# Patient Record
Sex: Male | Born: 1965 | Race: White | Hispanic: No | Marital: Married | Smoking: Never smoker
Health system: Southern US, Community
[De-identification: ages and names within clinical notes are randomized; demographics above are authoritative.]

## PROBLEM LIST (undated history)

## (undated) DIAGNOSIS — K219 Gastro-esophageal reflux disease without esophagitis: Secondary | ICD-10-CM

## (undated) DIAGNOSIS — Z85828 Personal history of other malignant neoplasm of skin: Secondary | ICD-10-CM

## (undated) DIAGNOSIS — Z86010 Personal history of colonic polyps: Secondary | ICD-10-CM

## (undated) DIAGNOSIS — F419 Anxiety disorder, unspecified: Secondary | ICD-10-CM

## (undated) DIAGNOSIS — Z9289 Personal history of other medical treatment: Secondary | ICD-10-CM

## (undated) DIAGNOSIS — Z9889 Other specified postprocedural states: Secondary | ICD-10-CM

## (undated) DIAGNOSIS — I10 Essential (primary) hypertension: Secondary | ICD-10-CM

## (undated) DIAGNOSIS — E785 Hyperlipidemia, unspecified: Secondary | ICD-10-CM

## (undated) DIAGNOSIS — M1990 Unspecified osteoarthritis, unspecified site: Secondary | ICD-10-CM

## (undated) DIAGNOSIS — N529 Male erectile dysfunction, unspecified: Secondary | ICD-10-CM

## (undated) DIAGNOSIS — F329 Major depressive disorder, single episode, unspecified: Secondary | ICD-10-CM

## (undated) DIAGNOSIS — K589 Irritable bowel syndrome without diarrhea: Secondary | ICD-10-CM

## (undated) DIAGNOSIS — Z85820 Personal history of malignant melanoma of skin: Secondary | ICD-10-CM

## (undated) DIAGNOSIS — K643 Fourth degree hemorrhoids: Secondary | ICD-10-CM

## (undated) DIAGNOSIS — F32A Depression, unspecified: Secondary | ICD-10-CM

## (undated) DIAGNOSIS — K5730 Diverticulosis of large intestine without perforation or abscess without bleeding: Secondary | ICD-10-CM

## (undated) DIAGNOSIS — Z860101 Personal history of adenomatous and serrated colon polyps: Secondary | ICD-10-CM

## (undated) HISTORY — DX: Major depressive disorder, single episode, unspecified: F32.9

## (undated) HISTORY — DX: Gastro-esophageal reflux disease without esophagitis: K21.9

## (undated) HISTORY — DX: Diverticulosis of large intestine without perforation or abscess without bleeding: K57.30

## (undated) HISTORY — DX: Depression, unspecified: F32.A

## (undated) HISTORY — DX: Male erectile dysfunction, unspecified: N52.9

## (undated) HISTORY — PX: OTHER SURGICAL HISTORY: SHX169

## (undated) HISTORY — PX: COLONOSCOPY: SHX174

## (undated) HISTORY — DX: Essential (primary) hypertension: I10

## (undated) HISTORY — DX: Anxiety disorder, unspecified: F41.9

## (undated) HISTORY — DX: Irritable bowel syndrome, unspecified: K58.9

## (undated) HISTORY — DX: Hyperlipidemia, unspecified: E78.5

## (undated) HISTORY — PX: MOHS SURGERY: SHX181

---

## 2000-11-04 ENCOUNTER — Encounter: Admission: RE | Admit: 2000-11-04 | Discharge: 2000-11-04 | Payer: Self-pay | Admitting: Family Medicine

## 2000-11-04 ENCOUNTER — Encounter: Payer: Self-pay | Admitting: Family Medicine

## 2006-06-09 DIAGNOSIS — Z9289 Personal history of other medical treatment: Secondary | ICD-10-CM

## 2006-06-09 HISTORY — DX: Personal history of other medical treatment: Z92.89

## 2011-04-29 ENCOUNTER — Encounter: Payer: Self-pay | Admitting: Family Medicine

## 2011-04-29 ENCOUNTER — Ambulatory Visit (INDEPENDENT_AMBULATORY_CARE_PROVIDER_SITE_OTHER): Payer: BC Managed Care – PPO | Admitting: Family Medicine

## 2011-04-29 ENCOUNTER — Other Ambulatory Visit: Payer: Self-pay | Admitting: Family Medicine

## 2011-04-29 VITALS — BP 158/85 | HR 79 | Temp 98.5°F | Ht 70.0 in | Wt 161.8 lb

## 2011-04-29 DIAGNOSIS — K642 Third degree hemorrhoids: Secondary | ICD-10-CM | POA: Insufficient documentation

## 2011-04-29 DIAGNOSIS — D72829 Elevated white blood cell count, unspecified: Secondary | ICD-10-CM

## 2011-04-29 DIAGNOSIS — K219 Gastro-esophageal reflux disease without esophagitis: Secondary | ICD-10-CM

## 2011-04-29 DIAGNOSIS — C801 Malignant (primary) neoplasm, unspecified: Secondary | ICD-10-CM

## 2011-04-29 DIAGNOSIS — R3911 Hesitancy of micturition: Secondary | ICD-10-CM

## 2011-04-29 DIAGNOSIS — C4490 Unspecified malignant neoplasm of skin, unspecified: Secondary | ICD-10-CM | POA: Insufficient documentation

## 2011-04-29 DIAGNOSIS — F341 Dysthymic disorder: Secondary | ICD-10-CM

## 2011-04-29 DIAGNOSIS — R030 Elevated blood-pressure reading, without diagnosis of hypertension: Secondary | ICD-10-CM

## 2011-04-29 DIAGNOSIS — Z0000 Encounter for general adult medical examination without abnormal findings: Secondary | ICD-10-CM

## 2011-04-29 DIAGNOSIS — R319 Hematuria, unspecified: Secondary | ICD-10-CM

## 2011-04-29 DIAGNOSIS — E782 Mixed hyperlipidemia: Secondary | ICD-10-CM | POA: Insufficient documentation

## 2011-04-29 DIAGNOSIS — K649 Unspecified hemorrhoids: Secondary | ICD-10-CM

## 2011-04-29 DIAGNOSIS — E785 Hyperlipidemia, unspecified: Secondary | ICD-10-CM | POA: Insufficient documentation

## 2011-04-29 DIAGNOSIS — F419 Anxiety disorder, unspecified: Secondary | ICD-10-CM

## 2011-04-29 DIAGNOSIS — F329 Major depressive disorder, single episode, unspecified: Secondary | ICD-10-CM

## 2011-04-29 DIAGNOSIS — K625 Hemorrhage of anus and rectum: Secondary | ICD-10-CM

## 2011-04-29 DIAGNOSIS — R61 Generalized hyperhidrosis: Secondary | ICD-10-CM

## 2011-04-29 HISTORY — DX: Unspecified hemorrhoids: K64.9

## 2011-04-29 LAB — POCT URINALYSIS DIPSTICK
Bilirubin, UA: NEGATIVE
Nitrite, UA: NEGATIVE
Protein, UA: NEGATIVE
Urobilinogen, UA: 0.2
pH, UA: 6

## 2011-04-29 LAB — GLUCOSE, POCT (MANUAL RESULT ENTRY): POC Glucose: 95

## 2011-04-29 MED ORDER — RANITIDINE HCL 300 MG PO TABS: 300.0000 mg | ORAL_TABLET | Freq: Every day | ORAL | Status: DC

## 2011-04-29 MED ORDER — ASPIRIN 81 MG PO TABS: 81.0000 mg | ORAL_TABLET | Freq: Every day | ORAL | Status: DC

## 2011-04-29 NOTE — Patient Instructions (Signed)
Preventative Care for Adults, Male A healthy lifestyle and preventative care can promote health and wellness. Preventative health guidelines for men include the following key practices:  A routine yearly physical is a good way to check with your caregiver about your health and preventative screening. It is a chance to share any concerns and updates on your health, and to receive a thorough exam.   Visit your dentist for a routine exam and preventative care every 6 months. Brush your teeth twice a day and floss once a day. Good oral hygiene prevents tooth decay and gum disease.   The frequency of eye exams is based on your age, health, family medical history, use of contact lenses, and other factors. Follow your caregiver's recommendations for frequency of eye exams.   Eat a healthy diet. Foods like vegetables, fruits, whole grains, low-fat dairy products, and lean protein foods contain the nutrients you need without too many calories. Decrease your intake of foods high in solid fats, added sugars, and salt. Eat the right amount of calories for you.Get information about a proper diet from your caregiver, if necessary.   Regular physical exercise is one of the most important things you can do for your health. Most adults should get at least 150 minutes of moderate-intensity exercise (any activity that increases your heart rate and causes you to sweat) each week. In addition, most adults need muscle-strengthening exercises on 2 or more days a week.   Maintain a healthy weight. The body mass index (BMI) is a screening tool to identify possible weight problems. It provides an estimate of body fat based on height and weight. Your caregiver can help determine your BMI, and can help you achieve or maintain a healthy weight.For adults 20 years and older:   A BMI below 18.5 is considered underweight.   A BMI of 18.5 to 24.9 is normal.   A BMI of 25 to 29.9 is considered overweight.   A BMI of 30 and  above is considered obese.   Maintain normal blood lipids and cholesterol levels by exercising and minimizing your intake of saturated fat. Eat a balanced diet with plenty of fruit and vegetables. Blood tests for lipids and cholesterol should begin at age 20 and be repeated every 5 years. If your lipid or cholesterol levels are high, you are over 50, or you are a high risk for heart disease, you may need your cholesterol levels checked more frequently.Ongoing high lipid and cholesterol levels should be treated with medicines if diet and exercise are not effective.   If you smoke, find out from your caregiver how to quit. If you do not use tobacco, do not start.   If you choose to drink alcohol, do not exceed 2 drinks per day. One drink is considered to be 12 ounces (355 mL) of beer, 5 ounces (148 mL) of wine, or 1.5 ounces (44 mL) of liquor.   Avoid use of street drugs. Do not share needles with anyone. Ask for help if you need support or instructions about stopping the use of drugs.   High blood pressure causes heart disease and increases the risk of stroke. Your blood pressure should be checked at least every 1 to 2 years. Ongoing high blood pressure should be treated with medicines, if weight loss and exercise are not effective.   If you are 45 to 47 years old, ask your caregiver if you should take aspirin to prevent heart disease.   Diabetes screening involves taking a blood   sample to check your fasting blood sugar level. This should be done once every 3 years, after age 45, if you are within normal weight and without risk factors for diabetes. Testing should be considered at a younger age or be carried out more frequently if you are overweight and have at least 1 risk factor for diabetes.   Colorectal cancer can be detected and often prevented. Most routine colorectal cancer screening begins at the age of 50 and continues through age 75. However, your caregiver may recommend screening at an  earlier age if you have risk factors for colon cancer. On a yearly basis, your caregiver may provide home test kits to check for hidden blood in the stool. Use of a small camera at the end of a tube, to directly examine the colon (sigmoidoscopy or colonoscopy), can detect the earliest forms of colorectal cancer. Talk to your caregiver about this at age 50, when routine screening begins. Direct examination of the colon should be repeated every 5 to 10 years through age 75, unless early forms of pre-cancerous polyps or small growths are found.   Practice safe sex. Use condoms and avoid high-risk sexual practices to reduce the spread of sexually transmitted infections (STIs). STIs include gonorrhea, chlamydia, syphilis, trichomonas, herpes, HPV, and human immunodeficiency virus (HIV). Herpes, HIV, and HPV are viral illnesses that have no cure. They can result in disability, cancer, and death.   A one-time screening for abdominal aortic aneurysm (AAA) and surgical repair of large AAAs by sound wave imaging (ultrasonography) is recommended for ages 65 to 75 years who are current or former smokers.   Healthy men should no longer receive prostate-specific antigen (PSA) blood tests as part of routine cancer screening. Consult with your caregiver about prostate cancer screening.   Use sunscreen with skin protection factor (SPF) of 30 or more. Apply sunscreen liberally and repeatedly throughout the day. You should seek shade when your shadow is shorter than you. Protect yourself by wearing long sleeves, pants, a wide-brimmed hat, and sunglasses year round, whenever you are outdoors.   Once a month, do a whole body skin exam, using a mirror to look at the skin on your back. Notify your caregiver of new moles, moles that have irregular borders, moles that are larger than a pencil eraser, or moles that have changed in shape or color.   Stay current with required immunizations.   Influenza. You need a dose every  fall (or winter). The composition of the flu vaccine changes each year, so being vaccinated once is not enough.   Pneumococcal polysaccharide. You need 1 to 2 doses if you smoke cigarettes or if you have certain chronic medical conditions. You need 1 dose at age 65 (or older) if you have never been vaccinated.   Tetanus, diphtheria, pertussis (Tdap, Td). Get 1 dose of Tdap vaccine if you are younger than age 65 years, are over 65 and have contact with an infant, are a healthcare worker, or simply want to be protected from whooping cough. After that, you need a Td booster dose every 10 years. Consult your caregiver if you have not had at least 3 tetanus and diphtheria-containing shots sometime in your life or have a deep or dirty wound.   HPV. This vaccine is recommended for males 13 through 47 years of age. This vaccine may be given to men 22 through 47 years of age who have not completed the 3 dose series. It is recommended for men through age 26   who have sex with men or whose immune system is weakened because of HIV infection, other illness, or medications. The vaccine is given in 3 doses over 6 months.   Measles, mumps, rubella (MMR). You need at least 1 dose of MMR if you were born in 1957 or later. You may also need a 2nd dose.   Meningococcal. If you are age 19 to 21 years and a first-year college student living in a residence hall, or have one of several medical conditions, you need to get vaccinated against meningococcal disease. You may also need additional booster doses.   Zoster (shingles). If you are age 60 years or older, you should get this vaccine.   Varicella (chickenpox). If you have never had chickenpox or you were vaccinated but received only 1 dose, talk to your caregiver to find out if you need this vaccine.   Hepatitis A. You need this vaccine if you have a specific risk factor for hepatitis A virus infection, or you simply wish to be protected from this disease. The vaccine is  usually given as 2 doses, 6 to 18 months apart.   Hepatitis B. You need this vaccine if you have a specific risk factor for hepatitis B virus infection or you simply wish to be protected from this disease. The vaccine is given in 3 doses, usually over 6 months.  Preventative Service / Frequency Ages 19 to 39  Blood pressure check.** / Every 1 to 2 years.   Lipid and cholesterol check.**/ Every 5 years beginning at age 20.   Skin self-exam. / Monthly.   Influenza immunization.** / Every year.   Pneumococcal polysaccharide immunization.** / 1 to 2 doses if you smoke cigarettes or if you have certain chronic medical conditions.   Tetanus, diphtheria, pertussis (Tdap,Td) immunization. / A one-time dose of Tdap vaccine. After that, you need a Td booster dose every 10 years.   HPV immunization. / 3 doses over 6 months, if 26 and younger.   Measles, mumps, rubella (MMR) immunization. / You need at least 1 dose of MMR if you were born in 1957 or later. You may also need a 2nd dose.   Meningococcal immunization. / 1 dose if you are age 19 to 21 years and a first-year college student living in a residence hall, or have one of several medical conditions, you need to get vaccinated against meningococcal disease. You may also need additional booster doses.   Varicella immunization. **/ Consult your caregiver.   Hepatitis A immunization. ** / Consult your caregiver. 2 doses, 6 to 18 months apart.   Hepatitis B immunization.** / Consult your caregiver. 3 doses usually over 6 months.  Ages 40 to 64  Blood pressure check.** / Every 1 to 2 years.   Lipid and cholesterol check.**/ Every 5 years beginning at age 20.   Fecal occult blood test (FOBT) of stool. / Every year beginning at age 50 and continuing until age 75. You may not have to do this test if you get colonoscopy every 10 years.   Flexible sigmoidoscopy** or colonoscopy.** / Every 5 years for a flexible sigmoidoscopy or every 10 years for  a colonoscopy beginning at age 50 and continuing until age 75.   Skin self-exam. / Monthly.   Influenza immunization.** / Every year.   Pneumococcal polysaccharide immunization.** / 1 to 2 doses if you smoke cigarettes or if you have certain chronic medical conditions.   Tetanus, diphtheria, pertussis (Tdap/Td) immunization.** / A one-time dose of   Tdap vaccine. After that, you need a Td booster dose every 10 years.   Measles, mumps, rubella (MMR) immunization. / You need at least 1 dose of MMR if you were born in 1957 or later. You may also need a 2nd dose.   Varicella immunization. **/ Consult your caregiver.   Meningococcal immunization.** / Consult your caregiver.   Hepatitis A immunization. ** / Consult your caregiver. 2 doses, 6 to 18 months apart.   Hepatitis B immunization.** / Consult your caregiver. 3 doses, usually over 6 months.  Ages 65 and over  Blood pressure check.** / Every 1 to 2 years.   Lipid and cholesterol check.**/ Every 5 years beginning at age 20.   Fecal occult blood test (FOBT) of stool. / Every year beginning at age 50 and continuing until age 75. You may not have to do this test if you get colonoscopy every 10 years.   Flexible sigmoidoscopy** or colonoscopy.** / Every 5 years for a flexible sigmoidoscopy or every 10 years for a colonoscopy beginning at age 50 and continuing until age 75.   Abdominal aortic aneurysm (AAA) screening.** / A one-time screening for ages 65 to 75 years who are current or former smokers.   Skin self-exam. / Monthly.   Influenza immunization.** / Every year.   Pneumococcal polysaccharide immunization.** / 1 dose at age 65 (or older) if you have never been vaccinated.   Tetanus, diphtheria, pertussis (Tdap, Td) immunization. / A one-time dose of Tdap vaccine if you are over 65 and have contact with an infant, are a healthcare worker, or simply want to be protected from whooping cough. After that, you need a Td booster dose  every 10 years.   Varicella immunization. **/ Consult your caregiver.   Meningococcal immunization.** / Consult your caregiver.   Hepatitis A immunization. ** / Consult your caregiver. 2 doses, 6 to 18 months apart.   Hepatitis B immunization.** / Check with your caregiver. 3 doses, usually over 6 months.  **Family history and personal history of risk and conditions may change your caregiver's recommendations. Document Released: 05/07/2001 Document Revised: 11/21/2010 Document Reviewed: 08/06/2010 [employer] Patient Information 2012 [employer]. 

## 2011-04-30 ENCOUNTER — Encounter: Payer: Self-pay | Admitting: Family Medicine

## 2011-04-30 DIAGNOSIS — R350 Frequency of micturition: Secondary | ICD-10-CM | POA: Insufficient documentation

## 2011-04-30 DIAGNOSIS — R319 Hematuria, unspecified: Secondary | ICD-10-CM | POA: Insufficient documentation

## 2011-04-30 LAB — CBC
Hemoglobin: 15.9 g/dL (ref 13.0–17.0)
MCH: 32.2 pg (ref 26.0–34.0)
Platelets: 277 10*3/uL (ref 150–400)
RBC: 4.94 MIL/uL (ref 4.22–5.81)
WBC: 11 10*3/uL — ABNORMAL HIGH (ref 4.0–10.5)

## 2011-04-30 LAB — HEPATIC FUNCTION PANEL
AST: 16 U/L (ref 0–37)
Albumin: 5.1 g/dL (ref 3.5–5.2)
Alkaline Phosphatase: 85 U/L (ref 39–117)
Total Protein: 7.5 g/dL (ref 6.0–8.3)

## 2011-04-30 LAB — LIPID PANEL
Cholesterol: 345 mg/dL — ABNORMAL HIGH (ref 0–200)
HDL: 34 mg/dL — ABNORMAL LOW (ref >39)

## 2011-04-30 LAB — SEDIMENTATION RATE: Sed Rate: 4 mm/hr (ref 0–16)

## 2011-04-30 LAB — BASIC METABOLIC PANEL
CO2: 21 mEq/L (ref 19–32)
Chloride: 101 mEq/L (ref 96–112)
Creat: 1 mg/dL (ref 0.50–1.35)
Potassium: 5.2 mEq/L (ref 3.5–5.3)
Sodium: 140 mEq/L (ref 135–145)

## 2011-04-30 LAB — H. PYLORI ANTIBODY, IGG: H Pylori IgG: 0.49 {ISR}

## 2011-04-30 MED ORDER — CIPROFLOXACIN HCL 500 MG PO TABS: 500.0000 mg | ORAL_TABLET | Freq: Two times a day (BID) | ORAL | Status: AC

## 2011-04-30 NOTE — Progress Notes (Signed)
Patient ID: Aaron Dyer, male   DOB: 1966-01-12, 46 y.o.   MRN: [id number] EVERTT CHOUINARD [id number] [birth year] 04/30/2011      Progress Note New Patient  Subjective  Chief Complaint  Chief Complaint  Patient presents with  . Establish Care    new patient    HPI  She is a 46 year old Caucasian male in today for new patient appt. He has been struggling with worsening anxiety depression for the last couple years. He was laid off and also injured his left finger in a work-related injury. He has gotten more and more depressed for work and been unable to find it. 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 He cries easily and is irritable frequently. His wife is with him and confirms. He reports having a lot of interest in activities and sense of hopelessness and uselessness but denies active suicidal ideation and or any suicide plan. He is also complaining of several weeks to several months of worsening urinary hesitancy and decreased stream. Notes some bright red blood per rectum at times not necessarily after straining. In fact he has often several loose stools daily but denies melanotic stool. Denies anorexia but does complain of some night sweats worsening over the last 5 days. No dysuria or hematuria. No chest pain, palpitations, shortness of breath. He does have reflux symptoms despite PPI.  Past Medical History  Diagnosis Date  . Chicken pox as a child  . Mumps as a child  . Anxiety and depression   . Hyperlipidemia   . Cancer     face, arms, legs, and ear, SCC  . Reflux 04/29/2011  . Rectal bleeding 04/29/2011  . Urinary hesitancy 04/30/2011  . Hematuria 04/30/2011    Past Surgical History  Procedure Date  . Remove squamous cell     b/l legs  . Mohs surgery 2010    right ear, twice    Family History  Problem Relation Age of Onset  . COPD Mother   . Emphysema Mother   . Stroke Mother     X 2  . Heart disease Mother     open heart surgery  .  Hyperlipidemia Mother   . Hypertension Mother   . Diabetes Mother     type 2  . Cancer Father     melanoma  . Stroke Father     34  . Hypertension Father   . Alcohol abuse Father   . Diabetes Maternal Grandmother     type 2  . Heart disease Maternal Grandmother     CHF  . Heart attack Maternal Grandfather   . Heart disease Paternal Grandmother   . Heart disease Paternal Grandfather     History   Social History  . Marital Status: Married    Spouse Name: N/A    Number of Children: N/A  . Years of Education: N/A   Occupational History  . Not on file.   Social History Main Topics  . Smoking status: Never Smoker   . Smokeless tobacco: Never Used  . Alcohol Use: Yes     occasionally  . Drug Use: No  . Sexually Active: Yes   Other Topics Concern  . Not on file   Social History Narrative  . No narrative on file    No current outpatient prescriptions on file prior to visit.    No Known Allergies  Review of Systems  Review of Systems  Constitutional: Negative for fever, chills  and malaise/fatigue.  HENT: Negative for hearing loss, nosebleeds and congestion.   Eyes: Negative for discharge.  Respiratory: Negative for cough, sputum production, shortness of breath and wheezing.   Cardiovascular: Negative for chest pain, palpitations and leg swelling.  Gastrointestinal: Positive for heartburn, diarrhea and blood in stool. Negative for nausea, vomiting, abdominal pain and constipation.  Genitourinary: Negative for dysuria, urgency, frequency and hematuria.       Urinary hesitancy and decreased stream over past couple of months.  Musculoskeletal: Negative for myalgias, back pain and falls.  Skin: Negative for rash.  Neurological: Negative for dizziness, tremors, sensory change, focal weakness, loss of consciousness, weakness and headaches.  Endo/Heme/Allergies: Negative for polydipsia. Does not bruise/bleed easily.  Psychiatric/Behavioral: Positive for depression.  Negative for suicidal ideas, hallucinations and substance abuse. The patient is nervous/anxious and has insomnia.     Objective  BP 158/85  Pulse 79  Temp(Src) 98.5 F (36.9 C) (Temporal)  Ht 5\' 10"  (1.778 m)  Wt 161 lb 12.8 oz (73.392 kg)  BMI 23.22 kg/m2  SpO2 98%  Physical Exam  Physical Exam  Constitutional: He is oriented to person, place, and time and well-developed, well-nourished, and in no distress. No distress.  HENT:  Head: Normocephalic and atraumatic.  Eyes: Conjunctivae are normal.  Neck: Neck supple. No thyromegaly present.  Cardiovascular: Normal rate, regular rhythm and normal heart sounds.   No murmur heard. Pulmonary/Chest: Effort normal and breath sounds normal. No respiratory distress.  Abdominal: He exhibits no distension and no mass. There is no tenderness.  Musculoskeletal: He exhibits no edema.  Neurological: He is alert and oriented to person, place, and time.  Skin: Skin is warm.  Psychiatric: Memory, affect and judgment normal.       Assessment & Plan  Hematuria Urine sent for culture  Urinary hesitancy Concern for acute prostatitis, will try treating with ciprofloxacin to help symptoms  Rectal bleeding Agrees to referral to GI for scoping with his combination of reflux and rectal bleeding he would benefit from upper and lower endoscopy  Hyperlipidemia Very hi will require medications upon return visit. Encouraged to avoid trans fats and to start a fish oil supplement  Cancer Encouraged to continue his q 6 month visits with Dr Terri Piedra  Anxiety and depression Denies suicidal ideation but hs multiple stressors including lack of work and caretaker to his ailing mother. He has established care with a psychologist and starts with a psychaitrist later this week he is encouraged to keep those appointments for now.  Reflux Will add Ranitidine to his Nexium daily while he awaits consultation with GI

## 2011-04-30 NOTE — Assessment & Plan Note (Signed)
Agrees to referral to GI for scoping with his combination of reflux and rectal bleeding he would benefit from upper and lower endoscopy

## 2011-04-30 NOTE — Assessment & Plan Note (Signed)
Will add Ranitidine to his Nexium daily while he awaits consultation with GI

## 2011-04-30 NOTE — Assessment & Plan Note (Signed)
Urine sent for culture

## 2011-04-30 NOTE — Assessment & Plan Note (Signed)
Encouraged to continue his q 6 month visits with Dr Terri Piedra

## 2011-04-30 NOTE — Assessment & Plan Note (Signed)
Concern for acute prostatitis, will try treating with ciprofloxacin to help symptoms

## 2011-04-30 NOTE — Assessment & Plan Note (Signed)
Very hi will require medications upon return visit. Encouraged to avoid trans fats and to start a fish oil supplement

## 2011-04-30 NOTE — Assessment & Plan Note (Signed)
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 He has established care with a psychologist and starts with a psychaitrist later this week he is encouraged to keep those appointments for now.

## 2011-05-01 ENCOUNTER — Telehealth: Payer: Self-pay

## 2011-05-01 LAB — URINE CULTURE: Colony Count: NO GROWTH

## 2011-05-01 NOTE — Telephone Encounter (Signed)
Patient informed and states understandment

## 2011-05-01 NOTE — Telephone Encounter (Signed)
Message copied by Court Joy on Wed May 01, 2011 11:35 AM ------      Message from: Danise Edge A      Created: Tue Apr 30, 2011  9:55 PM       I sent in a prescription for Ciprofloxacin and he should take a probiotic  For the next 3 weeks with his WBCs up, hematuria and his symptoms would treat a prostatitis and see if he improves. Please notify

## 2011-05-01 NOTE — Progress Notes (Signed)
Patient advised and states he would like to wait on the statin until he comes in for his appt? Pt also advised that his letter from [facility] is available for pickup. Pt stated his wife would be in later today to pick it up

## 2011-05-02 NOTE — Telephone Encounter (Signed)
Have him start Lovastatin 10 mg 1 tab po qhs, disp # 30 , 3 rf, check lft in 1 month and flp and lft in 3-4 months

## 2011-05-02 NOTE — Telephone Encounter (Signed)
RC from pt wife regarding earlier message.  Advised cipro is for probable prostatitis.  They are agreeable.  Also patient states they are OK with starting statin.  [facility] [address]

## 2011-05-03 MED ORDER — LOVASTATIN 10 MG PO TABS: 10.0000 mg | ORAL_TABLET | Freq: Every day | ORAL | Status: DC

## 2011-05-03 NOTE — Telephone Encounter (Signed)
Pt informed

## 2011-05-03 NOTE — Telephone Encounter (Signed)
Addended by: Court Joy on: 05/03/2011 09:32 AM   Modules accepted: Orders

## 2011-05-06 ENCOUNTER — Encounter: Payer: Self-pay | Admitting: Internal Medicine

## 2011-05-10 ENCOUNTER — Encounter: Payer: Self-pay | Admitting: Internal Medicine

## 2011-05-13 ENCOUNTER — Telehealth: Payer: Self-pay | Admitting: Family Medicine

## 2011-05-13 NOTE — Telephone Encounter (Signed)
Patient forgot to get stool sample kit at appt. Also patient wants to know if he should keep taking Welbutrin, if yes, he will need an Rx

## 2011-05-14 MED ORDER — BUPROPION HCL ER (XL) 150 MG PO TB24: 150.0000 mg | ORAL_TABLET | Freq: Every day | ORAL | Status: DC

## 2011-05-14 NOTE — Telephone Encounter (Signed)
Please advise 

## 2011-05-14 NOTE — Telephone Encounter (Signed)
He can continue the Wellbutrin 150 mg if he thinks it is helping, 1 tab po daily, #30 with 3 rf

## 2011-05-14 NOTE — Telephone Encounter (Signed)
Please have him p/u a stool kit, to check for hemeoccult

## 2011-05-14 NOTE — Telephone Encounter (Signed)
Left a detailed message on patients voicemail. Wellbutrin called into pharmacy. Hemoccult cards put at front desk for patient to pick up.

## 2011-05-15 ENCOUNTER — Ambulatory Visit (INDEPENDENT_AMBULATORY_CARE_PROVIDER_SITE_OTHER): Payer: BC Managed Care – PPO | Admitting: Internal Medicine

## 2011-05-15 ENCOUNTER — Encounter: Payer: Self-pay | Admitting: Internal Medicine

## 2011-05-15 DIAGNOSIS — K625 Hemorrhage of anus and rectum: Secondary | ICD-10-CM

## 2011-05-15 DIAGNOSIS — R1013 Epigastric pain: Secondary | ICD-10-CM

## 2011-05-15 DIAGNOSIS — K219 Gastro-esophageal reflux disease without esophagitis: Secondary | ICD-10-CM

## 2011-05-15 MED ORDER — ALIGN 4 MG PO CAPS: 1.0000 | ORAL_CAPSULE | Freq: Every day | ORAL | Status: DC

## 2011-05-15 MED ORDER — PEG-KCL-NACL-NASULF-NA ASC-C 100 G PO SOLR: 1.0000 | Freq: Once | ORAL | Status: DC

## 2011-05-15 NOTE — Progress Notes (Signed)
Subjective:    Patient ID: Aaron Dyer, male    DOB: 1965-12-24, 46 y.o.   MRN: [id number]  HPI Mr. Nhan is a 46 year old male with a past medical history of anxiety and depression, hyperlipidemia, GERD who seen in consultation at the request of Dr. Abner Greenspan for evaluation of reflux and rectal bleeding. Patient reports years of issues with heartburn and reflux disease. This continues to be an issue, on a daily basis. He reports burning epigastric and chest pain which is worse after eating. Occasionally he has nausea but no vomiting. He also occasionally has epigastric pain which is worse after eating. He has been on omeprazole 40 mg daily and recently started taking ranitidine 300 mg at bedtime. He cannot tell a significant difference in his symptoms. He does recall when he was on the brand-name, Prilosec, he tended to have an much fewer reflux symptoms. He denies dysphagia and odynophagia. No weight loss and a good appetite. He does report bowel movements which are somewhat erratic, and this has been long-standing. He does report painless bright red rectal bleeding which has occurred intermittently over the last few months. He sees this blood on the toilet tissue and occasionally in the toilet water. He denies melena. No fevers or chills  Review of Systems As per history of present illness, and notable for anxiety, depression, fatigue, muscle cramps, and difficulty sleeping, otherwise negative  Patient Active Problem List  Diagnoses  . Anxiety and depression  . Cancer - sq cell skin cancer  . Hyperlipidemia  . Reflux  . Rectal bleeding  . Urinary hesitancy  . Hematuria   Past Surgical History  Procedure Date  . Remove squamous cell     b/l legs  . Mohs surgery 2010    right ear, twice   Current Outpatient Prescriptions  Medication Sig Dispense Refill  . aspirin 81 MG tablet Take 1 tablet (81 mg total) by mouth daily.  30 tablet  0  . buPROPion (WELLBUTRIN XL) 150 MG 24 hr tablet Take  1 tablet (150 mg total) by mouth daily.  30 tablet  3  . ciprofloxacin (CIPRO) 500 MG tablet Take 500 mg by mouth 2 (two) times daily.      . fish oil-omega-3 fatty acids 1000 MG capsule Take 1 g by mouth daily.      Marland Kitchen FLUoxetine (PROZAC) 20 MG capsule Take 20 mg by mouth 3 (three) times daily.      Marland Kitchen lovastatin (MEVACOR) 10 MG tablet Take 1 tablet (10 mg total) by mouth at bedtime.  30 tablet  3  . omeprazole (PRILOSEC) 40 MG capsule Take 40 mg by mouth daily.      . ranitidine (ZANTAC) 300 MG tablet Take 1 tablet (300 mg total) by mouth at bedtime.  30 tablet  3  . temazepam (RESTORIL) 15 MG capsule Take 30 mg by mouth at bedtime as needed.      . peg 3350 powder (MOVIPREP) 100 G SOLR Take 1 kit (100 g total) by mouth once.  1 kit  0  . Probiotic Product (ALIGN) 4 MG CAPS Take 1 capsule by mouth daily.  30 capsule  0   No Known Allergies  Family History  Problem Relation Age of Onset  . COPD Mother   . Emphysema Mother   . Stroke Mother     X 2  . Heart disease Mother     open heart surgery  . Hyperlipidemia Mother   . Hypertension Mother   .  Diabetes Mother     type 2  . Cancer Father     melanoma  . Stroke Father     4  . Hypertension Father   . Alcohol abuse Father   . Diabetes Maternal Grandmother     type 2  . Heart disease Maternal Grandmother     CHF  . Heart attack Maternal Grandfather   . Heart disease Paternal Grandmother   . Heart disease Paternal Grandfather    History   Social History  . Marital Status: Married    Spouse Name: N/A    Number of Children: N/A  . Years of Education: N/A   Occupational History  . unemployed    Social History Main Topics  . Smoking status: Never Smoker   . Smokeless tobacco: Never Used  . Alcohol Use: Yes     occasionally  . Drug Use: No  . Sexually Active: Yes   Other Topics Concern  . None   Social History Narrative  . None       Objective:   Physical Exam BP 100/60  Pulse 80  Ht 5\' 9"  (1.753 m)   Wt 166 lb (75.297 kg)  BMI 24.51 kg/m2 Constitutional: Well-developed and well-nourished. No distress. HEENT: Normocephalic and atraumatic. Oropharynx is clear and moist. No oropharyngeal exudate. Conjunctivae are normal. Pupils are equal round and reactive to light. No scleral icterus. Neck: Neck supple. Trachea midline. Cardiovascular: Normal rate, regular rhythm and intact distal pulses. No M/R/G Pulmonary/chest: Effort normal and breath sounds normal. No wheezing, rales or rhonchi. Abdominal: Soft, nontender, nondistended. Bowel sounds active throughout. There are no masses palpable. No hepatosplenomegaly. Extremities: no clubbing, cyanosis, or edema Lymphadenopathy: No cervical adenopathy noted. Neurological: Alert and oriented to person place and time. Skin: Skin is warm and dry. No rashes noted. Psychiatric: Normal mood and affect. Behavior is normal.  CBC    Component Value Date/Time   WBC 11.0* 04/29/2011 1525   RBC 4.94 04/29/2011 1525   HGB 15.9 04/29/2011 1525   HCT 48.1 04/29/2011 1525   PLT 277 04/29/2011 1525   MCV 97.4 04/29/2011 1525   MCH 32.2 04/29/2011 1525   MCHC 33.1 04/29/2011 1525   RDW 14.6 04/29/2011 1525    CMP     Component Value Date/Time   NA 140 04/29/2011 1525   K 5.2 04/29/2011 1525   CL 101 04/29/2011 1525   CO2 21 04/29/2011 1525   GLUCOSE 91 04/29/2011 1525   BUN 13 04/29/2011 1525   CREATININE 1.00 04/29/2011 1525   CALCIUM 10.1 04/29/2011 1525   PROT 7.5 04/29/2011 1525   ALBUMIN 5.1 04/29/2011 1525   AST 16 04/29/2011 1525   ALT 18 04/29/2011 1525   ALKPHOS 85 04/29/2011 1525   BILITOT 0.2* 04/29/2011 1525       Assessment & Plan:   46 year old male with a past medical history of anxiety and depression, hyperlipidemia, GERD who seen in consultation at the request of Dr. Abner Greenspan for evaluation of reflux and rectal bleeding. Patient reports years of issues with heartburn and reflux disease.  1. GERD -- the patient does continue to have symptoms which are consistent with reflux  disease despite daily PPI therapy. He's recently added ranitidine at night which also doesn't seem to be helping much. Given his symptoms are long-standing and not completely responsive to PPI therapy, I recommended upper endoscopy. We discussed this test including the risks and benefits and he agrees to proceed. For now would like him to continue  with omeprazole 40 mg, but he has not been taking this 30 minutes to an hour before meal. Changing the administration of 30 minutes to one hour before meal may significantly help reduce his reflux symptoms. He will try this going forward. He can also continue to use ranitidine 300 mg as needed at bedtime for breakthrough symptoms. Further recommendations after endoscopy  2. Rectal bleeding -- given the patient's bright red rectal bleeding and his age of 43, I recommended further evaluation with colonoscopy. We discussed the risks and benefits of this test and he agrees to proceed. This may be related internal hemorrhoids, which can be treated if found at colonoscopy. He does seem that he has an erratic bowel pattern, and I recommended Align one capsule daily. He was taken another probiotic over-the-counter for the last week but has not yet been able to tell much difference.

## 2011-05-15 NOTE — Patient Instructions (Addendum)
You have been scheduled for a colonoscopy/Endoscopy with propofol. Please follow written instructions given to you at your visit today.  Please pick up your prep kit at the pharmacy within the next 1-3 days.  We have sent the following medications to your pharmacy for you to pick up at your convenience: movieprep, you have been given samples today of Align. Take 1 capsule daily.  Dr. Rhea Belton would like you to continue taking your reflux medications as directed.   Follow up with Dr. Rhea Belton 1 month after your procedure.

## 2011-05-20 ENCOUNTER — Telehealth: Payer: Self-pay | Admitting: Internal Medicine

## 2011-05-20 ENCOUNTER — Other Ambulatory Visit: Payer: Self-pay | Admitting: Gastroenterology

## 2011-05-20 DIAGNOSIS — R1013 Epigastric pain: Secondary | ICD-10-CM

## 2011-05-20 DIAGNOSIS — K219 Gastro-esophageal reflux disease without esophagitis: Secondary | ICD-10-CM

## 2011-05-20 DIAGNOSIS — K625 Hemorrhage of anus and rectum: Secondary | ICD-10-CM

## 2011-05-20 MED ORDER — PEG-KCL-NACL-NASULF-NA ASC-C 100 G PO SOLR: 1.0000 | Freq: Once | ORAL | Status: DC

## 2011-05-20 NOTE — Telephone Encounter (Signed)
Resent Moviprep to [facility] per pt's request.

## 2011-05-21 ENCOUNTER — Other Ambulatory Visit: Payer: BC Managed Care – PPO

## 2011-05-21 ENCOUNTER — Other Ambulatory Visit: Payer: Self-pay | Admitting: Gastroenterology

## 2011-05-21 DIAGNOSIS — R1013 Epigastric pain: Secondary | ICD-10-CM

## 2011-05-21 DIAGNOSIS — K625 Hemorrhage of anus and rectum: Secondary | ICD-10-CM

## 2011-05-21 DIAGNOSIS — Z1211 Encounter for screening for malignant neoplasm of colon: Secondary | ICD-10-CM

## 2011-05-21 DIAGNOSIS — K219 Gastro-esophageal reflux disease without esophagitis: Secondary | ICD-10-CM

## 2011-05-21 LAB — HEMOCCULT SLIDES (X 3 CARDS)
OCCULT 1: NEGATIVE
OCCULT 2: NEGATIVE

## 2011-05-21 MED ORDER — PEG-KCL-NACL-NASULF-NA ASC-C 100 G PO SOLR: 1.0000 | Freq: Once | ORAL | Status: DC

## 2011-05-23 ENCOUNTER — Encounter: Payer: Self-pay | Admitting: Internal Medicine

## 2011-05-23 ENCOUNTER — Ambulatory Visit (AMBULATORY_SURGERY_CENTER): Payer: BC Managed Care – PPO | Admitting: Internal Medicine

## 2011-05-23 DIAGNOSIS — K219 Gastro-esophageal reflux disease without esophagitis: Secondary | ICD-10-CM

## 2011-05-23 DIAGNOSIS — K635 Polyp of colon: Secondary | ICD-10-CM

## 2011-05-23 DIAGNOSIS — K625 Hemorrhage of anus and rectum: Secondary | ICD-10-CM

## 2011-05-23 DIAGNOSIS — D126 Benign neoplasm of colon, unspecified: Secondary | ICD-10-CM

## 2011-05-23 DIAGNOSIS — K2960 Other gastritis without bleeding: Secondary | ICD-10-CM

## 2011-05-23 MED ORDER — SODIUM CHLORIDE 0.9 % IV SOLN: 500.0000 mL | INTRAVENOUS | Status: DC

## 2011-05-23 NOTE — Progress Notes (Signed)
Patient did not experience any of the following events: a burn prior to discharge; a fall within the facility; wrong site/side/patient/procedure/implant event; or a hospital transfer or hospital admission upon discharge from the facility. (G8907) Patient did not have preoperative order for IV antibiotic SSI prophylaxis. (G8918)  

## 2011-05-23 NOTE — Patient Instructions (Signed)

## 2011-05-23 NOTE — Op Note (Signed)
[facility] [address] [place], [place]  [address]  COLONOSCOPY PROCEDURE REPORT  PATIENT:  Aaron Dyer, Aaron Dyer  MR#:  [id number] BIRTHDATE:  1965-11-21, 45 yrs. old  GENDER:  male ENDOSCOPIST:  Carie Caddy. Lodema Parma, MD REF. BY:  Reuel Derby, M.D. PROCEDURE DATE:  05/23/2011 PROCEDURE:  Colonoscopy with snare polypectomy, Colon with cold biopsy polypectomy ASA CLASS:  Class II INDICATIONS:  rectal bleeding MEDICATIONS:   MAC sedation, administered by CRNA, propofol (Diprivan) 450 mg IV  DESCRIPTION OF PROCEDURE:   After the risks benefits and alternatives of the procedure were thoroughly explained, informed consent was obtained.  Digital rectal exam was performed and revealed external hemorrhoids.   The LB 180AL [id number] endoscope was introduced through the anus and advanced to the cecum, which was identified by both the appendix and ileocecal valve, without limitations.  The quality of the prep was good, using MoviPrep. The instrument was then slowly withdrawn as the colon was fully examined. <<PROCEDUREIMAGES>>  FINDINGS:  A 5 mm sessile polyp was found in the recto-sigmoid colon. Polyp was snared without cautery. Retrieval was successful. An 8 mm sessile polyp was found in the rectum. Polyp was snared, then cauterized with monopolar cautery. Retrieval was successful. Three sessile polyps measuring 2 - 4 mm were found in the transverse colon (1) and descending colon (2). The polyps were removed using cold biopsy forceps.  Small internal hemorrhoids were found.   Retroflexed views in the rectum revealed no other findings other than those already described.  The scope was then withdrawn  from the cecum and the procedure completed.  COMPLICATIONS:  None  ENDOSCOPIC IMPRESSION: 1) Sessile polyp in the recto-sigmoid colon. Removed and sent to pathology 2) Sessile polyp in the rectum. Removed and sent to pathology 3) Three polyps in the transverse colon. Removed and sent  to pathology 4) Small external and internal hemorrhoids  RECOMMENDATIONS: 1) Hold aspirin, aspirin products, and anti-inflammatory medication for 2 weeks. 2) Await pathology results 3) If the polyps removed today are proven to be adenomatous (pre-cancerous) polyps, you will need a colonoscopy in 3 years. Otherwise you should continue to follow colorectal cancer screening guidelines for "routine risk" patients with a colonoscopy in 10 years. You will receive a letter within 1-2 weeks with the results of your biopsy as well as final recommendations. Please call my office if you have not received a letter after 3 weeks.  Carie Caddy. Rhea Belton, MD  CC:  Reuel Derby, MD The Patient  n. eSIGNEDCarie Caddy. Absalom Aro at 05/23/2011 05:06 PM  Herbie Drape, [phone]

## 2011-05-23 NOTE — Op Note (Signed)
[facility] [address] [place], [place]  [address]  ENDOSCOPY PROCEDURE REPORT  PATIENT:  Aaron, Dyer  MR#:  [id number] BIRTHDATE:  [birth year], 45 yrs. old  GENDER:  male ENDOSCOPIST:  Carie Caddy. Eduar Kumpf, MD Referred by:  Reuel Derby, M.D. PROCEDURE DATE:  05/23/2011 PROCEDURE:  EGD with biopsy, 432[address] ASA CLASS:  Class II INDICATIONS:  dyspepsia, epigastric pain MEDICATIONS:    MAC sedation, administered by CRNA, propofol (Diprivan) 450 mg IV TOPICAL ANESTHETIC:  none  DESCRIPTION OF PROCEDURE:   After the risks benefits and alternatives of the procedure were thoroughly explained, informed consent was obtained.  The LB [id number] [id number] endoscope was introduced through the mouth and advanced to the second portion of the duodenum, without limitations.  The instrument was slowly withdrawn as the mucosa was fully examined. <<PROCEDUREIMAGES>>  The esophagus and gastroesophageal junction were completely normal in appearance.  Mild gastritis was found antrum. Biopsies of the antrum and body of the stomach were obtained and sent to pathology.  The duodenal bulb was normal in appearance, as was the postbulbar duodenum.    Retroflexed views revealed no abnormalities.    The scope was then withdrawn from the patient and the procedure completed.  COMPLICATIONS:  None  ENDOSCOPIC IMPRESSION: 1) Normal esophagus 2) Mild gastritis in the antrum.  Biopsies performed and sent to pathology. 3) Normal duodenum  RECOMMENDATIONS: 1) Await pathology results 2) Continue current medications. 3) Follow-up of helicobacter pylori status, treat if indicated 4) Office follow-up in 1 month  Alvie Speltz M. Rhea Belton, MD  CC:  Reuel Derby, MD The Patient  n. eSIGNEDCarie Caddy. Lochlyn Zullo at 05/23/2011 04:46 PM  Aaron Dyer, [id number]

## 2011-05-24 ENCOUNTER — Encounter: Payer: Self-pay | Admitting: *Deleted

## 2011-05-24 ENCOUNTER — Telehealth: Payer: Self-pay | Admitting: *Deleted

## 2011-05-24 NOTE — Telephone Encounter (Signed)
Left message on number given in admitting yesterday as instructed. ewm

## 2011-05-27 ENCOUNTER — Ambulatory Visit: Payer: BC Managed Care – PPO | Admitting: Family Medicine

## 2011-05-28 ENCOUNTER — Encounter: Payer: Self-pay | Admitting: Family Medicine

## 2011-05-28 ENCOUNTER — Ambulatory Visit (INDEPENDENT_AMBULATORY_CARE_PROVIDER_SITE_OTHER): Payer: BC Managed Care – PPO | Admitting: Family Medicine

## 2011-05-28 VITALS — BP 139/85 | HR 77 | Temp 99.0°F | Ht 69.0 in | Wt 165.0 lb

## 2011-05-28 DIAGNOSIS — K635 Polyp of colon: Secondary | ICD-10-CM

## 2011-05-28 DIAGNOSIS — Z79899 Other long term (current) drug therapy: Secondary | ICD-10-CM

## 2011-05-28 DIAGNOSIS — IMO0001 Reserved for inherently not codable concepts without codable children: Secondary | ICD-10-CM

## 2011-05-28 DIAGNOSIS — C4490 Unspecified malignant neoplasm of skin, unspecified: Secondary | ICD-10-CM

## 2011-05-28 DIAGNOSIS — E785 Hyperlipidemia, unspecified: Secondary | ICD-10-CM

## 2011-05-28 DIAGNOSIS — F329 Major depressive disorder, single episode, unspecified: Secondary | ICD-10-CM

## 2011-05-28 DIAGNOSIS — C801 Malignant (primary) neoplasm, unspecified: Secondary | ICD-10-CM

## 2011-05-28 DIAGNOSIS — D126 Benign neoplasm of colon, unspecified: Secondary | ICD-10-CM

## 2011-05-28 DIAGNOSIS — D72829 Elevated white blood cell count, unspecified: Secondary | ICD-10-CM

## 2011-05-28 DIAGNOSIS — K219 Gastro-esophageal reflux disease without esophagitis: Secondary | ICD-10-CM

## 2011-05-28 DIAGNOSIS — K625 Hemorrhage of anus and rectum: Secondary | ICD-10-CM

## 2011-05-28 DIAGNOSIS — R319 Hematuria, unspecified: Secondary | ICD-10-CM

## 2011-05-28 DIAGNOSIS — R3911 Hesitancy of micturition: Secondary | ICD-10-CM

## 2011-05-28 DIAGNOSIS — F32A Depression, unspecified: Secondary | ICD-10-CM

## 2011-05-28 DIAGNOSIS — G4700 Insomnia, unspecified: Secondary | ICD-10-CM | POA: Insufficient documentation

## 2011-05-28 DIAGNOSIS — F419 Anxiety disorder, unspecified: Secondary | ICD-10-CM

## 2011-05-28 DIAGNOSIS — F341 Dysthymic disorder: Secondary | ICD-10-CM

## 2011-05-28 LAB — HEPATIC FUNCTION PANEL
Alkaline Phosphatase: 79 U/L (ref 39–117)
Bilirubin, Direct: 0.1 mg/dL (ref 0.0–0.3)
Total Protein: 7.5 g/dL (ref 6.0–8.3)

## 2011-05-28 LAB — POCT URINALYSIS DIPSTICK
Protein, UA: NEGATIVE
Spec Grav, UA: 1.02
Urobilinogen, UA: 0.2
pH, UA: 7.5

## 2011-05-28 LAB — CBC
Hemoglobin: 16 g/dL (ref 13.0–17.0)
MCHC: 34 g/dL (ref 30.0–36.0)
MCV: 95.1 fl (ref 78.0–100.0)
Platelets: 244 10*3/uL (ref 150.0–400.0)

## 2011-05-28 MED ORDER — RANITIDINE HCL 300 MG PO TABS: 300.0000 mg | ORAL_TABLET | Freq: Every day | ORAL | Status: DC

## 2011-05-28 MED ORDER — TEMAZEPAM 15 MG PO CAPS
30.0000 mg | ORAL_CAPSULE | Freq: Every evening | ORAL | Status: DC | PRN
Start: 2011-05-28 — End: 2011-09-12

## 2011-05-28 MED ORDER — FLUOXETINE HCL 20 MG PO CAPS: 20.0000 mg | ORAL_CAPSULE | Freq: Three times a day (TID) | ORAL | Status: DC

## 2011-05-28 NOTE — Assessment & Plan Note (Signed)
New lesion on left thigh, referred for further evaluation and excision

## 2011-05-28 NOTE — Assessment & Plan Note (Signed)
Colonoscopy revealed numerous none cancerous.

## 2011-05-28 NOTE — Patient Instructions (Signed)
Cholesterol Cholesterol is a white, waxy, fat-like protein needed by your body in small amounts. The liver makes all the cholesterol you need. It is carried from the liver by the blood through the blood vessels. Deposits (plaque) may build up on blood vessel walls. This makes the arteries narrower and stiffer. Plaque increases the risk for heart attack and stroke. You cannot feel your cholesterol level even if it is very high. The only way to know is by a blood test to check your lipid (fats) levels. Once you know your cholesterol levels, you should keep a record of the test results. Work with your caregiver to to keep your levels in the desired range. WHAT THE RESULTS MEAN:  Total cholesterol is a rough measure of all the cholesterol in your blood.   LDL is the so-called bad cholesterol. This is the type that deposits cholesterol in the walls of the arteries. You want this level to be low.   HDL is the good cholesterol because it cleans the arteries and carries the LDL away. You want this level to be high.   Triglycerides are fat that the body can either burn for energy or store. High levels are closely linked to heart disease.  DESIRED LEVELS:  Total cholesterol below 200.   LDL below 100 for people at risk, below 70 for very high risk.   HDL above 50 is good, above 60 is best.   Triglycerides below 150.  HOW TO LOWER YOUR CHOLESTEROL:  Diet.   Choose fish or white meat chicken and [place], roasted or baked. Limit fatty cuts of red meat, fried foods, and processed meats, such as sausage and lunch meat.   Eat lots of fresh fruits and vegetables. Choose whole grains, beans, pasta, potatoes and cereals.   Use only small amounts of olive, corn or canola oils. Avoid butter, mayonnaise, shortening or palm kernel oils. Avoid foods with trans-fats.   Use skim/nonfat milk and low-fat/nonfat yogurt and cheeses. Avoid whole milk, cream, ice cream, egg yolks and cheeses. Healthy desserts include  angel food cake, ginger snaps, animal crackers, hard candy, popsicles, and low-fat/nonfat frozen yogurt. Avoid pastries, cakes, pies and cookies.   Exercise.   A regular program helps decrease LDL and raises HDL.   Helps with weight control.   Do things that increase your activity level like gardening, walking, or taking the stairs.   Medication.   May be prescribed by your caregiver to help lowering cholesterol and the risk for heart disease.   You may need medicine even if your levels are normal if you have several risk factors.  HOME CARE INSTRUCTIONS   Follow your diet and exercise programs as suggested by your caregiver.   Take medications as directed.   Have blood work done when your caregiver feels it is necessary.  MAKE SURE YOU:   Understand these instructions.   Will watch your condition.   Will get help right away if you are not doing well or get worse.  Document Released: 12/04/2000 Document Revised: 02/28/2011 Dument Reviewed: 05/27/2007 ExitCare Patient Information 2012 [place], [place].  Minimize simple carbs and saturated fats (animal), no trans fats/partially hydrogenated oils, in breads, chips, fast food  Start a fatty acid like MegaRed caps 1 daily

## 2011-05-28 NOTE — Assessment & Plan Note (Signed)
Temazepam refill given today

## 2011-05-28 NOTE — Assessment & Plan Note (Signed)
Had upper endoscopy which confirmed gastritis, patient now taking a PPI routinely and doing better symptomatically

## 2011-05-28 NOTE — Assessment & Plan Note (Addendum)
Patient would like to consider a new counselor he is given paper work to consider [employer]. He agrees to contact them. Tolerating Wellbutrin, Fluoxetine, and Lithium no change in therapy today

## 2011-05-28 NOTE — Assessment & Plan Note (Signed)
No further episodes since his colonoscopy

## 2011-05-28 NOTE — Progress Notes (Signed)
Patient ID: Aaron Dyer, male   DOB: [birth year], 46 y.o.   MRN: [id number] Aaron Dyer [id number] 1965/12/02 05/28/2011      Progress Note-Follow Up  Subjective  Chief Complaint  Chief Complaint  Patient presents with  . Follow-up    1 month follow up    HPI  Patient is  45eaol [employer] in today for followup on his new patient appointment.since he was last seen he had an upper endos multiple polyps were found. He's had no further rectal bleeding since then. His upper endoscopy revealed gastritis. His reflux symptoms are improved on current medications. He has hadn't been commented last 2 nights has been out of Restoril. He is not currently having with his counselor and would like to try a different group his feelings of his medicines are helping or not. He denies suicidal ideation or significant anhedonia. He is also complaining of urinary hesitancy and difficulty initiating flow. Denies dysuria, leg pain, abdominal pain, fevers, chills, constipation, diarrhea, chest pain, polyp, shortness of breath.   Past Medical History  Diagnosis Date  . Chicken pox as a child  . Mumps as a child  . Anxiety and depression   . Hyperlipidemia   . Cancer     face, arms, legs, and ear, SCC  . Reflux 04/29/2011  . Rectal bleeding 04/29/2011  . Urinary hesitancy 04/30/2011  . Hematuria 04/30/2011  . IBS (irritable bowel syndrome)   . Depression   . GERD (gastroesophageal reflux disease)   . Colon polyps 05/28/2011  . Insomnia 05/28/2011    Past Surgical History  Procedure Date  . Remove squamous cell     b/l legs  . Mohs surgery 2010    right ear, twice    Family History  Problem Relation Age of Onset  . COPD Mother   . Emphysema Mother   . Stroke Mother     X 2  . Heart disease Mother     open heart surgery  . Hyperlipidemia Mother   . Hypertension Mother   . Diabetes Mother     type 2  . Cancer Father     melanoma  . Stroke Father     39  . Hypertension Father   .  Alcohol abuse Father   . Diabetes Maternal Grandmother     type 2  . Heart disease Maternal Grandmother     CHF  . Heart attack Maternal Grandfather   . Heart disease Paternal Grandmother   . Heart disease Paternal Grandfather     History   Social History  . Marital Status: Married    Spouse Name: N/A    Number of Children: N/A  . Years of Education: N/A   Occupational History  . unemployed    Social History Main Topics  . Smoking status: Never Smoker   . Smokeless tobacco: Never Used  . Alcohol Use: Yes     occasionally  . Drug Use: No  . Sexually Active: Yes   Other Topics Concern  . Not on file   Social History Narrative  . No narrative on file    Current Outpatient Prescriptions on File Prior to Visit  Medication Sig Dispense Refill  . buPROPion (WELLBUTRIN XL) 150 MG 24 hr tablet Take 1 tablet (150 mg total) by mouth daily.  30 tablet  3  . fish oil-omega-3 fatty acids 1000 MG capsule Take 1 g by mouth daily.      Marland Kitchen lovastatin (MEVACOR) 10 MG tablet  Take 1 tablet (10 mg total) by mouth at bedtime.  30 tablet  3  . omeprazole (PRILOSEC) 40 MG capsule Take 40 mg by mouth daily.      . Probiotic Product (ALIGN) 4 MG CAPS Take 1 capsule by mouth daily.  30 capsule  0    No Known Allergies  Review of Systems  Review of Systems  Constitutional: Negative for fever and malaise/fatigue.  HENT: Negative for congestion.   Eyes: Negative for discharge.  Respiratory: Negative for shortness of breath.   Cardiovascular: Negative for chest pain, palpitations and leg swelling.  Gastrointestinal: Positive for heartburn. Negative for nausea, abdominal pain, diarrhea, constipation, blood in stool and melena.  Genitourinary: Negative for dysuria, urgency, frequency, hematuria and flank pain.  Musculoskeletal: Negative for falls.  Skin: Negative for rash.  Neurological: Negative for loss of consciousness and headaches.  Endo/Heme/Allergies: Negative for polydipsia.    Psychiatric/Behavioral: Negative for depression, suicidal ideas, hallucinations and substance abuse. The patient is nervous/anxious and has insomnia.     Objective  BP 139/85  Pulse 77  Temp(Src) 99 F (37.2 C) (Temporal)  Ht 5\' 9"  (1.753 m)  Wt 165 lb (74.844 kg)  BMI 24.37 kg/m2  SpO2 99%  Physical Exam  Physical Exam  Constitutional: He is oriented to person, place, and time and well-developed, well-nourished, and in no distress. No distress.  HENT:  Head: Normocephalic and atraumatic.  Eyes: Conjunctivae are normal.  Neck: Neck supple. No thyromegaly present.  Cardiovascular: Normal rate, regular rhythm and normal heart sounds.   No murmur heard. Pulmonary/Chest: Effort normal and breath sounds normal. No respiratory distress.  Abdominal: He exhibits no distension and no mass. There is no tenderness.  Musculoskeletal: He exhibits no edema.  Neurological: He is alert and oriented to person, place, and time.  Skin: Skin is warm.  Psychiatric: Memory, affect and judgment normal.    Lab Results  Component Value Date   TSH 5.062* 04/29/2011   Lab Results  Component Value Date   WBC 9.8 05/28/2011   HGB 16.0 05/28/2011   HCT 47.2 05/28/2011   MCV 95.1 05/28/2011   PLT 244.0 05/28/2011   Lab Results  Component Value Date   CREATININE 1.00 04/29/2011   BUN 13 04/29/2011   NA 140 04/29/2011   K 5.2 04/29/2011   CL 101 04/29/2011   CO2 21 04/29/2011   Lab Results  Component Value Date   ALT 31 05/28/2011   AST 23 05/28/2011   ALKPHOS 79 05/28/2011   BILITOT 0.3 05/28/2011   Lab Results  Component Value Date   CHOL 345* 04/29/2011   Lab Results  Component Value Date   HDL 34* 04/29/2011   Lab Results  Component Value Date   LDLCALC 239* 04/29/2011   Lab Results  Component Value Date   TRIG 358* 04/29/2011   Lab Results  Component Value Date   CHOLHDL 10.1 04/29/2011     Assessment & Plan  Rectal bleeding No further episodes since his colonoscopy  Reflux Had upper endoscopy  which confirmed gastritis, patient now taking a PPI routinely and doing better symptomatically  Urinary hesitancy Patient is reporting trouble with initiating his urination at times. Also hematuria noted twice now and urine culture was negative. Will refer to urology for further evaluation  Cancer New lesion on left thigh, referred for further evaluation and excision  Colon polyps Colonoscopy revealed numerous none cancerous.   Hyperlipidemia Very hi, encouraged to avoid trans fats, minimize simple carbs and  saturated fats. Start MegaRed. Is tolerating Lovastatin. Will repeat lipid panel in 2 months.  Anxiety and depression Patient would like to consider a new counselor he is given paper work to consider [employer]. He agrees to contact them. Tolerating Wellbutrin, Fluoxetine, and Lithium no change in therapy today  Insomnia Temazepam refill given today

## 2011-05-28 NOTE — Assessment & Plan Note (Signed)
Patient is reporting trouble with initiating his urination at times. Also hematuria noted twice now and urine culture was negative. Will refer to urology for further evaluation

## 2011-05-28 NOTE — Assessment & Plan Note (Signed)
Very hi, encouraged to avoid trans fats, minimize simple carbs and saturated fats. Start MegaRed. Is tolerating Lovastatin. Will repeat lipid panel in 2 months.

## 2011-05-29 ENCOUNTER — Encounter: Payer: Self-pay | Admitting: Internal Medicine

## 2011-06-10 ENCOUNTER — Other Ambulatory Visit: Payer: Self-pay | Admitting: Dermatology

## 2011-06-27 ENCOUNTER — Encounter: Payer: Self-pay | Admitting: Internal Medicine

## 2011-07-01 ENCOUNTER — Ambulatory Visit: Payer: BC Managed Care – PPO | Admitting: Internal Medicine

## 2011-07-11 ENCOUNTER — Other Ambulatory Visit: Payer: Self-pay | Admitting: Dermatology

## 2011-07-29 ENCOUNTER — Ambulatory Visit (INDEPENDENT_AMBULATORY_CARE_PROVIDER_SITE_OTHER): Payer: BC Managed Care – PPO | Admitting: Family Medicine

## 2011-07-29 ENCOUNTER — Encounter: Payer: Self-pay | Admitting: Family Medicine

## 2011-07-29 VITALS — BP 132/78 | HR 77 | Temp 99.2°F | Ht 69.0 in | Wt 164.8 lb

## 2011-07-29 DIAGNOSIS — F329 Major depressive disorder, single episode, unspecified: Secondary | ICD-10-CM

## 2011-07-29 DIAGNOSIS — K635 Polyp of colon: Secondary | ICD-10-CM

## 2011-07-29 DIAGNOSIS — C801 Malignant (primary) neoplasm, unspecified: Secondary | ICD-10-CM

## 2011-07-29 DIAGNOSIS — F341 Dysthymic disorder: Secondary | ICD-10-CM

## 2011-07-29 DIAGNOSIS — E785 Hyperlipidemia, unspecified: Secondary | ICD-10-CM

## 2011-07-29 DIAGNOSIS — R319 Hematuria, unspecified: Secondary | ICD-10-CM

## 2011-07-29 DIAGNOSIS — R5383 Other fatigue: Secondary | ICD-10-CM

## 2011-07-29 DIAGNOSIS — D126 Benign neoplasm of colon, unspecified: Secondary | ICD-10-CM

## 2011-07-29 DIAGNOSIS — E079 Disorder of thyroid, unspecified: Secondary | ICD-10-CM

## 2011-07-29 DIAGNOSIS — D72829 Elevated white blood cell count, unspecified: Secondary | ICD-10-CM

## 2011-07-29 LAB — CBC
HCT: 44.1 % (ref 39.0–52.0)
Hemoglobin: 15.3 g/dL (ref 13.0–17.0)
MCHC: 34.7 g/dL (ref 30.0–36.0)

## 2011-07-29 LAB — RENAL FUNCTION PANEL
BUN: 16 mg/dL (ref 6–23)
Chloride: 100 mEq/L (ref 96–112)
Creat: 1.02 mg/dL (ref 0.50–1.35)
Glucose, Bld: 83 mg/dL (ref 70–99)
Phosphorus: 3.6 mg/dL (ref 2.3–4.6)

## 2011-07-29 LAB — LIPID PANEL
LDL Cholesterol: 119 mg/dL — ABNORMAL HIGH (ref 0–99)
Total CHOL/HDL Ratio: 6.7 Ratio

## 2011-07-29 LAB — TSH: TSH: 2.343 u[IU]/mL (ref 0.350–4.500)

## 2011-07-29 NOTE — Assessment & Plan Note (Signed)
Seen by urology and concerning pathology found here

## 2011-07-29 NOTE — Assessment & Plan Note (Signed)
Still struggling. No Suicidal ideation but unable to tolerate even mildly stressful situations outside his home. He is on mulitple meds and unable to keep his stress under control. Could not tolerate a stressful work situation at this time. He is in the process with setting up with a counseling situation if he is not better after that may need to consider further medication changes

## 2011-07-29 NOTE — Assessment & Plan Note (Signed)
Tolerating Lovastatin but did have some recent muscle cramps in his right hand and left arm. Encouraged to hydrate better, continue fish oil and add CoQ10 if no improvement and cramps recur may need to change statins

## 2011-07-29 NOTE — Assessment & Plan Note (Signed)
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

## 2011-07-29 NOTE — Assessment & Plan Note (Signed)
Has had upper and lower endoscopy performed which showed some gastritis and 5 polyps. He will need a repeat colonoscopy in 3 years or sooner if symptoms recur. Has not had any further rectal bleeding since right after his procedure. Encouraged better hydration and good fiber intake

## 2011-07-29 NOTE — Patient Instructions (Signed)
Squamous Cell Carcinoma   Squamous cell carcinoma is the second most common form of skin cancer. It begins in the squamous cells in the outer layer of the skin (epidermis).   CAUSES   Ultraviolet light exposure is the most common cause of squamous cell carcinoma. This may come from sunlight or tanning beds. Squamous cell carcinoma is most common in sun-exposed areas like the face, neck, arms, and hands. However, squamous cell carcinoma can occur anywhere on the body, including the lips, inside the mouth, the legs, sites of long-term (chronic) scarring, and the anus.   Other causes of squamous cell carcinoma can include:  Exposure to arsenic.   Exposure to radiation.   Exposure to toxic tars and oils.  RISK FACTORS Factors that increase your risk for squamous cell carcinoma include:  Having fair skin.   Being middle-aged or elderly.   Heavy sun exposure, especially during childhood.   Repeated sunburns.   Use of tanning beds.   A weakened immune system. This includes patients who have received a transplant and patients with human immunodeficiency virus (HIV) or acquired immunodeficency syndrome (AIDS).   Human papillomavirus infection.   Conditions that cause chronic scarring. This can include burn scars, chronic ulcers, heat (thermal) injuries, and radiation.   Exposure to psoralen plus ultraviolet A light therapy.   Exposure to chemical carcinogens, such as tar, soot, and arsenic.   Chronic, inflammatory conditions such as lupus, lichen planus, or lichen sclerosus.   Chronic infections, such as infections of the bone (osteomyelitis).   Smoking.  SYMPTOMS   Squamous cell carcinoma often starts as small, skin-colored (pink or brown) sandpaper-like growths. These growths are called solar keratoses or actinic keratoses. These growths are often more easily felt than seen.   DIAGNOSIS   Your caregiver may be able to tell what is wrong by doing a physical exam. Often, a tissue  sample is also taken. The tissue sample is examined under a microscope.   TREATMENT   The treatment for squamous cell carcinoma depends on the size and location of the tumors, as well as your overall health. Possible treatments include:    Mohs surgery. This is a procedure done by a skin doctor (dermatologist or Mohs surgeon) in his or her office. The cancerous cells are removed layer by layer.   Laser surgery to remove the tumor.   Freezing the tumor with liquid nitrogen (cryosurgery).   Radiation. This may be used for tumors on the face.   Electrodesiccation and curettage. This involves alternately scraping and burning the tumor, using an electric current to control bleeding.  If treated soon enough, squamous cell carcinoma rarely spreads to other areas of the body (metastasizes). If left untreated, however, squamous cell carcinoma will destroy the nearby tissues. This can result in the loss of a nose or ear. PREVENTION  Avoid the sun between 10:00 am and 4:00 pm when it is the strongest.   Use a sunscreen or sunblock with sun protection factor 30 or greater.   Apply sunscreen at least 30 minutes before exposure to the sun.   Reapply sunscreen every 2 to 4 hours while you are outside, after swimming, and after excessive sweating.   Always wear protective hats, clothing, and sunglasses with ultraviolet protection.   Avoid tanning beds.  HOME CARE INSTRUCTIONS    Avoid unprotected sun exposure.   Do not smoke.   Follow your caregiver's instructions for self-exams. Look for new growths or changes in your skin.   Keep   all follow-up appointments as directed by your caregiver.  SEEK MEDICAL CARE IF:    You notice any new growths or changes in your skin.   You have had a squamous cell carcinoma tumor removed and you notice a new growth in the same location.  Document Released: 09/15/2002 Document Revised: 02/28/2011 Document Reviewed: 12/03/2010 [employer] Patient Information  2012 [employer] 

## 2011-07-29 NOTE — Progress Notes (Signed)
Patient ID: Aaron Dyer, male   DOB: [birth year], 46 y.o.   MRN: [id number] Aaron Dyer [id number] October 21, 1965 07/29/2011      Progress Note-Follow Up  Subjective  Chief Complaint  Chief Complaint  Patient presents with  . Follow-up    2 month    HPI  Patient is a 48 rolled Caucasian male who is in today for followup accompanied by his wife. The patient's still struggling with depression and anxiety. He continues his current meds and he denies suicidal ideation but is still struggling with anhedonia and difficulty concentrating. [facility] but was not a good fit is trying a new psychiatrist at this time. Continues to struggle also a [facility] cancer [facility] one recently removed from his left leg and is going back to see dermatology tomorrow for removal of some other lesions on his left chest wall and his right leg. He continues to struggle with reflux although it is improved. He has been holding his aspirin as instructed. He has had no more rectal bleeding since right after his colonoscopy in February. He did remove 5 polyps. His plan is to repeat his colonoscopies in 3 years time. No chest pain, palpitations, shortness of breath or recent illness since last seen  Past Medical History  Diagnosis Date  . Chicken pox as a child  . Mumps as a child  . Anxiety and depression   . Hyperlipidemia   . Cancer     face, arms, legs, and ear, SCC  . Reflux 04/29/2011  . Rectal bleeding 04/29/2011  . Urinary hesitancy 04/30/2011  . Hematuria 04/30/2011  . IBS (irritable bowel syndrome)   . Depression   . GERD (gastroesophageal reflux disease)   . Colon polyps 05/28/2011  . Insomnia 05/28/2011    Past Surgical History  Procedure Date  . Remove squamous cell     b/l legs  . Mohs surgery 2010    right ear, twice    Family History  Problem Relation Age of Onset  . COPD Mother   . Emphysema Mother   . Stroke Mother     X 2  . Heart disease Mother     open heart  surgery  . Hyperlipidemia Mother   . Hypertension Mother   . Diabetes Mother     type 2  . Cancer Father     melanoma  . Stroke Father     26  . Hypertension Father   . Alcohol abuse Father   . Diabetes Maternal Grandmother     type 2  . Heart disease Maternal Grandmother     CHF  . Heart attack Maternal Grandfather   . Heart disease Paternal Grandmother   . Heart disease Paternal Grandfather     History   Social History  . Marital Status: Married    Spouse Name: N/A    Number of Children: N/A  . Years of Education: N/A   Occupational History  . unemployed    Social History Main Topics  . Smoking status: Never Smoker   . Smokeless tobacco: Never Used  . Alcohol Use: Yes     occasionally  . Drug Use: No  . Sexually Active: Yes   Other Topics Concern  . Not on file   Social History Narrative  . No narrative on file    Current Outpatient Prescriptions on File Prior to Visit  Medication Sig Dispense Refill  . buPROPion (WELLBUTRIN XL) 150 MG 24 hr tablet Take  1 tablet (150 mg total) by mouth daily.  30 tablet  3  . fish oil-omega-3 fatty acids 1000 MG capsule Take 1 g by mouth daily.      Marland Kitchen FLUoxetine (PROZAC) 20 MG capsule Take 1 capsule (20 mg total) by mouth 3 (three) times daily.  90 capsule  5  . lithium 300 MG tablet       . lovastatin (MEVACOR) 10 MG tablet Take 1 tablet (10 mg total) by mouth at bedtime.  30 tablet  3  . omeprazole (PRILOSEC) 40 MG capsule Take 40 mg by mouth daily.      . Probiotic Product (ALIGN) 4 MG CAPS Take 1 capsule by mouth daily.  30 capsule  0  . ranitidine (ZANTAC) 300 MG tablet Take 1 tablet (300 mg total) by mouth at bedtime.  30 tablet  5  . temazepam (RESTORIL) 15 MG capsule Take 2 capsules (30 mg total) by mouth at bedtime as needed.  30 capsule  3    No Known Allergies  Review of Systems  Review of Systems  Constitutional: Positive for malaise/fatigue. Negative for fever.  HENT: Negative for congestion.   Eyes:  Negative for discharge.  Respiratory: Negative for shortness of breath.   Cardiovascular: Negative for chest pain, palpitations and leg swelling.  Gastrointestinal: Positive for heartburn. Negative for nausea, abdominal pain, diarrhea, constipation, blood in stool and melena.  Genitourinary: Negative for dysuria.  Musculoskeletal: Negative for falls.  Skin: Positive for rash.       New lesions right leg, anterior chest wall  Neurological: Negative for loss of consciousness and headaches.  Endo/Heme/Allergies: Negative for polydipsia.  Psychiatric/Behavioral: Positive for depression. Negative for suicidal ideas. The patient is nervous/anxious and has insomnia.     Objective  BP 132/78  Pulse 77  Temp(Src) 99.2 F (37.3 C) (Temporal)  Ht 5\' 9"  (1.753 m)  Wt 164 lb 12.8 oz (74.753 kg)  BMI 24.34 kg/m2  SpO2 97%  Physical Exam  Physical Exam  Constitutional: He is oriented to person, place, and time and well-developed, well-nourished, and in no distress. No distress.  HENT:  Head: Normocephalic and atraumatic.  Eyes: Conjunctivae are normal.  Neck: Neck supple. No thyromegaly present.  Cardiovascular: Normal rate, regular rhythm and normal heart sounds.   No murmur heard. Pulmonary/Chest: Effort normal and breath sounds normal. No respiratory distress.  Abdominal: He exhibits no distension and no mass. There is no tenderness.  Musculoskeletal: He exhibits no edema.  Neurological: He is alert and oriented to person, place, and time.  Skin: Skin is warm. Rash noted.       Circular slightly raised, brown lesion 1 on right leg just above lateral malleolus and 1 over left anterior chest wall  Psychiatric: Memory, affect and judgment normal.    Lab Results  Component Value Date   TSH 5.062* 04/29/2011   Lab Results  Component Value Date   WBC 9.8 05/28/2011   HGB 16.0 05/28/2011   HCT 47.2 05/28/2011   MCV 95.1 05/28/2011   PLT 244.0 05/28/2011   Lab Results  Component Value Date    CREATININE 1.00 04/29/2011   BUN 13 04/29/2011   NA 140 04/29/2011   K 5.2 04/29/2011   CL 101 04/29/2011   CO2 21 04/29/2011   Lab Results  Component Value Date   ALT 31 05/28/2011   AST 23 05/28/2011   ALKPHOS 79 05/28/2011   BILITOT 0.3 05/28/2011   Lab Results  Component Value Date  CHOL 345* 04/29/2011   Lab Results  Component Value Date   HDL 34* 04/29/2011   Lab Results  Component Value Date   LDLCALC 239* 04/29/2011   Lab Results  Component Value Date   TRIG 358* 04/29/2011   Lab Results  Component Value Date   CHOLHDL 10.1 04/29/2011     Assessment & Plan  Cancer 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 He will need a repeat colonoscopy in 3 years or sooner if symptoms recur. Has not had any further rectal bleeding since right after his procedure. Encouraged better hydration and good fiber intake  Hyperlipidemia Tolerating Lovastatin but did have some recent muscle cramps in his right hand and left arm. Encouraged to hydrate better, continue fish oil and add CoQ10 if no improvement and cramps recur may need to change statins  Anxiety and depression Still struggling. No Suicidal ideation but unable to tolerate even mildly stressful situations outside his home. He is on mulitple meds and unable to keep his stress under control. Could not tolerate a stressful work situation at this time. He is in the process with setting up with a counseling situation if he is not better after that may need to consider further medication changes

## 2011-07-30 MED ORDER — LOVASTATIN 20 MG PO TABS: 20.0000 mg | ORAL_TABLET | Freq: Every day | ORAL | Status: DC

## 2011-07-30 NOTE — Progress Notes (Signed)
Addended by: Court Joy on: 07/30/2011 12:01 PM   Modules accepted: Orders

## 2011-08-27 ENCOUNTER — Ambulatory Visit (INDEPENDENT_AMBULATORY_CARE_PROVIDER_SITE_OTHER): Payer: BC Managed Care – PPO | Admitting: Psychiatry

## 2011-08-27 DIAGNOSIS — F0630 Mood disorder due to known physiological condition, unspecified: Secondary | ICD-10-CM

## 2011-08-27 DIAGNOSIS — F331 Major depressive disorder, recurrent, moderate: Secondary | ICD-10-CM

## 2011-09-05 ENCOUNTER — Ambulatory Visit: Payer: BC Managed Care – PPO | Admitting: Psychiatry

## 2011-09-06 ENCOUNTER — Encounter: Payer: Self-pay | Admitting: Family Medicine

## 2011-09-06 ENCOUNTER — Telehealth: Payer: Self-pay

## 2011-09-06 NOTE — Telephone Encounter (Signed)
Kristi informed patients wife that paperwork is ready to be picked up

## 2011-09-06 NOTE — Telephone Encounter (Signed)
I left a message for patient to return my call.  Per MD patient can pick up paperwork for attorney but can not be mailed.

## 2011-09-12 ENCOUNTER — Other Ambulatory Visit: Payer: Self-pay

## 2011-09-12 ENCOUNTER — Other Ambulatory Visit: Payer: Self-pay | Admitting: Family Medicine

## 2011-09-12 DIAGNOSIS — G4700 Insomnia, unspecified: Secondary | ICD-10-CM

## 2011-09-12 MED ORDER — TEMAZEPAM 15 MG PO CAPS: 30.0000 mg | ORAL_CAPSULE | Freq: Every evening | ORAL | Status: DC | PRN

## 2011-09-18 ENCOUNTER — Ambulatory Visit (INDEPENDENT_AMBULATORY_CARE_PROVIDER_SITE_OTHER): Payer: BC Managed Care – PPO | Admitting: Family Medicine

## 2011-09-18 ENCOUNTER — Encounter: Payer: Self-pay | Admitting: Family Medicine

## 2011-09-18 VITALS — BP 128/80 | HR 73 | Temp 96.8°F | Ht 69.0 in | Wt 162.8 lb

## 2011-09-18 DIAGNOSIS — K625 Hemorrhage of anus and rectum: Secondary | ICD-10-CM

## 2011-09-18 DIAGNOSIS — F419 Anxiety disorder, unspecified: Secondary | ICD-10-CM

## 2011-09-18 DIAGNOSIS — F341 Dysthymic disorder: Secondary | ICD-10-CM

## 2011-09-18 DIAGNOSIS — E785 Hyperlipidemia, unspecified: Secondary | ICD-10-CM

## 2011-09-18 DIAGNOSIS — C801 Malignant (primary) neoplasm, unspecified: Secondary | ICD-10-CM

## 2011-09-18 MED ORDER — OMEPRAZOLE 40 MG PO CPDR: 40.0000 mg | DELAYED_RELEASE_CAPSULE | Freq: Every day | ORAL | Status: DC

## 2011-09-18 NOTE — Progress Notes (Signed)
Patient ID: Aaron Dyer, male   DOB: 1965/12/13, 46 y.o.   MRN: [id number] JAILEN COWARD [id number] [birth year] 09/18/2011      Progress Note-Follow Up  Subjective  Chief Complaint  Chief Complaint  Patient presents with  . Follow-up    1 month    HPI  Patient is a 46 yo Caucasian male in today for follow up. She has been seen by dermatology since he was last seen and the lesion on his right leg was in fact squamous cell carcinoma and has been excised. Lesion on his chest wall was not. He continues to struggle with anxiety, depression and insomnia. Feels he is handling it relatively well but has not had good experience with the counseling groups he is seen thus far. He denies any suicidal ideation. He continues to struggle with being unable to work due to his anxiety but also historically he has worked his skin cancers they're telling him he should not do that. Otherwise she denies any other acute complaints. Continues to have cramping and pain in his hands but that is ongoing. Did not worsen when we increased his lovastatin. He's not had any chest pain, palpitations, shortness of breath, GI or GU complaints since he was last seen.  Past Medical History  Diagnosis Date  . Chicken pox as a child  . Mumps as a child  . Anxiety and depression   . Hyperlipidemia   . Cancer     face, arms, legs, and ear, SCC  . Reflux 04/29/2011  . Rectal bleeding 04/29/2011  . Urinary hesitancy 04/30/2011  . Hematuria 04/30/2011  . IBS (irritable bowel syndrome)   . Depression   . GERD (gastroesophageal reflux disease)   . Colon polyps 05/28/2011  . Insomnia 05/28/2011    Past Surgical History  Procedure Date  . Remove squamous cell     b/l legs  . Mohs surgery 2010    right ear, twice    Family History  Problem Relation Age of Onset  . COPD Mother   . Emphysema Mother   . Stroke Mother     X 2  . Heart disease Mother     open heart surgery  . Hyperlipidemia Mother   . Hypertension Mother     . Diabetes Mother     type 2  . Cancer Father     melanoma  . Stroke Father     67  . Hypertension Father   . Alcohol abuse Father   . Diabetes Maternal Grandmother     type 2  . Heart disease Maternal Grandmother     CHF  . Heart attack Maternal Grandfather   . Heart disease Paternal Grandmother   . Heart disease Paternal Grandfather     History   Social History  . Marital Status: Married    Spouse Name: N/A    Number of Children: N/A  . Years of Education: N/A   Occupational History  . unemployed    Social History Main Topics  . Smoking status: Never Smoker   . Smokeless tobacco: Never Used  . Alcohol Use: Yes     occasionally  . Drug Use: No  . Sexually Active: Yes   Other Topics Concern  . Not on file   Social History Narrative  . No narrative on file    Current Outpatient Prescriptions on File Prior to Visit  Medication Sig Dispense Refill  . buPROPion (WELLBUTRIN XL) 150 MG 24 hr tablet TAKE ONE TABLET  BY MOUTH DAILY  30 tablet  2  . fish oil-omega-3 fatty acids 1000 MG capsule Take 1 g by mouth daily.      Marland Kitchen FLUoxetine (PROZAC) 20 MG capsule Take 1 capsule (20 mg total) by mouth 3 (three) times daily.  90 capsule  5  . lithium 300 MG tablet       . lovastatin (MEVACOR) 20 MG tablet Take 1 tablet (20 mg total) by mouth at bedtime.  30 tablet  3  . Probiotic Product (ALIGN) 4 MG CAPS Take 1 capsule by mouth daily.  30 capsule  0  . ranitidine (ZANTAC) 300 MG tablet Take 1 tablet (300 mg total) by mouth at bedtime.  30 tablet  5  . temazepam (RESTORIL) 15 MG capsule Take 2 capsules (30 mg total) by mouth at bedtime as needed.  30 capsule  3  . DISCONTD: omeprazole (PRILOSEC) 40 MG capsule Take 40 mg by mouth daily.      Marland Kitchen DISCONTD: lovastatin (MEVACOR) 10 MG tablet Take 1 tablet (10 mg total) by mouth at bedtime.  30 tablet  3    No Known Allergies  Review of Systems  Review of Systems  Constitutional: Negative for fever and malaise/fatigue.   HENT: Negative for congestion.   Eyes: Negative for discharge.  Respiratory: Negative for shortness of breath.   Cardiovascular: Negative for chest pain, palpitations and leg swelling.  Gastrointestinal: Negative for nausea, abdominal pain and diarrhea.  Genitourinary: Negative for dysuria.  Musculoskeletal: Negative for falls.  Skin: Negative for rash.  Neurological: Negative for loss of consciousness and headaches.  Endo/Heme/Allergies: Negative for polydipsia.  Psychiatric/Behavioral: Positive for depression. Negative for suicidal ideas. The patient is nervous/anxious and has insomnia.     Objective  BP 128/80  Pulse 73  Temp 96.8 F (36 C) (Temporal)  Ht 5\' 9"  (1.753 m)  Wt 162 lb 12.8 oz (73.846 kg)  BMI 24.04 kg/m2  SpO2 98%  Physical Exam  Physical Exam  Constitutional: He is oriented to person, place, and time and well-developed, well-nourished, and in no distress. No distress.  HENT:  Head: Normocephalic and atraumatic.  Eyes: Conjunctivae are normal.  Neck: Neck supple. No thyromegaly present.  Cardiovascular: Normal rate, regular rhythm and normal heart sounds.   No murmur heard. Pulmonary/Chest: Effort normal and breath sounds normal. No respiratory distress.  Abdominal: He exhibits no distension and no mass. There is no tenderness.  Musculoskeletal: He exhibits no edema.  Neurological: He is alert and oriented to person, place, and time.  Skin: Skin is warm.       Linear scar on right lateral lower leg and left lateral thigh, healing well  Psychiatric: Memory, affect and judgment normal.    Lab Results  Component Value Date   TSH 2.343 07/29/2011   Lab Results  Component Value Date   WBC 10.4 07/29/2011   HGB 15.3 07/29/2011   HCT 44.1 07/29/2011   MCV 93.2 07/29/2011   PLT 272 07/29/2011   Lab Results  Component Value Date   CREATININE 1.02 07/29/2011   BUN 16 07/29/2011   NA 137 07/29/2011   K 4.4 07/29/2011   CL 100 07/29/2011   CO2 31 07/29/2011   Lab  Results  Component Value Date   ALT 31 05/28/2011   AST 23 05/28/2011   ALKPHOS 79 05/28/2011   BILITOT 0.3 05/28/2011   Lab Results  Component Value Date   CHOL 222* 07/29/2011   Lab Results  Component Value Date  HDL 33* 07/29/2011   Lab Results  Component Value Date   LDLCALC 119* 07/29/2011   Lab Results  Component Value Date   TRIG 350* 07/29/2011   Lab Results  Component Value Date   CHOLHDL 6.7 07/29/2011     Assessment & Plan  Rectal bleeding Had one episode after his colonoscopy but none since then. He is encouraged to add Metamucil and a probiotic and to report if he has further episodes  Hyperlipidemia Lovastatin was increased after his last cholesterol panel and he did not have any worsening of his muscle or hand cramps. He is encouraged to increase his fish oil to 2 caps daily and he should add CoQ10 if no resolution of pain can hold the Lovastatin for a month to see if his symptoms improve. Will recheck cholesterol in 2-3 months time  Anxiety and depression Doing somewhat better on his meds at present. Has had a hard time finding a counselor that is helpful to him. He is given a handout about [facility] and asked to call them if he decides counseling will help him  Cancer Recent new lesion excised from RLE was SCC, lesion on left anterior CW was not

## 2011-09-18 NOTE — Assessment & Plan Note (Signed)
Doing somewhat better on his meds at present. Has had a hard time finding a counselor that is helpful to him. He is given a handout about [employer] [facility] and asked to call them if he decides counseling will help him

## 2011-09-18 NOTE — Assessment & Plan Note (Signed)
Had one episode after his colonoscopy but none since then. He is encouraged to add Metamucil and a probiotic and to report if he has further episodes

## 2011-09-18 NOTE — Assessment & Plan Note (Signed)
Lovastatin was increased after his last cholesterol panel and he did not have any worsening of his muscle or hand cramps. He is encouraged to increase his fish oil to 2 caps daily and he should add CoQ10 if no resolution of pain can hold the Lovastatin for a month to see if his symptoms improve. Will recheck cholesterol in 2-3 months time

## 2011-09-18 NOTE — Patient Instructions (Addendum)
Cholesterol Cholesterol is a white, waxy, fat-like protein needed by your body in small amounts. The liver makes all the cholesterol you need. It is carried from the liver by the blood through the blood vessels. Deposits (plaque) may build up on blood vessel walls. This makes the arteries narrower and stiffer. Plaque increases the risk for heart attack and stroke. You cannot feel your cholesterol level even if it is very high. The only way to know is by a blood test to check your lipid (fats) levels. Once you know your cholesterol levels, you should keep a record of the test results. Work with your caregiver to to keep your levels in the desired range. WHAT THE RESULTS MEAN:  Total cholesterol is a rough measure of all the cholesterol in your blood.   LDL is the so-called bad cholesterol. This is the type that deposits cholesterol in the walls of the arteries. You want this level to be low.   HDL is the good cholesterol because it cleans the arteries and carries the LDL away. You want this level to be high.   Triglycerides are fat that the body can either burn for energy or store. High levels are closely linked to heart disease.  DESIRED LEVELS:  Total cholesterol below 200.   LDL below 100 for people at risk, below 70 for very high risk.   HDL above 50 is good, above 60 is best.   Triglycerides below 150.  HOW TO LOWER YOUR CHOLESTEROL:  Diet.   Choose fish or white meat chicken and [place], roasted or baked. Limit fatty cuts of red meat, fried foods, and processed meats, such as sausage and lunch meat.   Eat lots of fresh fruits and vegetables. Choose whole grains, beans, pasta, potatoes and cereals.   Use only small amounts of olive, corn or canola oils. Avoid butter, mayonnaise, shortening or palm kernel oils. Avoid foods with trans-fats.   Use skim/nonfat milk and low-fat/nonfat yogurt and cheeses. Avoid whole milk, cream, ice cream, egg yolks and cheeses. Healthy desserts include  angel food cake, ginger snaps, animal crackers, hard candy, popsicles, and low-fat/nonfat frozen yogurt. Avoid pastries, cakes, pies and cookies.   Exercise.   A regular program helps decrease LDL and raises HDL.   Helps with weight control.   Do things that increase your activity level like gardening, walking, or taking the stairs.   Medication.   May be prescribed by your caregiver to help lowering cholesterol and the risk for heart disease.   You may need medicine even if your levels are normal if you have several risk factors.  HOME CARE INSTRUCTIONS   Follow your diet and exercise programs as suggested by your caregiver.   Take medications as directed.   Have blood work done when your caregiver feels it is necessary.  MAKE SURE YOU:   Understand these instructions.   Will watch your condition.   Will get help right away if you are not doing well or get worse.  Document Released: 12/04/2000 Document Revised: 02/28/2011 Document Reviewed: 05/27/2007 [facility] Patient Information 2012 [place], [place].   If muscle pains/hand cramps get worse either hold Lovastatin for a month and see if it gets better. If it does then restart and see if it gets worse again. If so then stop for good and let me know.  Or you can try adding the CoQ10 to help and increase to 2 fish oils a day  Add MetaMucil daily and a yogurt daily or add  a probiotic cap daily

## 2011-09-18 NOTE — Assessment & Plan Note (Signed)
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

## 2011-11-18 ENCOUNTER — Other Ambulatory Visit (INDEPENDENT_AMBULATORY_CARE_PROVIDER_SITE_OTHER): Payer: BC Managed Care – PPO

## 2011-11-18 DIAGNOSIS — E785 Hyperlipidemia, unspecified: Secondary | ICD-10-CM

## 2011-11-18 LAB — RENAL FUNCTION PANEL
Albumin: 4.3 g/dL (ref 3.5–5.2)
BUN: 15 mg/dL (ref 6–23)
CO2: 31 mEq/L (ref 19–32)
Calcium: 9.6 mg/dL (ref 8.4–10.5)
Creatinine, Ser: 1 mg/dL (ref 0.4–1.5)
Sodium: 138 mEq/L (ref 135–145)

## 2011-11-18 LAB — HEPATIC FUNCTION PANEL
ALT: 24 U/L (ref 0–53)
AST: 17 U/L (ref 0–37)
Albumin: 4.3 g/dL (ref 3.5–5.2)
Alkaline Phosphatase: 72 U/L (ref 39–117)
Total Protein: 7.4 g/dL (ref 6.0–8.3)

## 2011-11-18 LAB — LIPID PANEL
Cholesterol: 294 mg/dL — ABNORMAL HIGH (ref 0–200)
Total CHOL/HDL Ratio: 8
Triglycerides: 412 mg/dL — ABNORMAL HIGH (ref 0.0–149.0)

## 2011-11-18 LAB — CBC
HCT: 45 % (ref 39.0–52.0)
Hemoglobin: 14.8 g/dL (ref 13.0–17.0)
MCV: 96.4 fl (ref 78.0–100.0)
RDW: 14.4 % (ref 11.5–14.6)
WBC: 10.2 10*3/uL (ref 4.5–10.5)

## 2011-11-19 ENCOUNTER — Ambulatory Visit (INDEPENDENT_AMBULATORY_CARE_PROVIDER_SITE_OTHER)
Admission: RE | Admit: 2011-11-19 | Discharge: 2011-11-19 | Disposition: A | Discharge status: Home / Self Care | Payer: BC Managed Care – PPO | Source: Ambulatory Visit | Attending: Family Medicine | Admitting: Family Medicine

## 2011-11-19 ENCOUNTER — Ambulatory Visit (INDEPENDENT_AMBULATORY_CARE_PROVIDER_SITE_OTHER): Payer: BC Managed Care – PPO | Admitting: Family Medicine

## 2011-11-19 ENCOUNTER — Encounter: Payer: Self-pay | Admitting: Family Medicine

## 2011-11-19 VITALS — BP 135/83 | HR 71 | Temp 97.2°F | Ht 69.0 in | Wt 167.8 lb

## 2011-11-19 DIAGNOSIS — E785 Hyperlipidemia, unspecified: Secondary | ICD-10-CM

## 2011-11-19 DIAGNOSIS — R079 Chest pain, unspecified: Secondary | ICD-10-CM

## 2011-11-19 DIAGNOSIS — K649 Unspecified hemorrhoids: Secondary | ICD-10-CM

## 2011-11-19 DIAGNOSIS — F419 Anxiety disorder, unspecified: Secondary | ICD-10-CM

## 2011-11-19 DIAGNOSIS — R5383 Other fatigue: Secondary | ICD-10-CM

## 2011-11-19 DIAGNOSIS — R5381 Other malaise: Secondary | ICD-10-CM

## 2011-11-19 DIAGNOSIS — F341 Dysthymic disorder: Secondary | ICD-10-CM

## 2011-11-19 MED ORDER — ROSUVASTATIN CALCIUM 10 MG PO TABS: 10.0000 mg | ORAL_TABLET | Freq: Every day | ORAL | Status: DC

## 2011-11-19 MED ORDER — NITROGLYCERIN 0.4 MG SL SUBL: 0.4000 mg | SUBLINGUAL_TABLET | SUBLINGUAL | Status: DC | PRN

## 2011-11-19 NOTE — Assessment & Plan Note (Signed)
Doing somewhat better on combination of Fluoxetine and Wellbutrin, still needs a visit with psychiatry is given the name of Dr Nolen Mu to try

## 2011-11-19 NOTE — Progress Notes (Signed)
Patient ID: Aaron Dyer, male   DOB: [birth year], 46 y.o.   MRN: [id number] ELAD MACPHAIL [id number] [birth year] 11/19/2011      Progress Note-Follow Up  Subjective  Chief Complaint  Chief Complaint  Patient presents with  . Follow-up    HPI  Patient is a 13 rolled Caucasian male who is in today for followup. He is noting some squeezing, aching chest pain on the left-hand side for about a week now. X-rays somewhat better today. Occasionally the pain is associated with shortness of breath but not always. There is no diaphoresis, nausea, palpitations associated. He denies any recent trauma or injury. He has been feeling more fatigued and achy compared to usual. Denies any congestion, headache, urinary complaints. He has had 2 episodes of rectal bleeding this last month but it is resolved with suppositories and is always after hard stool.  Past Medical History  Diagnosis Date  . Chicken pox as a child  . Mumps as a child  . Anxiety and depression   . Hyperlipidemia   . Reflux 04/29/2011  . Rectal bleeding 04/29/2011  . Urinary hesitancy 04/30/2011  . Hematuria 04/30/2011  . IBS (irritable bowel syndrome)   . Depression   . GERD (gastroesophageal reflux disease)   . Colon polyps 05/28/2011  . Insomnia 05/28/2011  . Cancer     face, arms, legs, and ear, SCC  . Hemorrhoids 04/29/2011  . Chest pain 11/19/2011    Past Surgical History  Procedure Date  . Remove squamous cell     b/l legs  . Mohs surgery 2010    right ear, twice    Family History  Problem Relation Age of Onset  . COPD Mother   . Emphysema Mother   . Stroke Mother     X 2  . Heart disease Mother     open heart surgery  . Hyperlipidemia Mother   . Hypertension Mother   . Diabetes Mother     type 2  . Cancer Father     melanoma  . Stroke Father     26  . Hypertension Father   . Alcohol abuse Father   . Diabetes Maternal Grandmother     type 2  . Heart disease Maternal Grandmother     CHF  . Heart attack  Maternal Grandfather   . Heart disease Paternal Grandmother   . Heart disease Paternal Grandfather     History   Social History  . Marital Status: Married    Spouse Name: N/A    Number of Children: N/A  . Years of Education: N/A   Occupational History  . unemployed    Social History Main Topics  . Smoking status: Never Smoker   . Smokeless tobacco: Never Used  . Alcohol Use: Yes     occasionally  . Drug Use: No  . Sexually Active: Yes   Other Topics Concern  . Not on file   Social History Narrative  . No narrative on file    Current Outpatient Prescriptions on File Prior to Visit  Medication Sig Dispense Refill  . buPROPion (WELLBUTRIN XL) 150 MG 24 hr tablet TAKE ONE TABLET BY MOUTH DAILY  30 tablet  2  . fish oil-omega-3 fatty acids 1000 MG capsule Take 1 g by mouth daily.      Marland Kitchen FLUoxetine (PROZAC) 20 MG capsule Take 1 capsule (20 mg total) by mouth 3 (three) times daily.  90 capsule  5  . omeprazole (PRILOSEC) 40 MG  capsule Take 1 capsule (40 mg total) by mouth daily.  30 capsule  11  . ranitidine (ZANTAC) 300 MG tablet Take 1 tablet (300 mg total) by mouth at bedtime.  30 tablet  5  . temazepam (RESTORIL) 15 MG capsule Take 2 capsules (30 mg total) by mouth at bedtime as needed.  30 capsule  3  . nitroGLYCERIN (NITROSTAT) 0.4 MG SL tablet Place 1 tablet (0.4 mg total) under the tongue every 5 (five) minutes as needed for chest pain. After 3 doses cp still present proceed to ER  25 tablet  3  . rosuvastatin (CRESTOR) 10 MG tablet Take 1 tablet (10 mg total) by mouth daily.  42 tablet  0    No Known Allergies  Review of Systems  Review of Systems  Constitutional: Negative for fever and malaise/fatigue.  HENT: Negative for congestion.   Eyes: Negative for discharge.  Respiratory: Positive for shortness of breath.   Cardiovascular: Positive for chest pain. Negative for palpitations and leg swelling.  Gastrointestinal: Positive for constipation and blood in stool.  Negative for nausea, abdominal pain, diarrhea and melena.  Genitourinary: Negative for dysuria.  Musculoskeletal: Negative for falls.  Skin: Negative for rash.  Neurological: Negative for loss of consciousness and headaches.  Endo/Heme/Allergies: Negative for polydipsia.  Psychiatric/Behavioral: Positive for depression. Negative for suicidal ideas. The patient is nervous/anxious. The patient does not have insomnia.     Objective  BP 135/83  Pulse 71  Temp 97.2 F (36.2 C) (Temporal)  Ht 5\' 9"  (1.753 m)  Wt 167 lb 12.8 oz (76.114 kg)  BMI 24.78 kg/m2  SpO2 96%  Physical Exam  Physical Exam  Constitutional: He is oriented to person, place, and time and well-developed, well-nourished, and in no distress. No distress.  HENT:  Head: Normocephalic and atraumatic.  Eyes: Conjunctivae are normal.  Neck: Neck supple. No thyromegaly present.  Cardiovascular: Normal rate, regular rhythm and normal heart sounds.   No murmur heard. Pulmonary/Chest: Effort normal and breath sounds normal. No respiratory distress.  Abdominal: He exhibits no distension and no mass. There is no tenderness.  Musculoskeletal: He exhibits no edema.  Neurological: He is alert and oriented to person, place, and time.  Skin: Skin is warm.  Psychiatric: Memory, affect and judgment normal.    Lab Results  Component Value Date   TSH 2.343 07/29/2011   Lab Results  Component Value Date   WBC 10.2 11/18/2011   HGB 14.8 11/18/2011   HCT 45.0 11/18/2011   MCV 96.4 11/18/2011   PLT 212.0 11/18/2011   Lab Results  Component Value Date   CREATININE 1.0 11/18/2011   BUN 15 11/18/2011   NA 138 11/18/2011   K 4.3 11/18/2011   CL 100 11/18/2011   CO2 31 11/18/2011   Lab Results  Component Value Date   ALT 24 11/18/2011   AST 17 11/18/2011   ALKPHOS 72 11/18/2011   BILITOT 0.4 11/18/2011   Lab Results  Component Value Date   CHOL 294* 11/18/2011   Lab Results  Component Value Date   HDL 38.20* 11/18/2011   Lab  Results  Component Value Date   LDLCALC 119* 07/29/2011   Lab Results  Component Value Date   TRIG 412.0* 11/18/2011   Lab Results  Component Value Date   CHOLHDL 8 11/18/2011     Assessment & Plan  Hemorrhoids Continues to struggle with intermittent rectal bleeding, 2 episodes in August after passing hard stool encouraged increased fluids, add Metamucil daily  and a probiotic and may use suppositories as needed. If no improvement he should notify us  Hyperlipidemia Given samples of Crestor 10 mg po qhs to start  Anxiety and depression Doing somewhat better on combination of Fluoxetine and Wellbutrin, still needs a visit with psychiatry is given the name of Dr Nolen Mu to try  Chest pain He reports it started last week and is more pressure on the left side. The pain is better today than it has been. He's had moments where there's been some associated shortness or breath as well. His EKG and chest x-ray and normal here today and is not in any pain. We will set up cardiology. He is to continue with the 81 mg aspirin and is given some sublingual nitroglycerin to use should the pain returnand not resolve. If his chest pain is now resolved by 3 nitroglycerin he presented to the emergency room.

## 2011-11-19 NOTE — Assessment & Plan Note (Signed)
Continues to struggle with intermittent rectal bleeding, 2 episodes in August after passing hard stool encouraged increased fluids, add Metamucil daily and a probiotic and may use suppositories as needed. If no improvement he should notify us

## 2011-11-19 NOTE — Assessment & Plan Note (Signed)
He reports it started last week and is more pressure on the left side. The pain is better today than it has been. He's had moments where there's been some associated shortness or breath as well. His EKG and chest x-ray and normal here today and is not in any pain. We will set up cardiology. He is to continue with the 81 mg aspirin and is given some sublingual nitroglycerin to use should the pain returnand not resolve. If his chest pain is now resolved by 3 nitroglycerin he presented to the emergency room.

## 2011-11-19 NOTE — Patient Instructions (Addendum)
Chest Pain (Nonspecific) It is often hard to give a specific diagnosis for the cause of chest pain. There is always a chance that your pain could be related to something serious, such as a heart attack or a blood clot in the lungs. You need to follow up with your caregiver for further evaluation. CAUSES   Heartburn.   Pneumonia or bronchitis.   Anxiety or stress.   Inflammation around your heart (pericarditis) or lung (pleuritis or pleurisy).   A blood clot in the lung.   A collapsed lung (pneumothorax). It can develop suddenly on its own (spontaneous pneumothorax) or from injury (trauma) to the chest.   Shingles infection (herpes zoster virus).  The chest wall is composed of bones, muscles, and cartilage. Any of these can be the source of the pain.  The bones can be bruised by injury.   The muscles or cartilage can be strained by coughing or overwork.   The cartilage can be affected by inflammation and become sore (costochondritis).  DIAGNOSIS  Lab tests or other studies, such as X-rays, electrocardiography, stress testing, or cardiac imaging, may be needed to find the cause of your pain.  TREATMENT   Treatment depends on what may be causing your chest pain. Treatment may include:   Acid blockers for heartburn.   Anti-inflammatory medicine.   Pain medicine for inflammatory conditions.   Antibiotics if an infection is present.   You may be advised to change lifestyle habits. This includes stopping smoking and avoiding alcohol, caffeine, and chocolate.   You may be advised to keep your head raised (elevated) when sleeping. This reduces the chance of acid going backward from your stomach into your esophagus.   Most of the time, nonspecific chest pain will improve within 2 to 3 days with rest and mild pain medicine.  HOME CARE INSTRUCTIONS   If antibiotics were prescribed, take your antibiotics as directed. Finish them even if you start to feel better.   For the next few  days, avoid physical activities that bring on chest pain. Continue physical activities as directed.   Do not smoke.   Avoid drinking alcohol.   Only take over-the-counter or prescription medicine for pain, discomfort, or fever as directed by your caregiver.   Follow your caregiver's suggestions for further testing if your chest pain does not go away.   Keep any follow-up appointments you made. If you do not go to an appointment, you could develop lasting (chronic) problems with pain. If there is any problem keeping an appointment, you must call to reschedule.  SEEK MEDICAL CARE IF:   You think you are having problems from the medicine you are taking. Read your medicine instructions carefully.   Your chest pain does not go away, even after treatment.   You develop a rash with blisters on your chest.  SEEK IMMEDIATE MEDICAL CARE IF:   You have increased chest pain or pain that spreads to your arm, neck, jaw, back, or abdomen.   You develop shortness of breath, an increasing cough, or you are coughing up blood.   You have severe back or abdominal pain, feel nauseous, or vomit.   You develop severe weakness, fainting, or chills.   You have a fever.  THIS IS AN EMERGENCY. Do not wait to see if the pain will go away. Get medical help at once. Call your local emergency services (911 in [place] Do not drive yourself to the hospital. MAKE SURE YOU:   Understand these instructions.     Will watch your condition.   Will get help right away if you are not doing well or get worse.  Document Released: 12/19/2004 Document Revised: 02/28/2011 Document Reviewed: 10/15/2007 [facility] Patient Information 2012 [place], [place].  Constipation in Adults Constipation is having fewer than 2 bowel movements per week. Usually, the stools are hard. As we grow older, constipation is more common. If you try to fix constipation with laxatives, the problem may get worse. This is because laxatives taken over a  long period of time make the colon muscles weaker. A low-fiber diet, not taking in enough fluids, and taking some medicines may make these problems worse. MEDICATIONS THAT MAY CAUSE CONSTIPATION  Water pills (diuretics).   Calcium channel blockers (used to control blood pressure and for the heart).   Certain pain medicines (narcotics).   Anticholinergics.   Anti-inflammatory agents.   Antacids that contain aluminum.  DISEASES THAT CONTRIBUTE TO CONSTIPATION  Diabetes.   Parkinson's disease.   Dementia.   Stroke.   Depression.   Illnesses that cause problems with salt and water metabolism.  HOME CARE INSTRUCTIONS   Constipation is usually best cared for without medicines. Increasing dietary fiber and eating more fruits and vegetables is the best way to manage constipation.   Slowly increase fiber intake to 25 to 38 grams per day. Whole grains, fruits, vegetables, and legumes are good sources of fiber. A dietitian can further help you incorporate high-fiber foods into your diet.   Drink enough water and fluids to keep your urine clear or pale yellow.   A fiber supplement may be added to your diet if you cannot get enough fiber from foods.   Increasing your activities also helps improve regularity.   Suppositories, as suggested by your caregiver, will also help. If you are using antacids, such as aluminum or calcium containing products, it will be helpful to switch to products containing magnesium if your caregiver says it is okay.   If you have been given a liquid injection (enema) today, this is only a temporary measure. It should not be relied on for treatment of longstanding (chronic) constipation.   Stronger measures, such as magnesium sulfate, should be avoided if possible. This may cause uncontrollable diarrhea. Using magnesium sulfate may not allow you time to make it to the bathroom.  SEEK IMMEDIATE MEDICAL CARE IF:   There is bright red blood in the stool.   The  constipation stays for more than 4 days.   There is belly (abdominal) or rectal pain.   You do not seem to be getting better.   You have any questions or concerns.  MAKE SURE YOU:   Understand these instructions.   Will watch your condition.   Will get help right away if you are not doing well or get worse.  Document Released: 12/08/2003 Document Revised: 02/28/2011 Document Reviewed: 02/12/2011 [facility] Patient Information 2012 [place], [place].  Increase clear fluid intake to greater than 64 ounces daily, add Metamucil powder to 8 oz of fluids and drink daily and eat a yogurt or start a probiotic such as Align caps or a generic like it daily

## 2011-11-19 NOTE — Assessment & Plan Note (Signed)
Given samples of Crestor 10 mg po qhs to start

## 2011-11-19 NOTE — Progress Notes (Signed)
Quick Note:  Dr Abner Greenspan informed patient during office visit today ______

## 2011-11-27 ENCOUNTER — Ambulatory Visit (INDEPENDENT_AMBULATORY_CARE_PROVIDER_SITE_OTHER): Payer: BC Managed Care – PPO | Admitting: Cardiology

## 2011-11-27 ENCOUNTER — Encounter: Payer: Self-pay | Admitting: Cardiology

## 2011-11-27 VITALS — BP 129/85 | HR 73 | Ht 69.0 in | Wt 166.0 lb

## 2011-11-27 DIAGNOSIS — R079 Chest pain, unspecified: Secondary | ICD-10-CM

## 2011-11-27 DIAGNOSIS — E785 Hyperlipidemia, unspecified: Secondary | ICD-10-CM

## 2011-11-27 NOTE — Assessment & Plan Note (Signed)
Continue statin. Followed by primary care. 

## 2011-11-27 NOTE — Assessment & Plan Note (Signed)
Symptoms atypical. Electrocardiogram normal. Plan exercise treadmill. 

## 2011-11-27 NOTE — Addendum Note (Signed)
Addended by: Freddi Starr on: 11/27/2011 03:44 PM   Modules accepted: Orders

## 2011-11-27 NOTE — Patient Instructions (Addendum)
Your physician recommends that you schedule a follow-up appointment in: AS NEEDED PENDING TEST RESULTS  Your physician has requested that you have an exercise tolerance test. For further information please visit [email or web address] Please also follow instruction sheet, as given.    Exercise Stress Electrocardiography An exercise stress test is a heart test (EKG) which is done while you are moving. You will walk on a treadmill. This test will tell your doctor how your heart does when it is forced to work harder and how much activity you can safely handle. BEFORE THE TEST  Wear shorts or athletic pants.   Wear comfortable tennis shoes.   Women need to wear a bra that allows patches to be put on under it.  TEST  An EKG cable will be attached to your waist. This cable is hooked up to patches, which look like round stickers stuck to your chest.   You will be asked to walk on the treadmill.   You will walk until you are too tired or until you are told to stop.   Tell the doctor right away if you have:   Chest pain.   Leg cramps.   Shortness of breath.   Dizziness.   The test may last 30 minutes to 1 hour. The timing depends on your physical condition and the condition of your heart.  AFTER THE TEST  You will rest for about 6 minutes. During this time, your heart rhythm and blood pressure will be checked.   The testing equipment will be removed from your body and you can get dressed.   You may go home or back to your hospital room. You may keep doing all your usual activities as told by your doctor.  Finding out the results of your test Ask when your test results will be ready. Make sure you get your test results. Document Released: 08/28/2007 Document Revised: 02/28/2011 Document Reviewed: 08/28/2007 [facility] Patient Information 2012 [place], [place].

## 2011-11-27 NOTE — Progress Notes (Signed)
HPI: pleasant 46 year old male with no prior cardiac history for evaluation of chest pain. Patient has had intermittent chest pain for approximately one month. The pain is in the left breast area. It lasts seconds to 1 minute. It occurs both with exertion and at rest. The pain is not pleuritic, positional or related to food. No associated symptoms. He denies dyspnea on exertion, orthopnea, PND, pedal edema or syncope. Because of his chest pain we were asked to evaluate. Current Outpatient Prescriptions  Medication Sig Dispense Refill  . aspirin 81 MG tablet Take 81 mg by mouth daily.      Marland Kitchen buPROPion (WELLBUTRIN XL) 150 MG 24 hr tablet TAKE ONE TABLET BY MOUTH DAILY  30 tablet  2  . fish oil-omega-3 fatty acids 1000 MG capsule Take 1 g by mouth daily.      Marland Kitchen FLUoxetine (PROZAC) 20 MG capsule Take 1 capsule (20 mg total) by mouth 3 (three) times daily.  90 capsule  5  . nitroGLYCERIN (NITROSTAT) 0.4 MG SL tablet Place 1 tablet (0.4 mg total) under the tongue every 5 (five) minutes as needed for chest pain. After 3 doses cp still present proceed to ER  25 tablet  3  . omeprazole (PRILOSEC) 40 MG capsule Take 1 capsule (40 mg total) by mouth daily.  30 capsule  11  . ranitidine (ZANTAC) 300 MG tablet Take 1 tablet (300 mg total) by mouth at bedtime.  30 tablet  5  . rosuvastatin (CRESTOR) 10 MG tablet Take 1 tablet (10 mg total) by mouth daily.  42 tablet  0  . temazepam (RESTORIL) 15 MG capsule Take 2 capsules (30 mg total) by mouth at bedtime as needed.  30 capsule  3    No Known Allergies  Past Medical History  Diagnosis Date  . Chicken pox as a child  . Mumps as a child  . Anxiety and depression   . Hyperlipidemia   . Rectal bleeding 04/29/2011  . Urinary hesitancy 04/30/2011  . Hematuria 04/30/2011  . IBS (irritable bowel syndrome)   . Depression   . GERD (gastroesophageal reflux disease)   . Colon polyps 05/28/2011  . Insomnia 05/28/2011  . Cancer     face, arms, legs, and ear, SCC  .  Hemorrhoids 04/29/2011    Past Surgical History  Procedure Date  . Remove squamous cell     b/l legs  . Mohs surgery 2010    right ear, twice    History   Social History  . Marital Status: Married    Spouse Name: N/A    Number of Children: 2  . Years of Education: N/A   Occupational History  . Landscape    Social History Main Topics  . Smoking status: Never Smoker   . Smokeless tobacco: Never Used  . Alcohol Use: Yes     occasionally  . Drug Use: No  . Sexually Active: Yes   Other Topics Concern  . Not on file   Social History Narrative  . No narrative on file    Family History  Problem Relation Age of Onset  . COPD Mother   . Emphysema Mother   . Stroke Mother     X 2  . Heart disease Mother     CABG in her 71s  . Hyperlipidemia Mother   . Hypertension Mother   . Diabetes Mother     type 2  . Cancer Father     melanoma  . Stroke Father  1992  . Hypertension Father   . Alcohol abuse Father   . Diabetes Maternal Grandmother     type 2  . Heart disease Maternal Grandmother     CHF  . Heart attack Maternal Grandfather   . Heart disease Paternal Grandmother   . Heart disease Paternal Grandfather     ROS: hematochezia but no fevers or chills, productive cough, hemoptysis, dysphasia, odynophagia, melena,  dysuria, hematuria, rash, seizure activity, orthopnea, PND, pedal edema, claudication. Remaining systems are negative.  Physical Exam:   Blood pressure 129/85, pulse 73, height 5\' 9"  (1.753 m), weight 166 lb (75.297 kg).  General:  Well developed/well nourished in NAD Skin warm/dry Patient not depressed No peripheral clubbing Back-normal HEENT-normal/normal eyelids Neck supple/normal carotid upstroke bilaterally; no bruits; no JVD; no thyromegaly chest - CTA/ normal expansion CV - RRR/normal S1 and S2; no murmurs, rubs or gallops;  PMI nondisplaced Abdomen -NT/ND, no HSM, no mass, + bowel sounds, no bruit 2+ femoral pulses, no bruits Ext-no  edema, chords, 2+ DP Neuro-grossly nonfocal  ECG sinus rhythm at a rate of 73. Normal axis. No ST changes.

## 2011-12-04 ENCOUNTER — Other Ambulatory Visit: Payer: Self-pay

## 2011-12-04 DIAGNOSIS — IMO0001 Reserved for inherently not codable concepts without codable children: Secondary | ICD-10-CM

## 2011-12-04 DIAGNOSIS — F329 Major depressive disorder, single episode, unspecified: Secondary | ICD-10-CM

## 2011-12-04 DIAGNOSIS — F32A Depression, unspecified: Secondary | ICD-10-CM

## 2011-12-04 MED ORDER — RANITIDINE HCL 300 MG PO TABS: 300.0000 mg | ORAL_TABLET | Freq: Every day | ORAL | Status: DC

## 2011-12-04 MED ORDER — FLUOXETINE HCL 20 MG PO CAPS: 20.0000 mg | ORAL_CAPSULE | Freq: Three times a day (TID) | ORAL | Status: DC

## 2011-12-11 ENCOUNTER — Encounter: Payer: Self-pay | Admitting: Nurse Practitioner

## 2011-12-11 ENCOUNTER — Ambulatory Visit (INDEPENDENT_AMBULATORY_CARE_PROVIDER_SITE_OTHER): Payer: BC Managed Care – PPO | Admitting: Nurse Practitioner

## 2011-12-11 VITALS — BP 120/80 | HR 86 | Resp 20

## 2011-12-11 DIAGNOSIS — R079 Chest pain, unspecified: Secondary | ICD-10-CM

## 2011-12-11 NOTE — Procedures (Signed)
Exercise Treadmill Test  Pre-Exercise Testing Evaluation Rhythm: normal sinus  Rate: 65   PR:  .15 QRS:  .09  QT:  .38 QTc: .40     Test  Exercise Tolerance Test Ordering MD: Danise Edge , MD  Interpreting MD: Norma Fredrickson , NP  Unique Test No: 1  Treadmill:  1  Indication for ETT: chest pain - rule out ischemia  Contraindication to ETT: No   Stress Modality: exercise - treadmill  Cardiac Imaging Performed: non   Protocol: standard Bruce - maximal  Max BP:  181/105  Max MPHR (bpm):  174 85% MPR (bpm):  148  MPHR obtained (bpm):  160 % MPHR obtained:  92%  Reached 85% MPHR (min:sec):  12:23 Total Exercise Time (min-sec):  13:00  Workload in METS:  15.4 Borg Scale: 17  Reason ETT Terminated:  desired heart rate attained    ST Segment Analysis At Rest: normal ST segments - no evidence of significant ST depression With Exercise: no evidence of significant ST depression  Other Information Arrhythmia:  No Angina during ETT:  absent (0) Quality of ETT:  diagnostic  ETT Interpretation:  normal - no evidence of ischemia by ST analysis  Comments: Patient presents today for routine GXT. Seen for Dr. Jens Som. Has had some atypical chest pain. No history of early heart disease, tobacco, high blood pressure reported. Is on statin therapy.   Today he exercised on the standard Bruce protocol for a total of 13 minutes.  Excellent exercise tolerance. Adequate blood pressure response. Clinically negative for chest pain. Target heart rate was achieved.  EKG negative for ischemia.   Recommendations: He is felt to be stable from our standpoint. Will defer further management back to his PCP.  Patient is agreeable to this plan and will call if any problems develop in the interim.

## 2011-12-18 ENCOUNTER — Encounter: Payer: Self-pay | Admitting: *Deleted

## 2011-12-18 ENCOUNTER — Other Ambulatory Visit: Payer: Self-pay

## 2011-12-18 MED ORDER — BUPROPION HCL ER (XL) 150 MG PO TB24: 150.0000 mg | ORAL_TABLET | ORAL | Status: DC

## 2011-12-20 ENCOUNTER — Ambulatory Visit: Payer: BC Managed Care – PPO | Admitting: Family Medicine

## 2012-01-14 ENCOUNTER — Other Ambulatory Visit: Payer: Self-pay

## 2012-01-14 DIAGNOSIS — G4700 Insomnia, unspecified: Secondary | ICD-10-CM

## 2012-01-14 NOTE — Telephone Encounter (Signed)
OK to refill Temazepam with same strength, #30 3 rf

## 2012-01-14 NOTE — Telephone Encounter (Signed)
Please advise Temazepam refill? Last RX wrote on 09-12-11 quantity 30 with 3 refills.  If ok to fill send to [phone]

## 2012-01-15 MED ORDER — TEMAZEPAM 15 MG PO CAPS: 30.0000 mg | ORAL_CAPSULE | Freq: Every evening | ORAL | Status: DC | PRN

## 2012-04-20 ENCOUNTER — Ambulatory Visit: Payer: BC Managed Care – PPO | Admitting: Family Medicine

## 2012-04-21 ENCOUNTER — Ambulatory Visit (INDEPENDENT_AMBULATORY_CARE_PROVIDER_SITE_OTHER): Payer: 59 | Admitting: Family Medicine

## 2012-04-21 ENCOUNTER — Encounter: Payer: Self-pay | Admitting: Family Medicine

## 2012-04-21 ENCOUNTER — Ambulatory Visit: Payer: BC Managed Care – PPO | Admitting: Family Medicine

## 2012-04-21 VITALS — BP 128/90 | HR 70 | Temp 97.6°F | Ht 69.0 in | Wt 170.0 lb

## 2012-04-21 DIAGNOSIS — Z23 Encounter for immunization: Secondary | ICD-10-CM

## 2012-04-21 DIAGNOSIS — L578 Other skin changes due to chronic exposure to nonionizing radiation: Secondary | ICD-10-CM

## 2012-04-21 DIAGNOSIS — R079 Chest pain, unspecified: Secondary | ICD-10-CM

## 2012-04-21 DIAGNOSIS — F329 Major depressive disorder, single episode, unspecified: Secondary | ICD-10-CM

## 2012-04-21 DIAGNOSIS — E785 Hyperlipidemia, unspecified: Secondary | ICD-10-CM

## 2012-04-21 DIAGNOSIS — F341 Dysthymic disorder: Secondary | ICD-10-CM

## 2012-04-21 DIAGNOSIS — D179 Benign lipomatous neoplasm, unspecified: Secondary | ICD-10-CM

## 2012-04-21 MED ORDER — LORAZEPAM 0.5 MG PO TABS: 0.5000 mg | ORAL_TABLET | Freq: Two times a day (BID) | ORAL | Status: DC | PRN

## 2012-04-21 MED ORDER — TETANUS-DIPHTH-ACELL PERTUSSIS 5-2.5-18.5 LF-MCG/0.5 IM SUSP: 0.5000 mL | Freq: Once | INTRAMUSCULAR | Status: DC

## 2012-04-21 MED ORDER — ROSUVASTATIN CALCIUM 5 MG PO TABS: 5.0000 mg | ORAL_TABLET | Freq: Every day | ORAL | Status: DC

## 2012-04-21 NOTE — Assessment & Plan Note (Signed)
Myalgias do not improve and he stopped Crestor. We'll restart Crestor just 5 mg daily is given samples today.
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
## 2012-04-21 NOTE — Assessment & Plan Note (Signed)
Sharp and fleeting and generally occurring under extreme duress. Symptoms last nitroglycerin use. He is advised to use nitroglycerin he care if chest pains occur and persist.

## 2012-04-21 NOTE — Assessment & Plan Note (Signed)
Has appointment with dermatology later in the week. Has significant scaly and dry patches over his hands and arms

## 2012-04-21 NOTE — Assessment & Plan Note (Signed)
Asymptomatic offered reassurance.

## 2012-04-21 NOTE — Assessment & Plan Note (Signed)
20 psychiatry to further evaluate. For now we'll continue fluoxetine 3 times a day but will add lorazepam 0.5 mg twice a day to use as needed due to his recent increased stressors and symptoms. May continue temazepam each bedtime

## 2012-05-12 ENCOUNTER — Ambulatory Visit: Payer: 59 | Admitting: Family Medicine

## 2012-05-19 ENCOUNTER — Telehealth: Payer: Self-pay | Admitting: Family Medicine

## 2012-05-19 NOTE — Telephone Encounter (Signed)
Refill temazepam 15 mg  Last fill 04-18-2012

## 2012-05-19 NOTE — Telephone Encounter (Signed)
Please advise refill? Last RX was wrote on 01-15-12 quantity 30 with 3 refills.  If ok to fax send to [phone]

## 2012-05-19 NOTE — Telephone Encounter (Signed)
OK to refill Temazepam with same sig, same number and same number of refills

## 2012-05-20 MED ORDER — TEMAZEPAM 15 MG PO CAPS: 30.0000 mg | ORAL_CAPSULE | Freq: Every evening | ORAL | Status: DC | PRN

## 2012-05-20 NOTE — Telephone Encounter (Signed)
RX faxed

## 2012-05-27 ENCOUNTER — Other Ambulatory Visit: Payer: Self-pay | Admitting: Family Medicine

## 2012-06-29 ENCOUNTER — Other Ambulatory Visit: Payer: Self-pay | Admitting: Dermatology

## 2012-07-15 ENCOUNTER — Other Ambulatory Visit: Payer: Self-pay | Admitting: Family Medicine

## 2012-08-04 DIAGNOSIS — C4330 Malignant melanoma of unspecified part of face: Secondary | ICD-10-CM | POA: Insufficient documentation

## 2012-08-20 ENCOUNTER — Telehealth: Payer: Self-pay

## 2012-08-20 ENCOUNTER — Other Ambulatory Visit: Payer: Self-pay | Admitting: Family Medicine

## 2012-08-20 NOTE — Telephone Encounter (Signed)
FYI: Pts wife called stating pt had mole removed on back was melanoma stage 0 and is getting a freckle removed off of face that is Melanoma Stage 1- they have to meet with a plastic surgeon and they are taking lymph nodes also since its stage 1.   I scheduled an appt for 6-5 at 3 per pts spouse request. pts spouse states pt is sleeping all the time and won't talk to her about anything and hes never been like that towards her.

## 2012-08-24 ENCOUNTER — Encounter (HOSPITAL_BASED_OUTPATIENT_CLINIC_OR_DEPARTMENT_OTHER): Payer: Self-pay | Admitting: Emergency Medicine

## 2012-08-24 ENCOUNTER — Emergency Department (HOSPITAL_BASED_OUTPATIENT_CLINIC_OR_DEPARTMENT_OTHER)
Admission: EM | Admit: 2012-08-24 | Discharge: 2012-08-24 | Disposition: A | Discharge status: Home / Self Care | Payer: 59 | Attending: Emergency Medicine | Admitting: Emergency Medicine

## 2012-08-24 DIAGNOSIS — Y929 Unspecified place or not applicable: Secondary | ICD-10-CM | POA: Insufficient documentation

## 2012-08-24 DIAGNOSIS — Z85828 Personal history of other malignant neoplasm of skin: Secondary | ICD-10-CM | POA: Insufficient documentation

## 2012-08-24 DIAGNOSIS — F341 Dysthymic disorder: Secondary | ICD-10-CM | POA: Insufficient documentation

## 2012-08-24 DIAGNOSIS — G4700 Insomnia, unspecified: Secondary | ICD-10-CM | POA: Insufficient documentation

## 2012-08-24 DIAGNOSIS — Z87448 Personal history of other diseases of urinary system: Secondary | ICD-10-CM | POA: Insufficient documentation

## 2012-08-24 DIAGNOSIS — E785 Hyperlipidemia, unspecified: Secondary | ICD-10-CM | POA: Insufficient documentation

## 2012-08-24 DIAGNOSIS — S0191XA Laceration without foreign body of unspecified part of head, initial encounter: Secondary | ICD-10-CM

## 2012-08-24 DIAGNOSIS — Z7982 Long term (current) use of aspirin: Secondary | ICD-10-CM | POA: Insufficient documentation

## 2012-08-24 DIAGNOSIS — Z860100 Personal history of colon polyps, unspecified: Secondary | ICD-10-CM | POA: Insufficient documentation

## 2012-08-24 DIAGNOSIS — Z872 Personal history of diseases of the skin and subcutaneous tissue: Secondary | ICD-10-CM | POA: Insufficient documentation

## 2012-08-24 DIAGNOSIS — Z8679 Personal history of other diseases of the circulatory system: Secondary | ICD-10-CM | POA: Insufficient documentation

## 2012-08-24 DIAGNOSIS — Y939 Activity, unspecified: Secondary | ICD-10-CM | POA: Insufficient documentation

## 2012-08-24 DIAGNOSIS — K219 Gastro-esophageal reflux disease without esophagitis: Secondary | ICD-10-CM | POA: Insufficient documentation

## 2012-08-24 DIAGNOSIS — S0100XA Unspecified open wound of scalp, initial encounter: Secondary | ICD-10-CM | POA: Insufficient documentation

## 2012-08-24 DIAGNOSIS — Z8619 Personal history of other infectious and parasitic diseases: Secondary | ICD-10-CM | POA: Insufficient documentation

## 2012-08-24 DIAGNOSIS — Z8719 Personal history of other diseases of the digestive system: Secondary | ICD-10-CM | POA: Insufficient documentation

## 2012-08-24 DIAGNOSIS — Z79899 Other long term (current) drug therapy: Secondary | ICD-10-CM | POA: Insufficient documentation

## 2012-08-24 NOTE — ED Provider Notes (Signed)
Medical screening examination/treatment/procedure(s) were performed by non-physician practitioner and as supervising physician I was immediately available for consultation/collaboration.   Kule Gascoigne, MD 08/24/12 2336 

## 2012-08-24 NOTE — ED Notes (Signed)
Pt hit head on roll bar of go cart tonight. Small laceration to right sided of head. No LOC

## 2012-08-24 NOTE — ED Provider Notes (Signed)
History     CSN: [id number]  Arrival date & time 08/24/12  2041   First MD Initiated Contact with Patient 08/24/12 2200      Chief Complaint  Patient presents with  . Head Laceration    (Consider location/radiation/quality/duration/timing/severity/associated sxs/prior treatment) HPI Comments: Pt states that he got a laceration when hit his head on the roll bar getting out of the go cart:no loc:no n/v, blurred vision  Patient is a 47 y.o. male presenting with scalp laceration. The history is provided by the patient. No language interpreter was used.  Head Laceration This is a new problem. The current episode started today. The problem occurs constantly. The problem has been unchanged. Pertinent negatives include no fever, headaches or vomiting. Nothing aggravates the symptoms. He has tried nothing for the symptoms.    Past Medical History  Diagnosis Date  . Chicken pox as a child  . Mumps as a child  . Anxiety and depression   . Hyperlipidemia   . Rectal bleeding 04/29/2011  . Urinary hesitancy 04/30/2011  . Hematuria 04/30/2011  . IBS (irritable bowel syndrome)   . Depression   . GERD (gastroesophageal reflux disease)   . Colon polyps 05/28/2011  . Insomnia 05/28/2011  . Cancer     face, arms, legs, and ear, SCC  . Hemorrhoids 04/29/2011  . Lipoma 04/21/2012    On back near left shoulder blade and above right hip  . Sun-damaged skin 04/21/2012    Past Surgical History  Procedure Laterality Date  . Remove squamous cell      b/l legs  . Mohs surgery  2010    right ear, twice    Family History  Problem Relation Age of Onset  . COPD Mother   . Emphysema Mother   . Stroke Mother     X 2  . Heart disease Mother     CABG in her 17s  . Hyperlipidemia Mother   . Hypertension Mother   . Diabetes Mother     type 2  . Cancer Father     melanoma  . Stroke Father     61  . Hypertension Father   . Alcohol abuse Father   . Diabetes Maternal Grandmother     type 2  . Heart  disease Maternal Grandmother     CHF  . Heart attack Maternal Grandfather   . Heart disease Paternal Grandmother   . Heart disease Paternal Grandfather     History  Substance Use Topics  . Smoking status: Never Smoker   . Smokeless tobacco: Never Used  . Alcohol Use: Yes     Comment: occasionally      Review of Systems  Constitutional: Negative for fever.  Respiratory: Negative.   Cardiovascular: Negative.   Gastrointestinal: Negative for vomiting.  Neurological: Negative for headaches.    Allergies  Review of patient's allergies indicates no known allergies.  Home Medications   Current Outpatient Rx  Name  Route  Sig  Dispense  Refill  . aspirin 81 MG tablet   Oral   Take 81 mg by mouth daily.         Marland Kitchen buPROPion (WELLBUTRIN XL) 150 MG 24 hr tablet      TAKE 1 TABLET (150 MG TOTAL)  BY MOUTH EVERY MORNING.   30 tablet   4   . fish oil-omega-3 fatty acids 1000 MG capsule   Oral   Take 1 g by mouth daily.         Marland Kitchen  FLUoxetine (PROZAC) 20 MG capsule      TAKE 1 CAPSULE (20 MG TOTAL)  BY MOUTH 3 (THREE) TIMES DAILY.   90 capsule   4   . LORazepam (ATIVAN) 0.5 MG tablet   Oral   Take 1 tablet (0.5 mg total) by mouth 2 (two) times daily as needed for anxiety.   60 tablet   1   . nitroGLYCERIN (NITROSTAT) 0.4 MG SL tablet   Sublingual   Place 1 tablet (0.4 mg total) under the tongue every 5 (five) minutes as needed for chest pain. After 3 doses cp still present proceed to ER   25 tablet   3   . omeprazole (PRILOSEC) 40 MG capsule      TAKE 1 CAPSULE (40 MG TOTAL)  BY MOUTH DAILY.   30 capsule   10   . ranitidine (ZANTAC) 300 MG tablet      TAKE 1 TABLET (300 MG TOTAL)  BY MOUTH AT BEDTIME.   30 tablet   4   . rosuvastatin (CRESTOR) 5 MG tablet   Oral   Take 1 tablet (5 mg total) by mouth daily.   28 tablet   3   . temazepam (RESTORIL) 15 MG capsule   Oral   Take 2 capsules (30 mg total) by mouth at bedtime as needed.   30 capsule    3     BP 129/88  Pulse 71  Temp(Src) 98.1 F (36.7 C) (Oral)  Resp 18  SpO2 98%  Physical Exam  Nursing note and vitals reviewed. Constitutional: He is oriented to person, place, and time. He appears well-developed and well-nourished.  HENT:  Head: Normocephalic.  Right Ear: External ear normal.  Left Ear: External ear normal.  Eyes: Conjunctivae and EOM are normal. Pupils are equal, round, and reactive to light.  Neck: Normal range of motion. Neck supple.  Cardiovascular: Normal rate and regular rhythm.   Pulmonary/Chest: Effort normal and breath sounds normal.  Musculoskeletal: Normal range of motion.  Neurological: He is alert and oriented to person, place, and time.  Skin:  Laceration to the right scalp    ED Course  LACERATION REPAIR Date/Time: 08/24/2012 10:37 PM Performed by: Teressa Lower Authorized by: Teressa Lower Consent: Verbal consent obtained. Risks and benefits: risks, benefits and alternatives were discussed Consent given by: patient Patient identity confirmed: verbally with patient Time out: Immediately prior to procedure a "time out" was called to verify the correct patient, procedure, equipment, support staff and site/side marked as required. Body area: head/neck Location details: scalp Laceration length: 2 cm Anesthesia: local infiltration Local anesthetic: lidocaine 2% with epinephrine Irrigation solution: saline Irrigation method: tap Amount of cleaning: standard Skin closure: staples Approximation: close Approximation difficulty: simple Patient tolerance: Patient tolerated the procedure well with no immediate complications.   (including critical care time)  Labs Reviewed - No data to display No results found.   1. Laceration of head, initial encounter       MDM  Pt tetanus utd:pt tolerated without any problem        Teressa Lower, NP 08/24/12 2238

## 2012-08-26 DIAGNOSIS — K219 Gastro-esophageal reflux disease without esophagitis: Secondary | ICD-10-CM | POA: Insufficient documentation

## 2012-08-26 DIAGNOSIS — F329 Major depressive disorder, single episode, unspecified: Secondary | ICD-10-CM | POA: Insufficient documentation

## 2012-08-27 ENCOUNTER — Ambulatory Visit: Payer: 59 | Admitting: Family Medicine

## 2012-09-01 ENCOUNTER — Encounter: Payer: Self-pay | Admitting: Family Medicine

## 2012-09-01 ENCOUNTER — Ambulatory Visit (INDEPENDENT_AMBULATORY_CARE_PROVIDER_SITE_OTHER): Payer: 59 | Admitting: Family Medicine

## 2012-09-01 VITALS — BP 120/90 | HR 78 | Temp 97.9°F | Ht 69.0 in | Wt 166.0 lb

## 2012-09-01 DIAGNOSIS — R5381 Other malaise: Secondary | ICD-10-CM

## 2012-09-01 DIAGNOSIS — G4700 Insomnia, unspecified: Secondary | ICD-10-CM

## 2012-09-01 DIAGNOSIS — F419 Anxiety disorder, unspecified: Secondary | ICD-10-CM

## 2012-09-01 DIAGNOSIS — F341 Dysthymic disorder: Secondary | ICD-10-CM

## 2012-09-01 DIAGNOSIS — S0101XA Laceration without foreign body of scalp, initial encounter: Secondary | ICD-10-CM

## 2012-09-01 DIAGNOSIS — C801 Malignant (primary) neoplasm, unspecified: Secondary | ICD-10-CM

## 2012-09-01 DIAGNOSIS — C439 Malignant melanoma of skin, unspecified: Secondary | ICD-10-CM

## 2012-09-01 DIAGNOSIS — E785 Hyperlipidemia, unspecified: Secondary | ICD-10-CM

## 2012-09-01 DIAGNOSIS — S0100XA Unspecified open wound of scalp, initial encounter: Secondary | ICD-10-CM

## 2012-09-01 MED ORDER — RANITIDINE HCL 300 MG PO TABS: 300.0000 mg | ORAL_TABLET | Freq: Every day | ORAL | Status: DC

## 2012-09-01 NOTE — Patient Instructions (Addendum)

## 2012-09-02 LAB — RENAL FUNCTION PANEL
BUN: 13 mg/dL (ref 6–23)
CO2: 30 mEq/L (ref 19–32)
Chloride: 99 mEq/L (ref 96–112)
Glucose, Bld: 87 mg/dL (ref 70–99)
Potassium: 5 mEq/L (ref 3.5–5.3)
Sodium: 138 mEq/L (ref 135–145)

## 2012-09-02 LAB — CBC
Hemoglobin: 15.3 g/dL (ref 13.0–17.0)
MCH: 31.4 pg (ref 26.0–34.0)
MCHC: 34.2 g/dL (ref 30.0–36.0)
MCV: 91.8 fL (ref 78.0–100.0)
Platelets: 270 10*3/uL (ref 150–400)
RBC: 4.87 MIL/uL (ref 4.22–5.81)

## 2012-09-02 LAB — HEPATIC FUNCTION PANEL
ALT: 17 U/L (ref 0–53)
Alkaline Phosphatase: 94 U/L (ref 39–117)
Bilirubin, Direct: 0.1 mg/dL (ref 0.0–0.3)
Indirect Bilirubin: 0.3 mg/dL (ref 0.0–0.9)
Total Protein: 7.2 g/dL (ref 6.0–8.3)

## 2012-09-02 LAB — TESTOSTERONE: Testosterone: 620 ng/dL (ref 300–890)

## 2012-09-02 LAB — LIPID PANEL
Cholesterol: 277 mg/dL — ABNORMAL HIGH (ref 0–200)
Total CHOL/HDL Ratio: 7.3 Ratio
VLDL: 43 mg/dL — ABNORMAL HIGH (ref 0–40)

## 2012-09-05 ENCOUNTER — Encounter: Payer: Self-pay | Admitting: Family Medicine

## 2012-09-05 DIAGNOSIS — S0101XA Laceration without foreign body of scalp, initial encounter: Secondary | ICD-10-CM | POA: Insufficient documentation

## 2012-09-05 NOTE — Assessment & Plan Note (Addendum)
Continues to struggle with dep anxieyt and depression. No suicidal ideation but significant difficulty with concentration, anhedonia and fatigue. 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 Will continue Fluoxetine until he is through his surgery then may consider changing meds

## 2012-09-05 NOTE — Assessment & Plan Note (Addendum)
Several new skin cancers identified, 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 Stage 1 Melanoma on face is most concerning. They are going to remove some lymph nodes next week to evaluate for metastasis.

## 2012-09-05 NOTE — Progress Notes (Signed)
Patient ID: Aaron Dyer, male   DOB: 02/18/1966, 47 y.o.   MRN: [id number] AGUSTIN SWATEK [id number] 1966/03/16 09/05/2012      Progress Note-Follow Up  Subjective  Chief Complaint  Chief Complaint  Patient presents with  . Melanoma    stage 1 on face  . Sleeping Problem    sleeping alot  . remove staples    HPI  Patient is a 47 year old Caucasian male who is in today for multiple concerns. One is in new diagnosis of new skin cancers. The worst one is a stage I melanoma. He also is noted to have several squamous cell lesions on his face and hand. Has a lymph node dissection scheduled for next week. He has very heavy depression and anxiety and this new diagnosis of health. 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 He has significant fatigue and anhedonia. Difficulty concentrating and irritability. Has chronic myalgias and malaise as well.   Past Medical History  Diagnosis Date  . Chicken pox as a child  . Mumps as a child  . Anxiety and depression   . Hyperlipidemia   . Rectal bleeding 04/29/2011  . Urinary hesitancy 04/30/2011  . Hematuria 04/30/2011  . IBS (irritable bowel syndrome)   . Depression   . GERD (gastroesophageal reflux disease)   . Colon polyps 05/28/2011  . Insomnia 05/28/2011  . Cancer     face, arms, legs, and ear, SCC  . Hemorrhoids 04/29/2011  . Lipoma 04/21/2012    On back near left shoulder blade and above right hip  . Sun-damaged skin 04/21/2012  . Laceration of skin of scalp 09/05/2012    right    Past Surgical History  Procedure Laterality Date  . Remove squamous cell      b/l legs  . Mohs surgery  2010    right ear, twice    Family History  Problem Relation Age of Onset  . COPD Mother   . Emphysema Mother   . Stroke Mother     X 2  . Heart disease Mother     CABG in her 33s  . Hyperlipidemia Mother   . Hypertension Mother   . Diabetes Mother     type 2  . Cancer Father     melanoma  . Stroke Father     22  . Hypertension  Father   . Alcohol abuse Father   . Diabetes Maternal Grandmother     type 2  . Heart disease Maternal Grandmother     CHF  . Heart attack Maternal Grandfather   . Heart disease Paternal Grandmother   . Heart disease Paternal Grandfather     History   Social History  . Marital Status: Married    Spouse Name: N/A    Number of Children: 2  . Years of Education: N/A   Occupational History  . Landscape    Social History Main Topics  . Smoking status: Never Smoker   . Smokeless tobacco: Never Used  . Alcohol Use: Yes     Comment: occasionally  . Drug Use: No  . Sexually Active: Yes   Other Topics Concern  . Not on file   Social History Narrative  . No narrative on file    Current Outpatient Prescriptions on File Prior to Visit  Medication Sig Dispense Refill  . aspirin 81 MG tablet Take 81 mg by mouth daily.      Marland Kitchen buPROPion (WELLBUTRIN XL) 150 MG 24 hr tablet  TAKE 1 TABLET (150 MG TOTAL)  BY MOUTH EVERY MORNING.  30 tablet  4  . fish oil-omega-3 fatty acids 1000 MG capsule Take 1 g by mouth daily.      Marland Kitchen FLUoxetine (PROZAC) 20 MG capsule TAKE 1 CAPSULE (20 MG TOTAL)  BY MOUTH 3 (THREE) TIMES DAILY.  90 capsule  4  . nitroGLYCERIN (NITROSTAT) 0.4 MG SL tablet Place 1 tablet (0.4 mg total) under the tongue every 5 (five) minutes as needed for chest pain. After 3 doses cp still present proceed to ER  25 tablet  3  . omeprazole (PRILOSEC) 40 MG capsule TAKE 1 CAPSULE (40 MG TOTAL)  BY MOUTH DAILY.  30 capsule  10  . temazepam (RESTORIL) 15 MG capsule Take 2 capsules (30 mg total) by mouth at bedtime as needed.  30 capsule  3   No current facility-administered medications on file prior to visit.    No Known Allergies  Review of Systems  Review of Systems  Constitutional: Positive for malaise/fatigue. Negative for fever.  HENT: Negative for congestion.   Eyes: Positive for pain. Negative for discharge.  Respiratory: Negative for shortness of breath.   Cardiovascular:  Negative for chest pain, palpitations and leg swelling.  Gastrointestinal: Negative for nausea, abdominal pain and diarrhea.  Genitourinary: Negative for dysuria.  Musculoskeletal: Positive for myalgias and joint pain. Negative for falls.  Skin: Negative for rash.  Neurological: Negative for loss of consciousness and headaches.  Endo/Heme/Allergies: Negative for polydipsia.  Psychiatric/Behavioral: Positive for depression. Negative for suicidal ideas. The patient is nervous/anxious and has insomnia.     Objective  BP 120/90  Pulse 78  Temp(Src) 97.9 F (36.6 C) (Oral)  Ht 5\' 9"  (1.753 m)  Wt 166 lb 0.6 oz (75.315 kg)  BMI 24.51 kg/m2  SpO2 97%  Physical Exam  Physical Exam  Constitutional: He is oriented to person, place, and time and well-developed, well-nourished, and in no distress. No distress.  HENT:  Head: Normocephalic and atraumatic.  Eyes: Conjunctivae are normal.  Neck: Neck supple. No thyromegaly present.  Cardiovascular: Normal rate, regular rhythm and normal heart sounds.   No murmur heard. Pulmonary/Chest: Effort normal and breath sounds normal. No respiratory distress.  Abdominal: He exhibits no distension and no mass. There is no tenderness.  Musculoskeletal: He exhibits no edema.  Neurological: He is alert and oriented to person, place, and time.  Skin: Skin is warm.  Healing laceration with 3 staples in place over right scalp. No fluctuance, discharge or erythema noted  Psychiatric: Memory, affect and judgment normal.    Lab Results  Component Value Date   TSH 2.238 09/01/2012   Lab Results  Component Value Date   WBC 10.0 09/01/2012   HGB 15.3 09/01/2012   HCT 44.7 09/01/2012   MCV 91.8 09/01/2012   PLT 270 09/01/2012   Lab Results  Component Value Date   CREATININE 0.93 09/01/2012   BUN 13 09/01/2012   NA 138 09/01/2012   K 5.0 09/01/2012   CL 99 09/01/2012   CO2 30 09/01/2012   Lab Results  Component Value Date   ALT 17 09/01/2012   AST 17  09/01/2012   ALKPHOS 94 09/01/2012   BILITOT 0.4 09/01/2012   Lab Results  Component Value Date   CHOL 277* 09/01/2012   Lab Results  Component Value Date   HDL 38* 09/01/2012   Lab Results  Component Value Date   LDLCALC 196* 09/01/2012   Lab Results  Component Value  Date   TRIG 215* 09/01/2012   Lab Results  Component Value Date   CHOLHDL 7.3 09/01/2012     Assessment & Plan  Cancer Several new skin cancers identified, 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 Stage 1 Melanoma on face is most concerning. They are going to remove some lymph nodes next week to evaluate for metastasis.   Anxiety and depression Continues to struggle with dep anxieyt and depression. No suicidal ideation but significant difficulty with concentration, anhedonia and fatigue. 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 Will continue Fluoxetine until he is through his surgery then may consider changing meds  Insomnia Using Temazepam prn with some results  Laceration of skin of scalp He is here for removal of 3 staples he had placed in the ED s/p a fall. Tolerates removal well. Laceration healing well, no sign of infection. Wash gently with soap and water daily

## 2012-09-05 NOTE — Assessment & Plan Note (Signed)
He is here for removal of 3 staples he had placed in the ED s/p a fall. Tolerates removal well. Laceration healing well, no sign of infection. Wash gently with soap and water daily

## 2012-09-05 NOTE — Assessment & Plan Note (Signed)
Using Temazepam prn with some results

## 2012-09-14 ENCOUNTER — Encounter: Payer: Self-pay | Admitting: Family Medicine

## 2012-09-14 ENCOUNTER — Telehealth: Payer: Self-pay

## 2012-09-14 NOTE — Telephone Encounter (Signed)
Left a detailed message on cell phone stating that paperwork is at the front desk for him to pick up

## 2012-09-24 ENCOUNTER — Telehealth: Payer: Self-pay | Admitting: *Deleted

## 2012-09-24 DIAGNOSIS — G4700 Insomnia, unspecified: Secondary | ICD-10-CM

## 2012-09-24 MED ORDER — TEMAZEPAM 15 MG PO CAPS: 30.0000 mg | ORAL_CAPSULE | Freq: Every evening | ORAL | Status: DC | PRN

## 2012-09-24 NOTE — Telephone Encounter (Signed)
eScribe request for refill on Temazepam Last filled - 02.25.14, #30x3 Last AEX - 06.10.14 Next AEX - 4-wks Please advise on refills/SLS

## 2012-09-24 NOTE — Telephone Encounter (Signed)
OK to refill Temazepam with same strength, same sig, #30 with 3 rf

## 2012-09-24 NOTE — Telephone Encounter (Signed)
Rx request phoned to pharmacy/SLS  

## 2012-10-05 ENCOUNTER — Ambulatory Visit: Payer: 59 | Admitting: Family Medicine

## 2012-10-06 ENCOUNTER — Other Ambulatory Visit: Payer: Self-pay

## 2012-10-06 MED ORDER — PITAVASTATIN CALCIUM 2 MG PO TABS: 1.0000 | ORAL_TABLET | Freq: Every day | ORAL | Status: DC

## 2012-11-13 ENCOUNTER — Other Ambulatory Visit: Payer: Self-pay | Admitting: Family Medicine

## 2013-01-02 ENCOUNTER — Other Ambulatory Visit: Payer: Self-pay | Admitting: Family Medicine

## 2013-01-04 NOTE — Telephone Encounter (Signed)
eScribe request for refill on Wellbutrin Last filled - 04.23.14, #30x4 Last AEX - 06.10.14 Next AEX - 4 weeks, appt scheduled for 12.16.14 Please Advise/SLS

## 2013-01-28 ENCOUNTER — Other Ambulatory Visit: Payer: Self-pay

## 2013-02-02 ENCOUNTER — Telehealth: Payer: Self-pay | Admitting: *Deleted

## 2013-02-02 DIAGNOSIS — G4700 Insomnia, unspecified: Secondary | ICD-10-CM

## 2013-02-02 NOTE — Telephone Encounter (Signed)
Faxed refill request received from pharmacy for Temazepam Last filled by MD on 07.03.14, #30x3 Last AEX - 07.03.14 Next AEX - 4 Weeks, No f/u appt scheduled until 12.16.14 Please Advise/SLS

## 2013-02-02 NOTE — Telephone Encounter (Signed)
OK to refill Temazepam, 15 mg, 2 tabs po qhs prn insomnia Disp #30 2 rf

## 2013-02-04 MED ORDER — TEMAZEPAM 15 MG PO CAPS: 30.0000 mg | ORAL_CAPSULE | Freq: Every evening | ORAL | Status: DC | PRN

## 2013-02-04 NOTE — Telephone Encounter (Signed)
Rx request to phoned pharmacy/SLS

## 2013-02-04 NOTE — Addendum Note (Signed)
Addended by: Regis Bill on: 02/04/2013 12:53 PM   Modules accepted: Orders

## 2013-03-09 ENCOUNTER — Encounter: Payer: Self-pay | Admitting: Family Medicine

## 2013-03-09 ENCOUNTER — Ambulatory Visit (INDEPENDENT_AMBULATORY_CARE_PROVIDER_SITE_OTHER): Payer: 59 | Admitting: Family Medicine

## 2013-03-09 ENCOUNTER — Ambulatory Visit (HOSPITAL_BASED_OUTPATIENT_CLINIC_OR_DEPARTMENT_OTHER)
Admission: RE | Admit: 2013-03-09 | Discharge: 2013-03-09 | Disposition: A | Discharge status: Home / Self Care | Payer: 59 | Source: Ambulatory Visit | Attending: Family Medicine | Admitting: Family Medicine

## 2013-03-09 VITALS — BP 138/88 | HR 67 | Temp 98.1°F | Ht 69.0 in | Wt 170.1 lb

## 2013-03-09 DIAGNOSIS — M545 Low back pain, unspecified: Secondary | ICD-10-CM | POA: Insufficient documentation

## 2013-03-09 DIAGNOSIS — N529 Male erectile dysfunction, unspecified: Secondary | ICD-10-CM

## 2013-03-09 DIAGNOSIS — F341 Dysthymic disorder: Secondary | ICD-10-CM

## 2013-03-09 DIAGNOSIS — E785 Hyperlipidemia, unspecified: Secondary | ICD-10-CM

## 2013-03-09 DIAGNOSIS — M549 Dorsalgia, unspecified: Secondary | ICD-10-CM

## 2013-03-09 DIAGNOSIS — R030 Elevated blood-pressure reading, without diagnosis of hypertension: Secondary | ICD-10-CM

## 2013-03-09 DIAGNOSIS — F3289 Other specified depressive episodes: Secondary | ICD-10-CM

## 2013-03-09 DIAGNOSIS — F329 Major depressive disorder, single episode, unspecified: Secondary | ICD-10-CM

## 2013-03-09 DIAGNOSIS — R209 Unspecified disturbances of skin sensation: Secondary | ICD-10-CM | POA: Insufficient documentation

## 2013-03-09 DIAGNOSIS — K219 Gastro-esophageal reflux disease without esophagitis: Secondary | ICD-10-CM

## 2013-03-09 DIAGNOSIS — C4490 Unspecified malignant neoplasm of skin, unspecified: Secondary | ICD-10-CM

## 2013-03-09 DIAGNOSIS — R350 Frequency of micturition: Secondary | ICD-10-CM

## 2013-03-09 LAB — LIPID PANEL
Cholesterol: 285 mg/dL — ABNORMAL HIGH (ref 0–200)
HDL: 36 mg/dL — ABNORMAL LOW (ref >39)
Triglycerides: 218 mg/dL — ABNORMAL HIGH (ref <150)

## 2013-03-09 LAB — HEPATIC FUNCTION PANEL
AST: 15 U/L (ref 0–37)
Albumin: 4.9 g/dL (ref 3.5–5.2)
Bilirubin, Direct: 0.1 mg/dL (ref 0.0–0.3)
Total Bilirubin: 0.5 mg/dL (ref 0.3–1.2)

## 2013-03-09 LAB — RENAL FUNCTION PANEL
CO2: 29 mEq/L (ref 19–32)
Chloride: 100 mEq/L (ref 96–112)
Phosphorus: 3.2 mg/dL (ref 2.3–4.6)
Potassium: 5.1 mEq/L (ref 3.5–5.3)

## 2013-03-09 LAB — CBC
MCV: 92.5 fL (ref 78.0–100.0)
Platelets: 295 10*3/uL (ref 150–400)
RBC: 5.1 MIL/uL (ref 4.22–5.81)
RDW: 14.3 % (ref 11.5–15.5)
WBC: 11.3 10*3/uL — ABNORMAL HIGH (ref 4.0–10.5)

## 2013-03-09 LAB — TSH: TSH: 3.069 u[IU]/mL (ref 0.350–4.500)

## 2013-03-09 MED ORDER — SILDENAFIL CITRATE 100 MG PO TABS: 100.0000 mg | ORAL_TABLET | Freq: Every day | ORAL | Status: DC | PRN

## 2013-03-09 MED ORDER — BUPROPION HCL ER (XL) 300 MG PO TB24: 300.0000 mg | ORAL_TABLET | Freq: Every day | ORAL | Status: DC

## 2013-03-09 NOTE — Patient Instructions (Signed)

## 2013-03-09 NOTE — Progress Notes (Signed)
Pre visit review using our clinic review tool, if applicable. No additional management support is needed unless otherwise documented below in the visit note. 

## 2013-03-09 NOTE — Progress Notes (Signed)
Patient ID: Aaron Dyer, male   DOB: 1965-05-02, 47 y.o.   MRN: [id number] Aaron Dyer [id number] [birth year] 03/09/2013      Progress Note-Follow Up  Subjective  Chief Complaint  Chief Complaint  Patient presents with  . Follow-up    possible med refill    HPI  Patient is a 47 year old Caucasian male who is in today for followup. He has some new lesions on his back and his left leg which he is concerned about due to his history skin cancer. For itching or bleeding. Not painful. He also notes his anxiety and depression persist. A partially treated with his current medications but he still struggles with low mood. Denies suicidal ideation. No acute illness. No chest pain, palpitations, shortness of breath, GI or GU complaints. Has been struggling with back pain. Low back does have some urinary frequency  Past Medical History  Diagnosis Date  . Chicken pox as a child  . Mumps as a child  . Anxiety and depression   . Hyperlipidemia   . Rectal bleeding 04/29/2011  . Urinary hesitancy 04/30/2011  . Hematuria 04/30/2011  . IBS (irritable bowel syndrome)   . Depression   . GERD (gastroesophageal reflux disease)   . Colon polyps 05/28/2011  . Insomnia 05/28/2011  . Cancer     face, arms, legs, and ear, SCC  . Hemorrhoids 04/29/2011  . Lipoma 04/21/2012    On back near left shoulder blade and above right hip  . Sun-damaged skin 04/21/2012  . Laceration of skin of scalp 09/05/2012    right    Past Surgical History  Procedure Laterality Date  . Remove squamous cell      b/l legs  . Mohs surgery  2010    right ear, twice    Family History  Problem Relation Age of Onset  . COPD Mother   . Emphysema Mother   . Stroke Mother     X 2  . Heart disease Mother     CABG in her 73s  . Hyperlipidemia Mother   . Hypertension Mother   . Diabetes Mother     type 2  . Cancer Father     melanoma  . Stroke Father     84  . Hypertension Father   . Alcohol abuse Father   . Diabetes  Maternal Grandmother     type 2  . Heart disease Maternal Grandmother     CHF  . Heart attack Maternal Grandfather   . Heart disease Paternal Grandmother   . Heart disease Paternal Grandfather     History   Social History  . Marital Status: Married    Spouse Name: N/A    Number of Children: 2  . Years of Education: N/A   Occupational History  . Landscape    Social History Main Topics  . Smoking status: Never Smoker   . Smokeless tobacco: Never Used  . Alcohol Use: Yes     Comment: occasionally  . Drug Use: No  . Sexual Activity: Yes   Other Topics Concern  . Not on file   Social History Narrative  . No narrative on file    Current Outpatient Prescriptions on File Prior to Visit  Medication Sig Dispense Refill  . aspirin 81 MG tablet Take 81 mg by mouth daily.      Marland Kitchen buPROPion (WELLBUTRIN XL) 150 MG 24 hr tablet TAKE ONE TABLET BY MOUTH IN THE MORNING  30 tablet  2  .  fish oil-omega-3 fatty acids 1000 MG capsule Take 1 g by mouth daily.      Marland Kitchen FLUoxetine (PROZAC) 20 MG capsule TAKE 1 CAPSULE (20 MG TOTAL)   BY MOUTH 3 (THREE) TIMES DAILY.  90 capsule  3  . omeprazole (PRILOSEC) 40 MG capsule TAKE 1 CAPSULE (40 MG TOTAL)  BY MOUTH DAILY.  30 capsule  10  . ranitidine (ZANTAC) 300 MG tablet Take 1 tablet (300 mg total) by mouth at bedtime.  90 tablet  1  . ranitidine (ZANTAC) 300 MG tablet TAKE 1 TABLET (300 MG TOTAL)   BY MOUTH AT BEDTIME.  30 tablet  3  . temazepam (RESTORIL) 15 MG capsule Take 2 capsules (30 mg total) by mouth at bedtime as needed.  30 capsule  2  . nitroGLYCERIN (NITROSTAT) 0.4 MG SL tablet Place 1 tablet (0.4 mg total) under the tongue every 5 (five) minutes as needed for chest pain. After 3 doses cp still present proceed to ER  25 tablet  3   No current facility-administered medications on file prior to visit.    No Known Allergies  Review of Systems  Review of Systems  Constitutional: Positive for chills. Negative for fever and  malaise/fatigue.  HENT: Negative for congestion.   Eyes: Negative for discharge.  Respiratory: Negative for shortness of breath.   Cardiovascular: Negative for chest pain, palpitations and leg swelling.  Gastrointestinal: Negative for nausea, abdominal pain and diarrhea.  Genitourinary: Positive for urgency and frequency. Negative for dysuria and hematuria.  Musculoskeletal: Negative for falls.  Skin: Negative for rash.  Neurological: Negative for loss of consciousness and headaches.  Endo/Heme/Allergies: Negative for polydipsia.  Psychiatric/Behavioral: Negative for depression and suicidal ideas. The patient is not nervous/anxious and does not have insomnia.     Objective  BP 138/88  Pulse 67  Temp(Src) 98.1 F (36.7 C) (Oral)  Ht 5\' 9"  (1.753 m)  Wt 170 lb 1.9 oz (77.166 kg)  BMI 25.11 kg/m2  SpO2 97%  Physical Exam  Physical Exam  Constitutional: He is oriented to person, place, and time and well-developed, well-nourished, and in no distress. No distress.  HENT:  Head: Normocephalic and atraumatic.  Eyes: Conjunctivae are normal.  Neck: Neck supple. No thyromegaly present.  Cardiovascular: Normal rate, regular rhythm and normal heart sounds.   No murmur heard. Pulmonary/Chest: Effort normal and breath sounds normal. No respiratory distress.  Abdominal: He exhibits no distension and no mass. There is no tenderness.  Musculoskeletal: He exhibits no edema.  Neurological: He is alert and oriented to person, place, and time.  Skin: Skin is warm.  Psychiatric: Memory, affect and judgment normal.    Lab Results  Component Value Date   TSH 2.238 09/01/2012   Lab Results  Component Value Date   WBC 10.0 09/01/2012   HGB 15.3 09/01/2012   HCT 44.7 09/01/2012   MCV 91.8 09/01/2012   PLT 270 09/01/2012   Lab Results  Component Value Date   CREATININE 0.93 09/01/2012   BUN 13 09/01/2012   NA 138 09/01/2012   K 5.0 09/01/2012   CL 99 09/01/2012   CO2 30 09/01/2012   Lab  Results  Component Value Date   ALT 17 09/01/2012   AST 17 09/01/2012   ALKPHOS 94 09/01/2012   BILITOT 0.4 09/01/2012   Lab Results  Component Value Date   CHOL 277* 09/01/2012   Lab Results  Component Value Date   HDL 38* 09/01/2012   Lab Results  Component  Value Date   LDLCALC 196* 09/01/2012   Lab Results  Component Value Date   TRIG 215* 09/01/2012   Lab Results  Component Value Date   CHOLHDL 7.3 09/01/2012     Assessment & Plan  Reflux Adequately controlled on Omeprazole and Ranitidine.   Hyperlipidemia Avoid trans fats, take krill oil caps and increase exercise, start statin  Skin cancer Has some new spots on back and left leg. He agrees to contact dermatology to manage  Anxiety and depression Not completely managed, will increase Bupropion  Urinary frequency Urinalysis normal.   Back pain Xray shows some mild arthritic changes acute concerns. encourae topical treatments

## 2013-03-10 LAB — URINALYSIS
Glucose, UA: NEGATIVE mg/dL
Leukocytes, UA: NEGATIVE
Nitrite: NEGATIVE
Specific Gravity, Urine: 1.005 (ref 1.005–1.030)
pH: 7.5 (ref 5.0–8.0)

## 2013-03-10 LAB — URINE CULTURE
Colony Count: NO GROWTH
Organism ID, Bacteria: NO GROWTH

## 2013-03-15 ENCOUNTER — Encounter: Payer: Self-pay | Admitting: Family Medicine

## 2013-03-15 DIAGNOSIS — M549 Dorsalgia, unspecified: Secondary | ICD-10-CM | POA: Insufficient documentation

## 2013-03-15 NOTE — Assessment & Plan Note (Signed)
Avoid trans fats, take krill oil caps and increase exercise, start statin

## 2013-03-15 NOTE — Assessment & Plan Note (Signed)
Xray shows some mild arthritic changes acute concerns. encourae topical treatments

## 2013-03-15 NOTE — Assessment & Plan Note (Signed)
Urinalysis normal

## 2013-03-15 NOTE — Assessment & Plan Note (Signed)
Adequately controlled on Omeprazole and Ranitidine.

## 2013-03-15 NOTE — Assessment & Plan Note (Signed)
Not completely managed, will increase Bupropion

## 2013-03-15 NOTE — Assessment & Plan Note (Signed)
Has some new spots on back and left leg. He agrees to contact dermatology to manage

## 2013-03-16 MED ORDER — LOVASTATIN 10 MG PO TABS: 10.0000 mg | ORAL_TABLET | Freq: Every day | ORAL | Status: DC

## 2013-04-02 ENCOUNTER — Ambulatory Visit: Payer: 59 | Admitting: Family Medicine

## 2013-04-02 DIAGNOSIS — Z0289 Encounter for other administrative examinations: Secondary | ICD-10-CM

## 2013-04-07 ENCOUNTER — Other Ambulatory Visit: Payer: Self-pay | Admitting: Family Medicine

## 2013-04-07 DIAGNOSIS — G4700 Insomnia, unspecified: Secondary | ICD-10-CM

## 2013-04-07 DIAGNOSIS — F329 Major depressive disorder, single episode, unspecified: Secondary | ICD-10-CM

## 2013-04-07 DIAGNOSIS — F32A Depression, unspecified: Secondary | ICD-10-CM

## 2013-04-08 MED ORDER — OMEPRAZOLE 40 MG PO CPDR: 40.0000 mg | DELAYED_RELEASE_CAPSULE | Freq: Every day | ORAL | Status: DC

## 2013-04-08 MED ORDER — BUPROPION HCL ER (XL) 300 MG PO TB24: 300.0000 mg | ORAL_TABLET | Freq: Every day | ORAL | Status: DC

## 2013-04-08 MED ORDER — TEMAZEPAM 15 MG PO CAPS: 30.0000 mg | ORAL_CAPSULE | Freq: Every evening | ORAL | Status: DC | PRN

## 2013-04-08 MED ORDER — LOVASTATIN 10 MG PO TABS: 10.0000 mg | ORAL_TABLET | Freq: Every day | ORAL | Status: DC

## 2013-04-08 NOTE — Telephone Encounter (Signed)
OK to send in a 90 day supply for everything, but the Temazepam can only be done that way this one time

## 2013-04-08 NOTE — Telephone Encounter (Signed)
Faxed RX and sent pt a mychart message

## 2013-04-08 NOTE — Telephone Encounter (Signed)
Dr Charlett Blake I sent in the Bupropion, Omeprazole and Lovastatin 90 day supply.  Please advise the Temazepam being refilled for a 90 day supply?  Last RX was done 02-04-13 quantity 30 with 2 refills  If ok fax to [phone]

## 2013-07-15 ENCOUNTER — Other Ambulatory Visit: Payer: Self-pay | Admitting: Family Medicine

## 2013-07-16 NOTE — Telephone Encounter (Signed)
Can have 10 tabs but then he needs to come in in next couple of weeks for an appt.

## 2013-07-16 NOTE — Telephone Encounter (Signed)
Needs OV prior to additional refills.  

## 2013-07-16 NOTE — Telephone Encounter (Signed)
eScribe request from [facility] for refill on Temazepam Last filled - 01.15.15, #90x0 Last AEX - 12.16.14 Next AEX - 3 Weeks [No Show 01.09.15 appt, No future appt scheduled] Please Advise on refill/SLS

## 2013-07-19 NOTE — Telephone Encounter (Signed)
Please call and inform pt of md's note below

## 2013-07-22 NOTE — Telephone Encounter (Signed)
Left detailed message informing patient of medication refill and to call our office to schedule follow up within the next couple of weeks

## 2013-08-19 ENCOUNTER — Encounter: Payer: Self-pay | Admitting: Family Medicine

## 2013-08-19 ENCOUNTER — Ambulatory Visit (INDEPENDENT_AMBULATORY_CARE_PROVIDER_SITE_OTHER): Payer: BC Managed Care – HMO | Admitting: Family Medicine

## 2013-08-19 VITALS — BP 130/74 | HR 77 | Temp 98.2°F | Ht 69.0 in | Wt 167.0 lb

## 2013-08-19 DIAGNOSIS — E785 Hyperlipidemia, unspecified: Secondary | ICD-10-CM

## 2013-08-19 DIAGNOSIS — IMO0001 Reserved for inherently not codable concepts without codable children: Secondary | ICD-10-CM

## 2013-08-19 DIAGNOSIS — K219 Gastro-esophageal reflux disease without esophagitis: Secondary | ICD-10-CM

## 2013-08-19 DIAGNOSIS — F418 Other specified anxiety disorders: Secondary | ICD-10-CM

## 2013-08-19 DIAGNOSIS — C4490 Unspecified malignant neoplasm of skin, unspecified: Secondary | ICD-10-CM

## 2013-08-19 DIAGNOSIS — B958 Unspecified staphylococcus as the cause of diseases classified elsewhere: Secondary | ICD-10-CM

## 2013-08-19 DIAGNOSIS — G4700 Insomnia, unspecified: Secondary | ICD-10-CM

## 2013-08-19 DIAGNOSIS — F341 Dysthymic disorder: Secondary | ICD-10-CM

## 2013-08-19 DIAGNOSIS — R079 Chest pain, unspecified: Secondary | ICD-10-CM

## 2013-08-19 DIAGNOSIS — N529 Male erectile dysfunction, unspecified: Secondary | ICD-10-CM

## 2013-08-19 LAB — RENAL FUNCTION PANEL
ALBUMIN: 4.9 g/dL (ref 3.5–5.2)
BUN: 13 mg/dL (ref 6–23)
CHLORIDE: 101 meq/L (ref 96–112)
CO2: 28 meq/L (ref 19–32)
Calcium: 9.6 mg/dL (ref 8.4–10.5)
Creat: 1.21 mg/dL (ref 0.50–1.35)
Glucose, Bld: 87 mg/dL (ref 70–99)
Phosphorus: 3.6 mg/dL (ref 2.3–4.6)
Potassium: 4.6 mEq/L (ref 3.5–5.3)
Sodium: 136 mEq/L (ref 135–145)

## 2013-08-19 LAB — CBC
HEMATOCRIT: 43.2 % (ref 39.0–52.0)
Hemoglobin: 15.2 g/dL (ref 13.0–17.0)
MCH: 31.8 pg (ref 26.0–34.0)
MCHC: 35.2 g/dL (ref 30.0–36.0)
MCV: 90.4 fL (ref 78.0–100.0)
PLATELETS: 292 10*3/uL (ref 150–400)
RBC: 4.78 MIL/uL (ref 4.22–5.81)
RDW: 14.2 % (ref 11.5–15.5)
WBC: 12 10*3/uL — ABNORMAL HIGH (ref 4.0–10.5)

## 2013-08-19 LAB — LIPID PANEL
Cholesterol: 274 mg/dL — ABNORMAL HIGH (ref 0–200)
HDL: 31 mg/dL — ABNORMAL LOW (ref >39)
Total CHOL/HDL Ratio: 8.8 Ratio
Triglycerides: 551 mg/dL — ABNORMAL HIGH (ref <150)

## 2013-08-19 LAB — HEPATIC FUNCTION PANEL
ALBUMIN: 4.9 g/dL (ref 3.5–5.2)
ALT: 18 U/L (ref 0–53)
AST: 17 U/L (ref 0–37)
Alkaline Phosphatase: 85 U/L (ref 39–117)
BILIRUBIN TOTAL: 0.3 mg/dL (ref 0.2–1.2)
Bilirubin, Direct: 0.1 mg/dL (ref 0.0–0.3)
Indirect Bilirubin: 0.2 mg/dL (ref 0.2–1.2)
TOTAL PROTEIN: 7.1 g/dL (ref 6.0–8.3)

## 2013-08-19 LAB — TSH: TSH: 3.36 u[IU]/mL (ref 0.350–4.500)

## 2013-08-19 MED ORDER — FLUOXETINE HCL 20 MG PO CAPS: 20.0000 mg | ORAL_CAPSULE | Freq: Every day | ORAL | Status: DC

## 2013-08-19 MED ORDER — TEMAZEPAM 15 MG PO CAPS: 15.0000 mg | ORAL_CAPSULE | Freq: Every evening | ORAL | Status: DC | PRN

## 2013-08-19 MED ORDER — OMEPRAZOLE 40 MG PO CPDR: 40.0000 mg | DELAYED_RELEASE_CAPSULE | Freq: Every day | ORAL | Status: DC

## 2013-08-19 MED ORDER — RANITIDINE HCL 300 MG PO TABS: 300.0000 mg | ORAL_TABLET | Freq: Every day | ORAL | Status: DC

## 2013-08-19 MED ORDER — SIMVASTATIN 20 MG PO TABS: 20.0000 mg | ORAL_TABLET | Freq: Every day | ORAL | Status: DC

## 2013-08-19 MED ORDER — MUPIROCIN 2 % EX OINT: TOPICAL_OINTMENT | CUTANEOUS | Status: DC

## 2013-08-19 NOTE — Patient Instructions (Signed)

## 2013-08-19 NOTE — Progress Notes (Signed)
Pre visit review using our clinic review tool, if applicable. No additional management support is needed unless otherwise documented below in the visit note. 

## 2013-08-22 ENCOUNTER — Encounter: Payer: Self-pay | Admitting: Family Medicine

## 2013-08-22 DIAGNOSIS — N529 Male erectile dysfunction, unspecified: Secondary | ICD-10-CM

## 2013-08-22 HISTORY — DX: Male erectile dysfunction, unspecified: N52.9

## 2013-08-22 NOTE — Progress Notes (Signed)
Patient ID: Aaron Dyer, male   DOB: 08/21/1965, 48 y.o.   MRN: [id number] Aaron Dyer [id number] 1965/06/11 08/22/2013      Progress Note-Follow Up  Subjective  Chief Complaint  Chief Complaint  Patient presents with  . Follow-up    med refill    HPI  Patient is a 48 year old male in today for routine medical care. In today for followup. Is not taking his lovastatin as he felt it caused erectile dysfunction. He notes since stopping he has not had any further need for Viagra. Otherwise she reports no recent illness. Heartburn well controlled on current meds. Continues to struggle with anxiety depression symptoms are tolerable. Still unable to work. Has several new skin lesions and is scheduled to see dermatology soon. Denies CP/palp/SOB/HA/congestion/fevers/GI or GU c/o. Taking meds as prescribed  Past Medical History  Diagnosis Date  . Chicken pox as a child  . Mumps as a child  . Anxiety and depression   . Hyperlipidemia   . Rectal bleeding 04/29/2011  . Urinary hesitancy 04/30/2011  . Hematuria 04/30/2011  . IBS (irritable bowel syndrome)   . Depression   . GERD (gastroesophageal reflux disease)   . Colon polyps 05/28/2011  . Insomnia 05/28/2011  . Cancer     face, arms, legs, and ear, SCC  . Hemorrhoids 04/29/2011  . Lipoma 04/21/2012    On back near left shoulder blade and above right hip  . Sun-damaged skin 04/21/2012  . Laceration of skin of scalp 09/05/2012    right  . Skin cancer     face, arms, legs, and ear skin cancer Followed with surgeon who has retired per patient, has had 2 Mohs procedures on his right ear and 2 lesions excised, one form each leg.   Marland Kitchen Urinary frequency 04/30/2011  . Back pain 03/15/2013  . ED (erectile dysfunction) 08/22/2013    Past Surgical History  Procedure Laterality Date  . Remove squamous cell      b/l legs  . Mohs surgery  2010    right ear, twice    Family History  Problem Relation Age of Onset  . COPD Mother   . Emphysema Mother    . Stroke Mother     X 2  . Heart disease Mother     CABG in her 63s  . Hyperlipidemia Mother   . Hypertension Mother   . Diabetes Mother     type 2  . Cancer Father     melanoma  . Stroke Father     12  . Hypertension Father   . Alcohol abuse Father   . Diabetes Maternal Grandmother     type 2  . Heart disease Maternal Grandmother     CHF  . Heart attack Maternal Grandfather   . Heart disease Paternal Grandmother   . Heart disease Paternal Grandfather     History   Social History  . Marital Status: Married    Spouse Name: N/A    Number of Children: 2  . Years of Education: N/A   Occupational History  . Landscape    Social History Main Topics  . Smoking status: Never Smoker   . Smokeless tobacco: Never Used  . Alcohol Use: Yes     Comment: occasionally  . Drug Use: No  . Sexual Activity: Yes   Other Topics Concern  . Not on file   Social History Narrative  . No narrative on file    Current Outpatient Prescriptions  on File Prior to Visit  Medication Sig Dispense Refill  . aspirin 81 MG tablet Take 81 mg by mouth daily.      Marland Kitchen buPROPion (WELLBUTRIN XL) 300 MG 24 hr tablet Take 1 tablet (300 mg total) by mouth daily.  90 tablet  1  . fish oil-omega-3 fatty acids 1000 MG capsule Take 1 g by mouth daily.      . nitroGLYCERIN (NITROSTAT) 0.4 MG SL tablet Place 1 tablet (0.4 mg total) under the tongue every 5 (five) minutes as needed for chest pain. After 3 doses cp still present proceed to ER  25 tablet  3  . lovastatin (MEVACOR) 10 MG tablet Take 1 tablet (10 mg total) by mouth at bedtime.  90 tablet  1   No current facility-administered medications on file prior to visit.    Allergies  Allergen Reactions  . Crestor [Rosuvastatin]     myalgias    Review of Systems  Review of Systems  Constitutional: Negative for fever and malaise/fatigue.  HENT: Negative for congestion.   Eyes: Negative for discharge.  Respiratory: Negative for shortness of  breath.   Cardiovascular: Negative for chest pain, palpitations and leg swelling.  Gastrointestinal: Negative for nausea, abdominal pain and diarrhea.  Genitourinary: Negative for dysuria.  Musculoskeletal: Negative for falls.  Skin: Negative for rash.       New lesions on trunk, arms and legs  Neurological: Negative for loss of consciousness and headaches.  Endo/Heme/Allergies: Negative for polydipsia.  Psychiatric/Behavioral: Negative for depression and suicidal ideas. The patient is not nervous/anxious and does not have insomnia.     Objective  BP 130/74  Pulse 77  Temp(Src) 98.2 F (36.8 C) (Oral)  Ht 5\' 9"  (1.753 m)  Wt 167 lb 0.6 oz (75.769 kg)  BMI 24.66 kg/m2  SpO2 98%  Physical Exam  Physical Exam  Constitutional: He is oriented to person, place, and time and well-developed, well-nourished, and in no distress. No distress.  HENT:  Head: Normocephalic and atraumatic.  Eyes: Conjunctivae are normal.  Neck: Neck supple. No thyromegaly present.  Cardiovascular: Normal rate, regular rhythm and normal heart sounds.   No murmur heard. Pulmonary/Chest: Effort normal and breath sounds normal. No respiratory distress.  Abdominal: He exhibits no distension and no mass. There is no tenderness.  Musculoskeletal: He exhibits no edema.  Neurological: He is alert and oriented to person, place, and time.  Skin: Skin is warm.  Psychiatric: Memory, affect and judgment normal.    Lab Results  Component Value Date   TSH 3.360 08/19/2013   Lab Results  Component Value Date   WBC 12.0* 08/19/2013   HGB 15.2 08/19/2013   HCT 43.2 08/19/2013   MCV 90.4 08/19/2013   PLT 292 08/19/2013   Lab Results  Component Value Date   CREATININE 1.21 08/19/2013   BUN 13 08/19/2013   NA 136 08/19/2013   K 4.6 08/19/2013   CL 101 08/19/2013   CO2 28 08/19/2013   Lab Results  Component Value Date   ALT 18 08/19/2013   AST 17 08/19/2013   ALKPHOS 85 08/19/2013   BILITOT 0.3 08/19/2013   Lab  Results  Component Value Date   CHOL 274* 08/19/2013   Lab Results  Component Value Date   HDL 31* 08/19/2013   Lab Results  Component Value Date   LDLCALC NOT CALC 08/19/2013   Lab Results  Component Value Date   TRIG 551* 08/19/2013   Lab Results  Component Value Date  CHOLHDL 8.8 08/19/2013     Assessment & Plan  Hyperlipidemia Agrees to try Simvastatin 20 mg daily. Encouraged heart healthy diet, increase exercise, avoid trans fats, consider a krill oil cap daily  Reflux Avoid offending foods, start probiotics. Do not eat large meals in late evening and consider raising head of bed. Omeprazole and Omeprazole are continued  ED (erectile dysfunction) No need for Viagra since stopping Atorvastatin  Skin cancer Has some new lesions. Agrees to follow up with dermatology.  Insomnia Given rx for Temazepam. Encouraged good sleep hygiene such as dark, quiet room. No blue/green glowing lights such as computer screens in bedroom. No alcohol or stimulants in evening. Cut down on caffeine as able. Regular exercise is helpful but not just prior to bed time.

## 2013-08-22 NOTE — Assessment & Plan Note (Signed)
Has some new lesions. Agrees to follow up with dermatology.

## 2013-08-22 NOTE — Assessment & Plan Note (Signed)
Agrees to try Simvastatin 20 mg daily. Encouraged heart healthy diet, increase exercise, avoid trans fats, consider a krill oil cap daily

## 2013-08-22 NOTE — Assessment & Plan Note (Signed)
Avoid offending foods, start probiotics. Do not eat large meals in late evening and consider raising head of bed. Omeprazole and Omeprazole are continued

## 2013-08-22 NOTE — Assessment & Plan Note (Signed)
No need for Viagra since stopping Atorvastatin

## 2013-08-22 NOTE — Assessment & Plan Note (Signed)
Given rx for Temazepam. Encouraged good sleep hygiene such as dark, quiet room. No blue/green glowing lights such as computer screens in bedroom. No alcohol or stimulants in evening. Cut down on caffeine as able. Regular exercise is helpful but not just prior to bed time.

## 2013-10-04 ENCOUNTER — Encounter: Payer: Self-pay | Admitting: Nurse Practitioner

## 2013-10-04 ENCOUNTER — Telehealth: Payer: Self-pay | Admitting: Family Medicine

## 2013-10-04 ENCOUNTER — Ambulatory Visit (INDEPENDENT_AMBULATORY_CARE_PROVIDER_SITE_OTHER): Payer: BC Managed Care – HMO | Admitting: Nurse Practitioner

## 2013-10-04 VITALS — BP 116/73 | HR 80 | Temp 98.0°F | Ht 69.0 in | Wt 164.0 lb

## 2013-10-04 DIAGNOSIS — L0390 Cellulitis, unspecified: Principal | ICD-10-CM

## 2013-10-04 DIAGNOSIS — L0291 Cutaneous abscess, unspecified: Secondary | ICD-10-CM

## 2013-10-04 MED ORDER — CEPHALEXIN 500 MG PO CAPS: 500.0000 mg | ORAL_CAPSULE | Freq: Two times a day (BID) | ORAL | Status: DC

## 2013-10-04 NOTE — Progress Notes (Signed)
Pre visit review using our clinic review tool, if applicable. No additional management support is needed unless otherwise documented below in the visit note. 

## 2013-10-04 NOTE — Telephone Encounter (Signed)
Patient Information:  Caller Name: Shyler  Phone: [phone]  Patient: Aaron Dyer, Aaron Dyer  Gender: Male  DOB: 1965-09-08  Age: 48 Years  PCP: Penni Homans [facility])  Office Follow Up:  Does the office need to follow up with this patient?: No  Instructions For The Office: N/A  RN Note:  Temp unknown; feels chills. No appointments left at [facility]. Needs at least 30 minutes to reach [facility]. Scheduled for 1530 with Hinda Glatter, NP at [facility] office.    Symptoms  Reason For Call & Symptoms: Painful 1-2" cyst on left shoulder blade has ruptured; draining yellow and bloody ffluid.  Reviewed Health History In EMR: Yes  Reviewed Medications In EMR: Yes  Reviewed Allergies In EMR: Yes  Reviewed Surgeries / Procedures: Yes  Date of Onset of Symptoms: 10/03/2013  Guideline(s) Used:  Skin Lesion - Moles or Growths  Disposition Per Guideline:   See Today in Office  Reason For Disposition Reached:   Looks infected (e.g., spreading redness, pus, red streak)  Advice Given:  Call Back If:  Fever or pain occurs  Any change in the mole or growth  You become worse.  Patient Will Follow Care Advice:  YES  Appointment Scheduled:  10/04/2013 15:30:00 Appointment Scheduled Provider:  Other

## 2013-10-04 NOTE — Patient Instructions (Signed)
Allow shower to irrigate wound daily. Pat dry. Cover. Take antibiotic. Follow up in 1 weeks.  Abscess An abscess is an infected area that contains a collection of pus and debris.It can occur in almost any part of the body. An abscess is also known as a furuncle or boil. CAUSES  An abscess occurs when tissue gets infected. This can occur from blockage of oil or sweat glands, infection of hair follicles, or a minor injury to the skin. As the body tries to fight the infection, pus collects in the area and creates pressure under the skin. This pressure causes pain. People with weakened immune systems have difficulty fighting infections and get certain abscesses more often.  SYMPTOMS Usually an abscess develops on the skin and becomes a painful mass that is red, warm, and tender. If the abscess forms under the skin, you may feel a moveable soft area under the skin. Some abscesses break open (rupture) on their own, but most will continue to get worse without care. The infection can spread deeper into the body and eventually into the bloodstream, causing you to feel ill.  DIAGNOSIS  Your caregiver will take your medical history and perform a physical exam. A sample of fluid may also be taken from the abscess to determine what is causing your infection. TREATMENT  Your caregiver may prescribe antibiotic medicines to fight the infection. However, taking antibiotics alone usually does not cure an abscess. Your caregiver may need to make a small cut (incision) in the abscess to drain the pus. In some cases, gauze is packed into the abscess to reduce pain and to continue draining the area. HOME CARE INSTRUCTIONS   Only take over-the-counter or prescription medicines for pain, discomfort, or fever as directed by your caregiver.  If you were prescribed antibiotics, take them as directed. Finish them even if you start to feel better.  If gauze is used, follow your caregiver's directions for changing the  gauze.  To avoid spreading the infection:  Keep your draining abscess covered with a bandage.  Wash your hands well.  Do not share personal care items, towels, or whirlpools with others.  Avoid skin contact with others.  Keep your skin and clothes clean around the abscess.  Keep all follow-up appointments as directed by your caregiver. SEEK MEDICAL CARE IF:   You have increased pain, swelling, redness, fluid drainage, or bleeding.  You have muscle aches, chills, or a general ill feeling.  You have a fever. MAKE SURE YOU:   Understand these instructions.  Will watch your condition.  Will get help right away if you are not doing well or get worse. Document Released: 12/19/2004 Document Revised: 09/10/2011 Document Reviewed: 05/24/2011 [facility] Patient Information 2015 [place], [place]. This information is not intended to replace advice given to you by your health care provider. Make sure you discuss any questions you have with your health care provider.

## 2013-10-04 NOTE — Progress Notes (Signed)
Subjective:     Aaron Dyer is a 48 y.o. male who presents for evaluation of a subcutaneous nodule located over the back.  This has been present for several weeks. But in last week it has doubled in size. Today, he hit his back & cyst ruptured, spilling purulent sanguinous drainage.  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  The following portions of the patient's history were reviewed and updated as appropriate: allergies, current medications, past medical history, past social history, past surgical history and problem list.  Review of Systems Pertinent items are noted in HPI.    Objective:    BP 116/73  Pulse 80  Temp(Src) 98 F (36.7 C) (Temporal)  Ht 5\' 9"  (1.753 m)  Wt 164 lb (74.39 kg)  BMI 24.21 kg/m2  SpO2 97% Physical Exam  Skin: 1 centimeter subcutaneous nodule over the back. The lesion is fully mobile. There is moderate erythema.  There is moderate tenderness.  Procedure: Skin cleansed with 3 betadine swabs. Skin over cyst numbed using 2 % lidocaine w/epi administered with 25 ga 1 1/2" needle. 0.5 " Incision made using surgical blade. Moderate amount of Caseous material expressed, pt tolerating, but having discomfort. Lesion reduced to 3/4 size. Still has hard palpable nodule. Irrigated with saline. Applied bacitracin. Covered with band-aid.  Assessment:    Epidermal inclusion cyst of the back.    Plan:    1. I have discussed treatment options consisting of observation or excision under local anesthesia.   2. Patient is inclined to proceed with excision. 3. Verbal patient instruction given. Keflex. Skin care discussed. 4. Follow up in 10 days

## 2013-10-12 ENCOUNTER — Ambulatory Visit: Payer: BC Managed Care – HMO | Admitting: Nurse Practitioner

## 2013-11-19 ENCOUNTER — Ambulatory Visit: Payer: BC Managed Care – HMO | Admitting: Family Medicine

## 2013-11-25 ENCOUNTER — Encounter: Payer: Self-pay | Admitting: Family Medicine

## 2013-11-25 ENCOUNTER — Encounter (HOSPITAL_BASED_OUTPATIENT_CLINIC_OR_DEPARTMENT_OTHER): Payer: Self-pay

## 2013-11-25 ENCOUNTER — Ambulatory Visit (INDEPENDENT_AMBULATORY_CARE_PROVIDER_SITE_OTHER): Payer: BC Managed Care – HMO | Admitting: Family Medicine

## 2013-11-25 ENCOUNTER — Ambulatory Visit (HOSPITAL_BASED_OUTPATIENT_CLINIC_OR_DEPARTMENT_OTHER)
Admission: RE | Admit: 2013-11-25 | Discharge: 2013-11-25 | Disposition: A | Discharge status: Home / Self Care | Payer: BC Managed Care – HMO | Source: Ambulatory Visit | Attending: Family Medicine | Admitting: Family Medicine

## 2013-11-25 VITALS — BP 100/60 | HR 68 | Temp 98.1°F | Ht 69.0 in | Wt 165.0 lb

## 2013-11-25 DIAGNOSIS — R079 Chest pain, unspecified: Secondary | ICD-10-CM

## 2013-11-25 DIAGNOSIS — M47812 Spondylosis without myelopathy or radiculopathy, cervical region: Secondary | ICD-10-CM | POA: Diagnosis not present

## 2013-11-25 DIAGNOSIS — F418 Other specified anxiety disorders: Secondary | ICD-10-CM

## 2013-11-25 DIAGNOSIS — F329 Major depressive disorder, single episode, unspecified: Secondary | ICD-10-CM | POA: Diagnosis not present

## 2013-11-25 DIAGNOSIS — F32A Depression, unspecified: Secondary | ICD-10-CM

## 2013-11-25 DIAGNOSIS — G4700 Insomnia, unspecified: Secondary | ICD-10-CM

## 2013-11-25 DIAGNOSIS — F341 Dysthymic disorder: Secondary | ICD-10-CM

## 2013-11-25 DIAGNOSIS — M542 Cervicalgia: Secondary | ICD-10-CM

## 2013-11-25 DIAGNOSIS — E785 Hyperlipidemia, unspecified: Secondary | ICD-10-CM

## 2013-11-25 DIAGNOSIS — IMO0001 Reserved for inherently not codable concepts without codable children: Secondary | ICD-10-CM

## 2013-11-25 DIAGNOSIS — F3289 Other specified depressive episodes: Secondary | ICD-10-CM | POA: Diagnosis not present

## 2013-11-25 DIAGNOSIS — R350 Frequency of micturition: Secondary | ICD-10-CM

## 2013-11-25 DIAGNOSIS — R209 Unspecified disturbances of skin sensation: Secondary | ICD-10-CM | POA: Diagnosis present

## 2013-11-25 DIAGNOSIS — K219 Gastro-esophageal reflux disease without esophagitis: Secondary | ICD-10-CM

## 2013-11-25 MED ORDER — TEMAZEPAM 15 MG PO CAPS: 15.0000 mg | ORAL_CAPSULE | Freq: Every evening | ORAL | Status: DC | PRN

## 2013-11-25 MED ORDER — BUPROPION HCL ER (XL) 300 MG PO TB24: 300.0000 mg | ORAL_TABLET | Freq: Every day | ORAL | Status: DC

## 2013-11-25 MED ORDER — OMEPRAZOLE 40 MG PO CPDR: 40.0000 mg | DELAYED_RELEASE_CAPSULE | Freq: Every day | ORAL | Status: DC

## 2013-11-25 MED ORDER — FLUOXETINE HCL 20 MG PO CAPS: 20.0000 mg | ORAL_CAPSULE | Freq: Every day | ORAL | Status: DC

## 2013-11-25 MED ORDER — SIMVASTATIN 20 MG PO TABS: 20.0000 mg | ORAL_TABLET | Freq: Every day | ORAL | Status: DC

## 2013-11-25 MED ORDER — RANITIDINE HCL 300 MG PO TABS: 300.0000 mg | ORAL_TABLET | Freq: Every day | ORAL | Status: DC

## 2013-11-25 NOTE — Patient Instructions (Signed)

## 2013-11-25 NOTE — Progress Notes (Signed)
Pre visit review using our clinic review tool, if applicable. No additional management support is needed unless otherwise documented below in the visit note. 

## 2013-11-26 LAB — URINALYSIS
BILIRUBIN URINE: NEGATIVE
Hgb urine dipstick: NEGATIVE
KETONES UR: NEGATIVE
Leukocytes, UA: NEGATIVE
Nitrite: NEGATIVE
PH: 7 (ref 5.0–8.0)
TOTAL PROTEIN, URINE-UPE24: NEGATIVE
Urine Glucose: NEGATIVE
Urobilinogen, UA: 0.2 (ref 0.0–1.0)

## 2013-11-26 LAB — HEPATIC FUNCTION PANEL
ALBUMIN: 4.1 g/dL (ref 3.5–5.2)
ALK PHOS: 80 U/L (ref 39–117)
ALT: 22 U/L (ref 0–53)
AST: 19 U/L (ref 0–37)
BILIRUBIN TOTAL: 0.7 mg/dL (ref 0.2–1.2)
Bilirubin, Direct: 0.1 mg/dL (ref 0.0–0.3)
Total Protein: 6.9 g/dL (ref 6.0–8.3)

## 2013-11-26 LAB — CBC
HEMATOCRIT: 42.8 % (ref 39.0–52.0)
HEMOGLOBIN: 14.4 g/dL (ref 13.0–17.0)
MCHC: 33.7 g/dL (ref 30.0–36.0)
MCV: 95 fl (ref 78.0–100.0)
PLATELETS: 227 10*3/uL (ref 150.0–400.0)
RBC: 4.51 Mil/uL (ref 4.22–5.81)
RDW: 14.4 % (ref 11.5–15.5)
WBC: 9.8 10*3/uL (ref 4.0–10.5)

## 2013-11-26 LAB — LDL CHOLESTEROL, DIRECT: Direct LDL: 209.1 mg/dL

## 2013-11-26 LAB — LIPID PANEL
Cholesterol: 283 mg/dL — ABNORMAL HIGH (ref 0–200)
HDL: 30.2 mg/dL — ABNORMAL LOW (ref >39.00)
NonHDL: 252.8
TRIGLYCERIDES: 381 mg/dL — AB (ref 0.0–149.0)
Total CHOL/HDL Ratio: 9
VLDL: 76.2 mg/dL — AB (ref 0.0–40.0)

## 2013-11-26 LAB — RENAL FUNCTION PANEL
Albumin: 4.1 g/dL (ref 3.5–5.2)
BUN: 10 mg/dL (ref 6–23)
CHLORIDE: 100 meq/L (ref 96–112)
CO2: 30 meq/L (ref 19–32)
Calcium: 9.4 mg/dL (ref 8.4–10.5)
Creatinine, Ser: 1 mg/dL (ref 0.4–1.5)
GFR: 82.56 mL/min (ref >60.00)
Glucose, Bld: 84 mg/dL (ref 70–99)
Phosphorus: 4.2 mg/dL (ref 2.3–4.6)
Potassium: 4 mEq/L (ref 3.5–5.1)
SODIUM: 136 meq/L (ref 135–145)

## 2013-11-26 LAB — PSA: PSA: 0.42 ng/mL (ref 0.10–4.00)

## 2013-11-26 LAB — TSH: TSH: 3.05 u[IU]/mL (ref 0.35–4.50)

## 2013-11-27 LAB — URINE CULTURE
Colony Count: NO GROWTH
Organism ID, Bacteria: NO GROWTH

## 2013-11-29 ENCOUNTER — Encounter: Payer: Self-pay | Admitting: Family Medicine

## 2013-11-29 DIAGNOSIS — M542 Cervicalgia: Secondary | ICD-10-CM | POA: Insufficient documentation

## 2013-11-29 DIAGNOSIS — F418 Other specified anxiety disorders: Secondary | ICD-10-CM | POA: Insufficient documentation

## 2013-11-29 NOTE — Assessment & Plan Note (Signed)
Urine culture unremarkable. Due to some hesitancy a PSA was checked and is notmal

## 2013-11-29 NOTE — Assessment & Plan Note (Signed)
Tolerating statin, encouraged heart healthy diet, avoid trans fats, minimize simple carbs and saturated fats. Increase exercise as tolerated 

## 2013-11-29 NOTE — Assessment & Plan Note (Signed)
Encouraged good sleep hygiene such as dark, quiet room. No blue/green glowing lights such as computer screens in bedroom. No alcohol or stimulants in evening. Cut down on caffeine as able. Regular exercise is helpful but not just prior to bed time. May use Temazepam prn.  

## 2013-11-29 NOTE — Progress Notes (Signed)
Patient ID: Aaron Dyer, male   DOB: [birth year], 48 y.o.   MRN: [id number] WHITTEN ANDREONI [id number] 08/11/1965 11/29/2013      Progress Note-Follow Up  Subjective  Chief Complaint  Chief Complaint  Patient presents with  . Follow-up    3 month    HPI  Patient is a 48 year old male in today for routine medical care. He is in today with numerous concerns. One is of some recent urinary hesitancy but no dysuria or abdominal pain. He has been noticing some episodes of atypical chest pain that occur when he is awake and last anywhere from 1-30 minutes. There is a squeezing sensation and a slight sensation of feeling sweaty, he is struggling with a non  Productive cough as well. No f/c/n/v/d. Is noting some increased neck pain, notes some intermittent paresthesias numbness in arms and legs at times but no obvious pattern as to when this occurs. Work has been very stressful with very busy schedule. Denies palp/SOB/HA/congestion/fevers. Taking meds as prescribed  Past Medical History  Diagnosis Date  . Chicken pox as a child  . Mumps as a child  . Anxiety and depression   . Hyperlipidemia   . Rectal bleeding 04/29/2011  . Urinary hesitancy 04/30/2011  . Hematuria 04/30/2011  . IBS (irritable bowel syndrome)   . Depression   . GERD (gastroesophageal reflux disease)   . Colon polyps 05/28/2011  . Insomnia 05/28/2011  . Cancer     face, arms, legs, and ear, SCC  . Hemorrhoids 04/29/2011  . Lipoma 04/21/2012    On back near left shoulder blade and above right hip  . Sun-damaged skin 04/21/2012  . Laceration of skin of scalp 09/05/2012    right  . Skin cancer     face, arms, legs, and ear skin cancer Followed with surgeon who has retired per patient, has had 2 Mohs procedures on his right ear and 2 lesions excised, one form each leg.   Marland Kitchen Urinary frequency 04/30/2011  . Back pain 03/15/2013  . ED (erectile dysfunction) 08/22/2013    Past Surgical History  Procedure Laterality Date  . Remove  squamous cell      b/l legs  . Mohs surgery  2010    right ear, twice    Family History  Problem Relation Age of Onset  . COPD Mother   . Emphysema Mother   . Stroke Mother     X 2  . Heart disease Mother     CABG in her 34s  . Hyperlipidemia Mother   . Hypertension Mother   . Diabetes Mother     type 2  . Cancer Father     melanoma  . Stroke Father     80  . Hypertension Father   . Alcohol abuse Father   . Diabetes Maternal Grandmother     type 2  . Heart disease Maternal Grandmother     CHF  . Heart attack Maternal Grandfather   . Heart disease Paternal Grandmother   . Heart disease Paternal Grandfather     History   Social History  . Marital Status: Married    Spouse Name: N/A    Number of Children: 2  . Years of Education: N/A   Occupational History  . Landscape    Social History Main Topics  . Smoking status: Never Smoker   . Smokeless tobacco: Never Used  . Alcohol Use: Yes     Comment: occasionally  . Drug Use:  No  . Sexual Activity: Yes   Other Topics Concern  . Not on file   Social History Narrative  . No narrative on file    Current Outpatient Prescriptions on File Prior to Visit  Medication Sig Dispense Refill  . aspirin 81 MG tablet Take 81 mg by mouth daily.      . fish oil-omega-3 fatty acids 1000 MG capsule Take 1 g by mouth daily.      . nitroGLYCERIN (NITROSTAT) 0.4 MG SL tablet Place 1 tablet (0.4 mg total) under the tongue every 5 (five) minutes as needed for chest pain. After 3 doses cp still present proceed to ER  25 tablet  3   No current facility-administered medications on file prior to visit.    Allergies  Allergen Reactions  . Crestor [Rosuvastatin]     myalgias    Review of Systems  Review of Systems  Constitutional: Negative for fever and malaise/fatigue.  HENT: Negative for congestion.   Eyes: Negative for discharge.  Respiratory: Negative for shortness of breath.   Cardiovascular: Positive for chest pain.  Negative for palpitations and leg swelling.  Gastrointestinal: Positive for heartburn and nausea. Negative for abdominal pain and diarrhea.  Genitourinary: Negative for dysuria.  Musculoskeletal: Negative for falls.  Skin: Negative for rash.  Neurological: Positive for tingling. Negative for loss of consciousness and headaches.       B/l upper extremities  Endo/Heme/Allergies: Negative for polydipsia.  Psychiatric/Behavioral: Positive for depression. Negative for suicidal ideas. The patient is nervous/anxious. The patient does not have insomnia.     Objective  BP 100/60  Pulse 68  Temp(Src) 98.1 F (36.7 C) (Oral)  Ht 5\' 9"  (1.753 m)  Wt 165 lb (74.844 kg)  BMI 24.36 kg/m2  SpO2 97%  Physical Exam  Physical Exam  Constitutional: He is oriented to person, place, and time and well-developed, well-nourished, and in no distress. No distress.  HENT:  Head: Normocephalic and atraumatic.  Eyes: Conjunctivae are normal.  Neck: Neck supple. No thyromegaly present.  Cardiovascular: Normal rate, regular rhythm and normal heart sounds.   No murmur heard. Pulmonary/Chest: Effort normal and breath sounds normal. No respiratory distress.  Abdominal: He exhibits no distension and no mass. There is no tenderness.  Musculoskeletal: He exhibits no edema.  Neurological: He is alert and oriented to person, place, and time.  Skin: Skin is warm.  Psychiatric: Memory, affect and judgment normal.    Lab Results  Component Value Date   TSH 3.05 11/25/2013   Lab Results  Component Value Date   WBC 9.8 11/25/2013   HGB 14.4 11/25/2013   HCT 42.8 11/25/2013   MCV 95.0 11/25/2013   PLT 227.0 11/25/2013   Lab Results  Component Value Date   CREATININE 1.0 11/25/2013   BUN 10 11/25/2013   NA 136 11/25/2013   K 4.0 11/25/2013   CL 100 11/25/2013   CO2 30 11/25/2013   Lab Results  Component Value Date   ALT 22 11/25/2013   AST 19 11/25/2013   ALKPHOS 80 11/25/2013   BILITOT 0.7 11/25/2013   Lab Results  Component  Value Date   CHOL 283* 11/25/2013   Lab Results  Component Value Date   HDL 30.20* 11/25/2013   Lab Results  Component Value Date   LDLCALC NOT CALC 08/19/2013   Lab Results  Component Value Date   TRIG 381.0* 11/25/2013   Lab Results  Component Value Date   CHOLHDL 9 11/25/2013  Assessment & Plan  Hyperlipidemia Tolerating statin, encouraged heart healthy diet, avoid trans fats, minimize simple carbs and saturated fats. Increase exercise as tolerated  Reflux Likely contributin gto atypical chest pain, restart meds and Avoid offending foods, start probiotics. Do not eat large meals in late evening and consider raising head of bed.   Neck pain, bilateral With b/l upper extremity paresthesias at times. Encouraged moist heat, gentle stretching and topical treatments if no improvement would benefit from referral xray of neck shows only mild degenerative changes  Urinary frequency Urine culture unremarkable. Due to some hesitancy a PSA was checked and is notmal  Insomnia Encouraged good sleep hygiene such as dark, quiet room. No blue/green glowing lights such as computer screens in bedroom. No alcohol or stimulants in evening. Cut down on caffeine as able. Regular exercise is helpful but not just prior to bed time. May use Temazepam prn

## 2013-11-29 NOTE — Assessment & Plan Note (Signed)
Likely contributin gto atypical chest pain, restart meds and Avoid offending foods, start probiotics. Do not eat large meals in late evening and consider raising head of bed.

## 2013-11-29 NOTE — Assessment & Plan Note (Signed)
Atypical previous negative for CAD, will treat anxiety and reflux if pain occurs and does not resolve will need to present to ED

## 2013-11-29 NOTE — Assessment & Plan Note (Addendum)
With b/l upper extremity paresthesias at times. Encouraged moist heat, gentle stretching and topical treatments if no improvement would benefit from referral xray of neck shows only mild degenerative changes

## 2014-02-24 ENCOUNTER — Ambulatory Visit: Payer: BC Managed Care – HMO | Admitting: Family Medicine

## 2014-02-25 ENCOUNTER — Ambulatory Visit: Payer: BC Managed Care – HMO | Admitting: Family Medicine

## 2014-04-04 ENCOUNTER — Ambulatory Visit (INDEPENDENT_AMBULATORY_CARE_PROVIDER_SITE_OTHER): Payer: BLUE CROSS/BLUE SHIELD | Admitting: Family Medicine

## 2014-04-04 ENCOUNTER — Encounter: Payer: Self-pay | Admitting: Family Medicine

## 2014-04-04 VITALS — BP 121/82 | HR 71 | Temp 98.2°F | Ht 69.0 in | Wt 165.2 lb

## 2014-04-04 DIAGNOSIS — R5382 Chronic fatigue, unspecified: Secondary | ICD-10-CM

## 2014-04-04 DIAGNOSIS — IMO0001 Reserved for inherently not codable concepts without codable children: Secondary | ICD-10-CM

## 2014-04-04 DIAGNOSIS — F329 Major depressive disorder, single episode, unspecified: Secondary | ICD-10-CM

## 2014-04-04 DIAGNOSIS — K219 Gastro-esophageal reflux disease without esophagitis: Secondary | ICD-10-CM

## 2014-04-04 DIAGNOSIS — C4490 Unspecified malignant neoplasm of skin, unspecified: Secondary | ICD-10-CM

## 2014-04-04 DIAGNOSIS — E785 Hyperlipidemia, unspecified: Secondary | ICD-10-CM

## 2014-04-04 DIAGNOSIS — R51 Headache: Secondary | ICD-10-CM

## 2014-04-04 DIAGNOSIS — E782 Mixed hyperlipidemia: Secondary | ICD-10-CM

## 2014-04-04 DIAGNOSIS — M25559 Pain in unspecified hip: Secondary | ICD-10-CM

## 2014-04-04 DIAGNOSIS — F419 Anxiety disorder, unspecified: Secondary | ICD-10-CM

## 2014-04-04 DIAGNOSIS — D72829 Elevated white blood cell count, unspecified: Secondary | ICD-10-CM

## 2014-04-04 DIAGNOSIS — R519 Headache, unspecified: Secondary | ICD-10-CM

## 2014-04-04 DIAGNOSIS — M25569 Pain in unspecified knee: Secondary | ICD-10-CM

## 2014-04-04 DIAGNOSIS — R202 Paresthesia of skin: Secondary | ICD-10-CM

## 2014-04-04 DIAGNOSIS — F418 Other specified anxiety disorders: Secondary | ICD-10-CM

## 2014-04-04 LAB — RHEUMATOID FACTOR: Rhuematoid fact SerPl-aCnc: <10 IU/mL (ref <=14)

## 2014-04-04 NOTE — Progress Notes (Signed)
Pre visit review using our clinic review tool, if applicable. No additional management support is needed unless otherwise documented below in the visit note. 

## 2014-04-04 NOTE — Progress Notes (Signed)
Aaron Dyer  [id number] 1965-04-03 04/04/2014      Progress Note-Follow Up  Subjective  Chief Complaint  Chief Complaint  Patient presents with  . MEDICATION CHECK    AND REFILLS  . Headache    x 2-3 weeks    HPI  Patient is a 49 y.o. male in today for routine medical care. He is in today with his wife to discuss ongoing health concerns. Continues to struggle with recurrent skin cancers and is followed closely with dermatology. Today's complaining of worsening myalgias and arthralgias as well as airspaces in his legs and hands. He reports episodes of having swelling in his fingers and elbows. Is complaining of his feet feeling cold increased fatigue and weakness. No fevers or chills. No cough or congestion. No urinary complaints. Denies CP/palp/SOB/HA/congestion/fevers/GI or GU c/o. Taking meds as prescribed  Past Medical History  Diagnosis Date  . Chicken pox as a child  . Mumps as a child  . Anxiety and depression   . Hyperlipidemia   . Rectal bleeding 04/29/2011  . Urinary hesitancy 04/30/2011  . Hematuria 04/30/2011  . IBS (irritable bowel syndrome)   . Depression   . GERD (gastroesophageal reflux disease)   . Colon polyps 05/28/2011  . Insomnia 05/28/2011  . Cancer     face, arms, legs, and ear, SCC  . Hemorrhoids 04/29/2011  . Lipoma 04/21/2012    On back near left shoulder blade and above right hip  . Sun-damaged skin 04/21/2012  . Laceration of skin of scalp 09/05/2012    right  . Skin cancer     face, arms, legs, and ear skin cancer Followed with surgeon who has retired per patient, has had 2 Mohs procedures on his right ear and 2 lesions excised, one form each leg.   Marland Kitchen Urinary frequency 04/30/2011  . Back pain 03/15/2013  . ED (erectile dysfunction) 08/22/2013    Past Surgical History  Procedure Laterality Date  . Remove squamous cell      b/l legs  . Mohs surgery  2010    right ear, twice    Family History  Problem Relation Age of Onset  . COPD Mother   .  Emphysema Mother   . Stroke Mother     X 2  . Heart disease Mother     CABG in her 10s  . Hyperlipidemia Mother   . Hypertension Mother   . Diabetes Mother     type 2  . Cancer Father     melanoma  . Stroke Father     61  . Hypertension Father   . Alcohol abuse Father   . Diabetes Maternal Grandmother     type 2  . Heart disease Maternal Grandmother     CHF  . Heart attack Maternal Grandfather   . Heart disease Paternal Grandmother   . Heart disease Paternal Grandfather     History   Social History  . Marital Status: Married    Spouse Name: N/A    Number of Children: 2  . Years of Education: N/A   Occupational History  . Landscape    Social History Main Topics  . Smoking status: Never Smoker   . Smokeless tobacco: Never Used  . Alcohol Use: Yes     Comment: occasionally  . Drug Use: No  . Sexual Activity: Yes   Other Topics Concern  . Not on file   Social History Narrative    Current Outpatient Prescriptions on File Prior  to Visit  Medication Sig Dispense Refill  . aspirin 81 MG tablet Take 81 mg by mouth daily.    Marland Kitchen buPROPion (WELLBUTRIN XL) 300 MG 24 hr tablet Take 1 tablet (300 mg total) by mouth daily. 90 tablet 1  . fish oil-omega-3 fatty acids 1000 MG capsule Take 1 g by mouth daily.    Marland Kitchen FLUoxetine (PROZAC) 20 MG capsule Take 1 capsule (20 mg total) by mouth daily. 90 capsule 3  . omeprazole (PRILOSEC) 40 MG capsule Take 1 capsule (40 mg total) by mouth daily. 90 capsule 3  . ranitidine (ZANTAC) 300 MG tablet Take 1 tablet (300 mg total) by mouth at bedtime. 90 tablet 3  . temazepam (RESTORIL) 15 MG capsule Take 1 capsule (15 mg total) by mouth at bedtime as needed for sleep. 30 capsule 3  . nitroGLYCERIN (NITROSTAT) 0.4 MG SL tablet Place 1 tablet (0.4 mg total) under the tongue every 5 (five) minutes as needed for chest pain. After 3 doses cp still present proceed to ER 25 tablet 3  . simvastatin (ZOCOR) 20 MG tablet Take 1 tablet (20 mg total) by  mouth at bedtime. (Patient not taking: Reported on 04/04/2014) 90 tablet 3   No current facility-administered medications on file prior to visit.    Allergies  Allergen Reactions  . Crestor [Rosuvastatin]     myalgias    Review of Systems  Review of Systems  Constitutional: Positive for malaise/fatigue. Negative for fever.  HENT: Negative for congestion.   Eyes: Negative for discharge.  Respiratory: Negative for shortness of breath.   Cardiovascular: Negative for chest pain, palpitations and leg swelling.  Gastrointestinal: Negative for nausea, abdominal pain and diarrhea.  Genitourinary: Negative for dysuria.  Musculoskeletal: Negative for falls.  Skin: Negative for rash.  Neurological: Positive for tingling and sensory change. Negative for loss of consciousness and headaches.  Endo/Heme/Allergies: Negative for polydipsia.  Psychiatric/Behavioral: Negative for depression and suicidal ideas. The patient is not nervous/anxious and does not have insomnia.     Objective  BP 121/82 mmHg  Pulse 71  Temp(Src) 98.2 F (36.8 C) (Oral)  Ht 5\' 9"  (1.753 m)  Wt 165 lb 3.2 oz (74.934 kg)  BMI 24.38 kg/m2  SpO2 98%  Physical Exam  Physical Exam  Constitutional: He is oriented to person, place, and time and well-developed, well-nourished, and in no distress. No distress.  HENT:  Head: Normocephalic and atraumatic.  Eyes: Conjunctivae are normal.  Neck: Neck supple. No thyromegaly present.  Cardiovascular: Normal rate, regular rhythm and normal heart sounds.   No murmur heard. Pulmonary/Chest: Effort normal and breath sounds normal. No respiratory distress.  Abdominal: He exhibits no distension and no mass. There is no tenderness.  Musculoskeletal: He exhibits no edema.  Neurological: He is alert and oriented to person, place, and time.  Skin: Skin is warm.  Psychiatric: Memory, affect and judgment normal.    Lab Results  Component Value Date   TSH 3.05 11/25/2013   Lab  Results  Component Value Date   WBC 9.8 11/25/2013   HGB 14.4 11/25/2013   HCT 42.8 11/25/2013   MCV 95.0 11/25/2013   PLT 227.0 11/25/2013   Lab Results  Component Value Date   CREATININE 1.0 11/25/2013   BUN 10 11/25/2013   NA 136 11/25/2013   K 4.0 11/25/2013   CL 100 11/25/2013   CO2 30 11/25/2013   Lab Results  Component Value Date   ALT 22 11/25/2013   AST 19 11/25/2013  ALKPHOS 80 11/25/2013   BILITOT 0.7 11/25/2013   Lab Results  Component Value Date   CHOL 283* 11/25/2013   Lab Results  Component Value Date   HDL 30.20* 11/25/2013   Lab Results  Component Value Date   LDLCALC NOT CALC 08/19/2013   Lab Results  Component Value Date   TRIG 381.0* 11/25/2013   Lab Results  Component Value Date   CHOLHDL 9 11/25/2013     Assessment & Plan  Skin cancer Recurrent following very closely wih dermatology   Hyperlipidemia Encouraged heart healthy diet, increase exercise, avoid trans fats, consider a krill oil cap daily. Has struggled with statins, will try Simvastatin.   Reflux Avoid offending foods, start probiotics. Do not eat large meals in late evening and consider raising head of bed.    Elevated WBCs No obvious cause, will repeat CBC next week if still elevated will investigate more.   Anxiety and depression Improved with Prozac

## 2014-04-04 NOTE — Patient Instructions (Addendum)
Drink 64 oz of clear fluids daily and small, frequent meals with lean proteins and complex carbs  Try Curcumen caps daily Start a probiotic daily Vitamin B complex daily   Cholesterol Cholesterol is a white, waxy, fat-like substance needed by your body in small amounts. The liver makes all the cholesterol you need. Cholesterol is carried from the liver by the blood through the blood vessels. Deposits of cholesterol (plaque) may build up on blood vessel walls. These make the arteries narrower and stiffer. Cholesterol plaques increase the risk for heart attack and stroke.  You cannot feel your cholesterol level even if it is very high. The only way to know it is high is with a blood test. Once you know your cholesterol levels, you should keep a record of the test results. Work with your health care provider to keep your levels in the desired range.  WHAT DO THE RESULTS MEAN?  Total cholesterol is a rough measure of all the cholesterol in your blood.   LDL is the so-called bad cholesterol. This is the type that deposits cholesterol in the walls of the arteries. You want this level to be low.   HDL is the good cholesterol because it cleans the arteries and carries the LDL away. You want this level to be high.  Triglycerides are fat that the body can either burn for energy or store. High levels are closely linked to heart disease.  WHAT ARE THE DESIRED LEVELS OF CHOLESTEROL?  Total cholesterol below 200.   LDL below 100 for people at risk, below 70 for those at very high risk.   HDL above 50 is good, above 60 is best.   Triglycerides below 150.  HOW CAN I LOWER MY CHOLESTEROL?  Diet. Follow your diet programs as directed by your health care provider.   Choose fish or white meat chicken and [place], roasted or baked. Limit fatty cuts of red meat, fried foods, and processed meats, such as sausage and lunch meats.   Eat lots of fresh fruits and vegetables.  Choose whole grains,  beans, pasta, potatoes, and cereals.   Use only small amounts of olive, corn, or canola oils.   Avoid butter, mayonnaise, shortening, or palm kernel oils.  Avoid foods with trans fats.   Drink skim or nonfat milk and eat low-fat or nonfat yogurt and cheeses. Avoid whole milk, cream, ice cream, egg yolks, and full-fat cheeses.   Healthy desserts include angel food cake, ginger snaps, animal crackers, hard candy, popsicles, and low-fat or nonfat frozen yogurt. Avoid pastries, cakes, pies, and cookies.   Exercise. Follow your exercise programs as directed by your health care provider.   A regular program helps decrease LDL and raise HDL.   A regular program helps with weight control.   Do things that increase your activity level like gardening, walking, or taking the stairs. Ask your health care provider about how you can be more active in your daily life.   Medicine. Take medicine only as directed by your health care provider.   Medicine may be prescribed by your health care provider to help lower cholesterol and decrease the risk for heart disease.   If you have several risk factors, you may need medicine even if your levels are normal. Document Released: 12/04/2000 Document Revised: 07/26/2013 Document Reviewed: 12/23/2012 [facility] Patient Information 2015 [place], [place]. This information is not intended to replace advice given to you by your health care provider. Make sure you discuss any questions you have with  your health care provider. r fluids daily, small, frequent meals with lean proteins and complex carbs.

## 2014-04-05 LAB — LDL CHOLESTEROL, DIRECT: LDL DIRECT: 130.2 mg/dL

## 2014-04-05 LAB — SEDIMENTATION RATE: Sed Rate: 9 mm/hr (ref 0–22)

## 2014-04-05 LAB — LIPID PANEL
CHOL/HDL RATIO: 8
CHOLESTEROL: 229 mg/dL — AB (ref 0–200)
HDL: 28.1 mg/dL — ABNORMAL LOW (ref >39.00)
NonHDL: 200.9
Triglycerides: 292 mg/dL — ABNORMAL HIGH (ref 0.0–149.0)
VLDL: 58.4 mg/dL — ABNORMAL HIGH (ref 0.0–40.0)

## 2014-04-05 LAB — C-REACTIVE PROTEIN: CRP: 0.5 mg/dL (ref 0.5–20.0)

## 2014-04-05 LAB — COMPREHENSIVE METABOLIC PANEL
ALBUMIN: 4.7 g/dL (ref 3.5–5.2)
ALT: 17 U/L (ref 0–53)
AST: 17 U/L (ref 0–37)
Alkaline Phosphatase: 79 U/L (ref 39–117)
BUN: 14 mg/dL (ref 6–23)
CALCIUM: 9.5 mg/dL (ref 8.4–10.5)
CO2: 28 meq/L (ref 19–32)
Chloride: 101 mEq/L (ref 96–112)
Creatinine, Ser: 1 mg/dL (ref 0.4–1.5)
GFR: 83.38 mL/min (ref >60.00)
Glucose, Bld: 84 mg/dL (ref 70–99)
POTASSIUM: 4.2 meq/L (ref 3.5–5.1)
Sodium: 135 mEq/L (ref 135–145)
TOTAL PROTEIN: 7.6 g/dL (ref 6.0–8.3)
Total Bilirubin: 0.5 mg/dL (ref 0.2–1.2)

## 2014-04-05 LAB — CBC
HCT: 45.8 % (ref 39.0–52.0)
Hemoglobin: 15.1 g/dL (ref 13.0–17.0)
MCHC: 33 g/dL (ref 30.0–36.0)
MCV: 95.1 fl (ref 78.0–100.0)
Platelets: 245 10*3/uL (ref 150.0–400.0)
RBC: 4.82 Mil/uL (ref 4.22–5.81)
RDW: 13.9 % (ref 11.5–15.5)
WBC: 16.4 10*3/uL — ABNORMAL HIGH (ref 4.0–10.5)

## 2014-04-05 LAB — ANA: Anti Nuclear Antibody(ANA): NEGATIVE

## 2014-04-05 LAB — TSH: TSH: 5.29 u[IU]/mL — AB (ref 0.35–4.50)

## 2014-04-06 ENCOUNTER — Telehealth: Payer: Self-pay

## 2014-04-06 ENCOUNTER — Other Ambulatory Visit (INDEPENDENT_AMBULATORY_CARE_PROVIDER_SITE_OTHER): Payer: BLUE CROSS/BLUE SHIELD

## 2014-04-06 DIAGNOSIS — E7800 Pure hypercholesterolemia, unspecified: Secondary | ICD-10-CM

## 2014-04-06 DIAGNOSIS — R7989 Other specified abnormal findings of blood chemistry: Secondary | ICD-10-CM

## 2014-04-06 DIAGNOSIS — R790 Abnormal level of blood mineral: Secondary | ICD-10-CM

## 2014-04-06 DIAGNOSIS — G4700 Insomnia, unspecified: Secondary | ICD-10-CM

## 2014-04-06 DIAGNOSIS — F329 Major depressive disorder, single episode, unspecified: Secondary | ICD-10-CM

## 2014-04-06 DIAGNOSIS — F418 Other specified anxiety disorders: Secondary | ICD-10-CM

## 2014-04-06 DIAGNOSIS — F32A Depression, unspecified: Secondary | ICD-10-CM

## 2014-04-06 DIAGNOSIS — K219 Gastro-esophageal reflux disease without esophagitis: Secondary | ICD-10-CM

## 2014-04-06 LAB — T4, FREE: Free T4: 0.78 ng/dL (ref 0.60–1.60)

## 2014-04-06 MED ORDER — OMEPRAZOLE 40 MG PO CPDR: 40.0000 mg | DELAYED_RELEASE_CAPSULE | Freq: Every day | ORAL | Status: DC

## 2014-04-06 MED ORDER — SIMVASTATIN 20 MG PO TABS: 20.0000 mg | ORAL_TABLET | Freq: Every day | ORAL | Status: DC

## 2014-04-06 MED ORDER — FLUOXETINE HCL 20 MG PO CAPS: 20.0000 mg | ORAL_CAPSULE | Freq: Every day | ORAL | Status: DC

## 2014-04-06 MED ORDER — RANITIDINE HCL 300 MG PO TABS: 300.0000 mg | ORAL_TABLET | Freq: Every day | ORAL | Status: DC

## 2014-04-06 MED ORDER — BUPROPION HCL ER (XL) 300 MG PO TB24: 300.0000 mg | ORAL_TABLET | Freq: Every day | ORAL | Status: DC

## 2014-04-06 NOTE — Telephone Encounter (Signed)
Pt is following up on his lab results and also states that dr. Charlett Blake did not call in any of his rx when he was here on Monday. Please call [phone].

## 2014-04-06 NOTE — Telephone Encounter (Signed)
Sent lab add on. Reviewed labs per recommendations.  Med's sent to [facility]

## 2014-04-06 NOTE — Telephone Encounter (Signed)
-----   Message from Mosie Lukes, MD sent at 04/05/2014 11:47 PM EST ----- Please have lab order free T4 for abnormal TSH. Notify cholesterol better but still off. I think her agreed to restart his Simvastatin since his myalgias did not improve with stopping. Can start at 1/2 of his 20 mg tab daily if he is willing

## 2014-04-10 ENCOUNTER — Encounter: Payer: Self-pay | Admitting: Family Medicine

## 2014-04-10 DIAGNOSIS — D72829 Elevated white blood cell count, unspecified: Secondary | ICD-10-CM | POA: Insufficient documentation

## 2014-04-10 NOTE — Assessment & Plan Note (Signed)
No obvious cause, will repeat CBC next week if still elevated will investigate more.

## 2014-04-10 NOTE — Assessment & Plan Note (Signed)
Improved with Prozac

## 2014-04-10 NOTE — Assessment & Plan Note (Addendum)
Encouraged heart healthy diet, increase exercise, avoid trans fats, consider a krill oil cap daily. Has struggled with statins, will try Simvastatin.

## 2014-04-10 NOTE — Assessment & Plan Note (Signed)
Recurrent following very closely wih dermatology

## 2014-04-10 NOTE — Assessment & Plan Note (Signed)
Avoid offending foods, start probiotics. Do not eat large meals in late evening and consider raising head of bed.  

## 2014-04-19 ENCOUNTER — Telehealth: Payer: Self-pay

## 2014-04-19 DIAGNOSIS — D72829 Elevated white blood cell count, unspecified: Secondary | ICD-10-CM

## 2014-04-19 NOTE — Telephone Encounter (Signed)
Patient contacted. Scheduled. Order entered.

## 2014-04-19 NOTE — Telephone Encounter (Signed)
-----   Message from Mosie Lukes, MD sent at 04/10/2014  5:50 PM EST ----- Reviewing his labs, WBC elevated no obvious cause nothing else positive. See if he is willing to repeat cbc this week

## 2014-04-19 NOTE — Telephone Encounter (Signed)
error 

## 2014-04-20 ENCOUNTER — Other Ambulatory Visit (INDEPENDENT_AMBULATORY_CARE_PROVIDER_SITE_OTHER): Payer: BLUE CROSS/BLUE SHIELD

## 2014-04-20 DIAGNOSIS — D72829 Elevated white blood cell count, unspecified: Secondary | ICD-10-CM

## 2014-04-20 LAB — CBC WITH DIFFERENTIAL/PLATELET
BASOS ABS: 0 10*3/uL (ref 0.0–0.1)
Basophils Relative: 0.2 % (ref 0.0–3.0)
Eosinophils Absolute: 0.1 10*3/uL (ref 0.0–0.7)
Eosinophils Relative: 0.7 % (ref 0.0–5.0)
HCT: 46.2 % (ref 39.0–52.0)
HEMOGLOBIN: 15.7 g/dL (ref 13.0–17.0)
LYMPHS ABS: 3.4 10*3/uL (ref 0.7–4.0)
Lymphocytes Relative: 28.6 % (ref 12.0–46.0)
MCHC: 34.1 g/dL (ref 30.0–36.0)
MCV: 93.1 fl (ref 78.0–100.0)
Monocytes Absolute: 0.5 10*3/uL (ref 0.1–1.0)
Monocytes Relative: 3.9 % (ref 3.0–12.0)
NEUTROS ABS: 7.9 10*3/uL — AB (ref 1.4–7.7)
Neutrophils Relative %: 66.6 % (ref 43.0–77.0)
PLATELETS: 269 10*3/uL (ref 150.0–400.0)
RBC: 4.96 Mil/uL (ref 4.22–5.81)
RDW: 13.9 % (ref 11.5–15.5)
WBC: 11.8 10*3/uL — ABNORMAL HIGH (ref 4.0–10.5)

## 2014-04-26 ENCOUNTER — Encounter: Payer: Self-pay | Admitting: Internal Medicine

## 2014-05-02 ENCOUNTER — Telehealth: Payer: Self-pay | Admitting: *Deleted

## 2014-05-02 NOTE — Telephone Encounter (Signed)
LMOM @ 1:24pm @ [phone]) asking the pt to RTC regarding UDS results.//AB/CMA

## 2014-05-02 NOTE — Telephone Encounter (Signed)
Called and spoke with the pt to informed him of UDS results.  Per Dr. Charlett Blake he will have to retest due to the [facility] been positive.  If still positive then no more Temazepam or controlled meds.  Pt verbalized understanding.//AB/CMA

## 2014-05-09 ENCOUNTER — Telehealth: Payer: Self-pay | Admitting: Family Medicine

## 2014-05-09 NOTE — Telephone Encounter (Signed)
We were rechecking his WBC and it was greatly improved. Nearly back to normal. No further lab work til next appt at this time.

## 2014-05-09 NOTE — Telephone Encounter (Signed)
The wife called today and is requesting results from lab done on 04/20/14 repeat CBC.  The wife would like a call back tomorrow 05/10/14.

## 2014-05-10 NOTE — Telephone Encounter (Signed)
Called left msg. To call back 

## 2014-05-10 NOTE — Telephone Encounter (Signed)
Called left message to call back 

## 2014-05-10 NOTE — Telephone Encounter (Signed)
Patients wife informed of results and ok for cream.

## 2014-05-10 NOTE — Telephone Encounter (Signed)
The T4 was normal. Will just recheck with next visit, I assume the cream is Fluorouracil? It is fine.

## 2014-05-10 NOTE — Telephone Encounter (Signed)
Patients wife informed of WBC results.  She also stated they were called back in January to go back to the lab to recheck the T4 and never heard back with those results.  Also another question the patient is starting to take flora cream for skin cancer and is it ok.

## 2014-07-04 ENCOUNTER — Ambulatory Visit: Payer: BLUE CROSS/BLUE SHIELD | Admitting: Family Medicine

## 2014-07-13 ENCOUNTER — Telehealth: Payer: Self-pay | Admitting: Family Medicine

## 2014-07-13 NOTE — Telephone Encounter (Signed)
If he has had a previous no show in last year charge, if not then do not charge. Thxma

## 2014-07-13 NOTE — Telephone Encounter (Signed)
Pt was no show for follow up on 4/11- has since rescheduled appt. Charge?

## 2014-08-12 ENCOUNTER — Ambulatory Visit: Payer: BLUE CROSS/BLUE SHIELD | Admitting: Family Medicine

## 2014-08-23 ENCOUNTER — Ambulatory Visit: Payer: BLUE CROSS/BLUE SHIELD | Admitting: Family Medicine

## 2014-08-23 DIAGNOSIS — Z0289 Encounter for other administrative examinations: Secondary | ICD-10-CM

## 2014-08-30 ENCOUNTER — Telehealth: Payer: Self-pay | Admitting: Family Medicine

## 2014-08-30 ENCOUNTER — Encounter: Payer: Self-pay | Admitting: Family Medicine

## 2014-08-30 NOTE — Telephone Encounter (Signed)
Pt was no show for appt on 5/31- 2nd no show since April 2016. Letter sent. Charge?

## 2014-08-30 NOTE — Telephone Encounter (Signed)
Yes please, let me know if he has number 3

## 2014-09-02 ENCOUNTER — Encounter: Payer: Self-pay | Admitting: Internal Medicine

## 2014-09-02 ENCOUNTER — Other Ambulatory Visit: Payer: Self-pay | Admitting: Family Medicine

## 2014-09-02 ENCOUNTER — Ambulatory Visit (INDEPENDENT_AMBULATORY_CARE_PROVIDER_SITE_OTHER): Payer: BLUE CROSS/BLUE SHIELD | Admitting: Family Medicine

## 2014-09-02 ENCOUNTER — Encounter: Payer: Self-pay | Admitting: Family Medicine

## 2014-09-02 ENCOUNTER — Telehealth: Payer: Self-pay | Admitting: Family Medicine

## 2014-09-02 VITALS — BP 133/92 | HR 69 | Temp 98.1°F | Ht 68.0 in | Wt 163.5 lb

## 2014-09-02 DIAGNOSIS — C4490 Unspecified malignant neoplasm of skin, unspecified: Secondary | ICD-10-CM

## 2014-09-02 DIAGNOSIS — IMO0001 Reserved for inherently not codable concepts without codable children: Secondary | ICD-10-CM

## 2014-09-02 DIAGNOSIS — G4700 Insomnia, unspecified: Secondary | ICD-10-CM

## 2014-09-02 DIAGNOSIS — R946 Abnormal results of thyroid function studies: Secondary | ICD-10-CM | POA: Diagnosis not present

## 2014-09-02 DIAGNOSIS — Z860100 Personal history of colon polyps, unspecified: Secondary | ICD-10-CM

## 2014-09-02 DIAGNOSIS — E7800 Pure hypercholesterolemia, unspecified: Secondary | ICD-10-CM

## 2014-09-02 DIAGNOSIS — F418 Other specified anxiety disorders: Secondary | ICD-10-CM

## 2014-09-02 DIAGNOSIS — M542 Cervicalgia: Secondary | ICD-10-CM

## 2014-09-02 DIAGNOSIS — D72829 Elevated white blood cell count, unspecified: Secondary | ICD-10-CM

## 2014-09-02 DIAGNOSIS — K635 Polyp of colon: Secondary | ICD-10-CM

## 2014-09-02 DIAGNOSIS — F329 Major depressive disorder, single episode, unspecified: Secondary | ICD-10-CM

## 2014-09-02 DIAGNOSIS — K625 Hemorrhage of anus and rectum: Secondary | ICD-10-CM

## 2014-09-02 DIAGNOSIS — R7989 Other specified abnormal findings of blood chemistry: Secondary | ICD-10-CM

## 2014-09-02 DIAGNOSIS — F32A Depression, unspecified: Secondary | ICD-10-CM

## 2014-09-02 DIAGNOSIS — M5440 Lumbago with sciatica, unspecified side: Secondary | ICD-10-CM

## 2014-09-02 DIAGNOSIS — E785 Hyperlipidemia, unspecified: Secondary | ICD-10-CM

## 2014-09-02 DIAGNOSIS — K219 Gastro-esophageal reflux disease without esophagitis: Secondary | ICD-10-CM | POA: Diagnosis not present

## 2014-09-02 DIAGNOSIS — F419 Anxiety disorder, unspecified: Secondary | ICD-10-CM

## 2014-09-02 LAB — CBC
HCT: 43.5 % (ref 39.0–52.0)
HEMOGLOBIN: 14.7 g/dL (ref 13.0–17.0)
MCHC: 33.8 g/dL (ref 30.0–36.0)
MCV: 94.7 fl (ref 78.0–100.0)
Platelets: 265 10*3/uL (ref 150.0–400.0)
RBC: 4.59 Mil/uL (ref 4.22–5.81)
RDW: 13.9 % (ref 11.5–15.5)
WBC: 10.6 10*3/uL — ABNORMAL HIGH (ref 4.0–10.5)

## 2014-09-02 LAB — COMPREHENSIVE METABOLIC PANEL
ALT: 18 U/L (ref 0–53)
AST: 17 U/L (ref 0–37)
Albumin: 4.6 g/dL (ref 3.5–5.2)
Alkaline Phosphatase: 84 U/L (ref 39–117)
BUN: 15 mg/dL (ref 6–23)
CHLORIDE: 104 meq/L (ref 96–112)
CO2: 29 meq/L (ref 19–32)
Calcium: 9.5 mg/dL (ref 8.4–10.5)
Creatinine, Ser: 0.92 mg/dL (ref 0.40–1.50)
GFR: 92.7 mL/min (ref >60.00)
Glucose, Bld: 97 mg/dL (ref 70–99)
POTASSIUM: 3.9 meq/L (ref 3.5–5.1)
Sodium: 138 mEq/L (ref 135–145)
Total Bilirubin: 0.5 mg/dL (ref 0.2–1.2)
Total Protein: 7.3 g/dL (ref 6.0–8.3)

## 2014-09-02 LAB — LIPID PANEL
CHOLESTEROL: 210 mg/dL — AB (ref 0–200)
HDL: 36.2 mg/dL — ABNORMAL LOW (ref >39.00)
LDL Cholesterol: 152 mg/dL — ABNORMAL HIGH (ref 0–99)
NonHDL: 173.8
Total CHOL/HDL Ratio: 6
Triglycerides: 110 mg/dL (ref 0.0–149.0)
VLDL: 22 mg/dL (ref 0.0–40.0)

## 2014-09-02 LAB — T4, FREE: Free T4: 0.65 ng/dL (ref 0.60–1.60)

## 2014-09-02 LAB — TSH: TSH: 1.57 u[IU]/mL (ref 0.35–4.50)

## 2014-09-02 MED ORDER — FLUOXETINE HCL 20 MG PO CAPS: 20.0000 mg | ORAL_CAPSULE | Freq: Every day | ORAL | Status: DC

## 2014-09-02 MED ORDER — SIMVASTATIN 20 MG PO TABS: 20.0000 mg | ORAL_TABLET | Freq: Every day | ORAL | Status: DC

## 2014-09-02 MED ORDER — FLUTICASONE PROPIONATE 50 MCG/ACT NA SUSP: 2.0000 | Freq: Every morning | NASAL | Status: DC

## 2014-09-02 MED ORDER — BUPROPION HCL ER (XL) 300 MG PO TB24: 300.0000 mg | ORAL_TABLET | Freq: Every day | ORAL | Status: DC

## 2014-09-02 MED ORDER — SIMVASTATIN 40 MG PO TABS: 40.0000 mg | ORAL_TABLET | Freq: Every day | ORAL | Status: DC

## 2014-09-02 MED ORDER — RANITIDINE HCL 300 MG PO TABS: 300.0000 mg | ORAL_TABLET | Freq: Every day | ORAL | Status: DC

## 2014-09-02 MED ORDER — TEMAZEPAM 15 MG PO CAPS
15.0000 mg | ORAL_CAPSULE | Freq: Every evening | ORAL | Status: DC | PRN
Start: 2014-09-02 — End: 2014-09-02

## 2014-09-02 NOTE — Telephone Encounter (Signed)
Relation to pt: self  Call back number:[phone]   Reason for call:  Pt returning your call regarding lab results

## 2014-09-02 NOTE — Progress Notes (Signed)
Pre visit review using our clinic review tool, if applicable. No additional management support is needed unless otherwise documented below in the visit note. 

## 2014-09-02 NOTE — Telephone Encounter (Signed)
Patient informed of results.  

## 2014-09-02 NOTE — Patient Instructions (Signed)

## 2014-09-05 ENCOUNTER — Encounter: Payer: Self-pay | Admitting: Family Medicine

## 2014-09-11 ENCOUNTER — Encounter: Payer: Self-pay | Admitting: Family Medicine

## 2014-09-11 NOTE — Progress Notes (Signed)
Aaron Dyer  [id number] 11/09/1965 09/11/2014      Progress Note-Follow Up  Subjective  Chief Complaint  Chief Complaint  Patient presents with  . Follow-up    HPI  Patient is a 49 y.o. male in today for routine medical care. Patient is in today for follow-up on numerous conditions. He notes some persistent neck and back pain with occasional radicular tingling and numbness in all 4 extremities at times changes positionally. Has been having skin cancer treated by dermatology with fluorouracil. Has been struggling with increased congestion with mild itching of his and some clear drainage at times. No ear or throat symptoms. No fevers or chills. Does note some occasional lymphadenopathy is neck as a result of his allergic symptoms. Denies CP/palp/SOB/HA/congestion/fevers/GI or GU c/o. Taking meds as prescribed  Past Medical History  Diagnosis Date  . Chicken pox as a child  . Mumps as a child  . Anxiety and depression   . Hyperlipidemia   . Rectal bleeding 04/29/2011  . Urinary hesitancy 04/30/2011  . Hematuria 04/30/2011  . IBS (irritable bowel syndrome)   . Depression   . GERD (gastroesophageal reflux disease)   . Colon polyps 05/28/2011  . Insomnia 05/28/2011  . Cancer     face, arms, legs, and ear, SCC  . Hemorrhoids 04/29/2011  . Lipoma 04/21/2012    On back near left shoulder blade and above right hip  . Sun-damaged skin 04/21/2012  . Laceration of skin of scalp 09/05/2012    right  . Skin cancer     face, arms, legs, and ear skin cancer Followed with surgeon who has retired per patient, has had 2 Mohs procedures on his right ear and 2 lesions excised, one form each leg.   Marland Kitchen Urinary frequency 04/30/2011  . Back pain 03/15/2013  . ED (erectile dysfunction) 08/22/2013  . Elevated WBCs 04/10/2014    Past Surgical History  Procedure Laterality Date  . Remove squamous cell      b/l legs  . Mohs surgery  2010    right ear, twice    Family History  Problem Relation Age of  Onset  . COPD Mother   . Emphysema Mother   . Stroke Mother     X 2  . Heart disease Mother     CABG in her 8s  . Hyperlipidemia Mother   . Hypertension Mother   . Diabetes Mother     type 2  . Cancer Father     melanoma  . Stroke Father     74  . Hypertension Father   . Alcohol abuse Father   . Diabetes Maternal Grandmother     type 2  . Heart disease Maternal Grandmother     CHF  . Heart attack Maternal Grandfather   . Heart disease Paternal Grandmother   . Heart disease Paternal Grandfather     History   Social History  . Marital Status: Married    Spouse Name: N/A  . Number of Children: 2  . Years of Education: N/A   Occupational History  . Landscape    Social History Main Topics  . Smoking status: Never Smoker   . Smokeless tobacco: Never Used  . Alcohol Use: Yes     Comment: occasionally  . Drug Use: No  . Sexual Activity: Yes   Other Topics Concern  . Not on file   Social History Narrative    Current Outpatient Prescriptions on File Prior to Visit  Medication  Sig Dispense Refill  . aspirin 81 MG tablet Take 81 mg by mouth daily.    . fish oil-omega-3 fatty acids 1000 MG capsule Take 1 g by mouth daily.    Marland Kitchen omeprazole (PRILOSEC) 40 MG capsule Take 1 capsule (40 mg total) by mouth daily. 90 capsule 3  . nitroGLYCERIN (NITROSTAT) 0.4 MG SL tablet Place 1 tablet (0.4 mg total) under the tongue every 5 (five) minutes as needed for chest pain. After 3 doses cp still present proceed to ER 25 tablet 3   No current facility-administered medications on file prior to visit.    Allergies  Allergen Reactions  . Crestor [Rosuvastatin]     myalgias    Review of Systems  Review of Systems  Constitutional: Positive for malaise/fatigue. Negative for fever and chills.  HENT: Negative for congestion, hearing loss and nosebleeds.   Eyes: Negative for discharge.  Respiratory: Negative for cough, sputum production, shortness of breath and wheezing.     Cardiovascular: Negative for chest pain, palpitations and leg swelling.  Gastrointestinal: Negative for heartburn, nausea, vomiting, abdominal pain, diarrhea, constipation and blood in stool.  Genitourinary: Negative for dysuria, urgency, frequency and hematuria.  Musculoskeletal: Positive for back pain and joint pain. Negative for myalgias and falls.  Skin: Negative for rash.  Neurological: Positive for sensory change. Negative for dizziness, tremors, focal weakness, loss of consciousness, weakness and headaches.  Endo/Heme/Allergies: Negative for polydipsia. Does not bruise/bleed easily.  Psychiatric/Behavioral: Negative for depression and suicidal ideas. The patient is not nervous/anxious and does not have insomnia.     Objective  BP 133/92 mmHg  Pulse 69  Temp(Src) 98.1 F (36.7 C) (Oral)  Ht 5\' 8"  (1.727 m)  Wt 163 lb 8 oz (74.163 kg)  BMI 24.87 kg/m2  SpO2 100%  Physical Exam  Physical Exam  Constitutional: He is oriented to person, place, and time and well-developed, well-nourished, and in no distress. No distress.  HENT:  Head: Normocephalic and atraumatic.  Eyes: Conjunctivae are normal.  Neck: Neck supple. No thyromegaly present.  Cardiovascular: Normal rate, regular rhythm and normal heart sounds.   No murmur heard. Pulmonary/Chest: Effort normal and breath sounds normal. No respiratory distress.  Abdominal: He exhibits no distension and no mass. There is no tenderness.  Musculoskeletal: He exhibits no edema.  Neurological: He is alert and oriented to person, place, and time.  Skin: Skin is warm.  Psychiatric: Memory, affect and judgment normal.    Lab Results  Component Value Date   TSH 1.57 09/02/2014   Lab Results  Component Value Date   WBC 10.6* 09/02/2014   HGB 14.7 09/02/2014   HCT 43.5 09/02/2014   MCV 94.7 09/02/2014   PLT 265.0 09/02/2014   Lab Results  Component Value Date   CREATININE 0.92 09/02/2014   BUN 15 09/02/2014   NA 138  09/02/2014   K 3.9 09/02/2014   CL 104 09/02/2014   CO2 29 09/02/2014   Lab Results  Component Value Date   ALT 18 09/02/2014   AST 17 09/02/2014   ALKPHOS 84 09/02/2014   BILITOT 0.5 09/02/2014   Lab Results  Component Value Date   CHOL 210* 09/02/2014   Lab Results  Component Value Date   HDL 36.20* 09/02/2014   Lab Results  Component Value Date   LDLCALC 152* 09/02/2014   Lab Results  Component Value Date   TRIG 110.0 09/02/2014   Lab Results  Component Value Date   CHOLHDL 6 09/02/2014  Assessment & Plan  Reflux Avoid offending foods, start probiotics. Do not eat large meals in late evening and consider raising head of bed.   Hyperlipidemia Tolerating statin, encouraged heart healthy diet, avoid trans fats, minimize simple carbs and saturated fats. Increase exercise as tolerated  Elevated WBCs Improving will continue to monitor  Anxiety and depression Improving with combination with combination of Fluoxetine and Bupropion  Insomnia Encouraged good sleep hygiene such as dark, quiet room. No blue/green glowing lights such as computer screens in bedroom. No alcohol or stimulants in evening. Cut down on caffeine as able. Regular exercise is helpful but not just prior to bed time. May use Temazepam prn.   Skin cancer Is being treated with Fluorurocil   Colon polyps Now with worsening constipation and intermittent rectal bleeding. Referred back to gastroenterology for there consideration, encouraged to add a fiber suppplement, 64 oz of clear fluids, and take probiotics  Back pain Patient sent for xray due to some radicular symptoms. Will consider ortho referral if worsens or any findings on xray  Neck pain, bilateral Patient sent for xray due to some radicular symptoms. Will consider ortho referral if worsens or any findings on xray

## 2014-09-11 NOTE — Assessment & Plan Note (Signed)
Avoid offending foods, start probiotics. Do not eat large meals in late evening and consider raising head of bed.  

## 2014-09-11 NOTE — Assessment & Plan Note (Signed)
Now with worsening constipation and intermittent rectal bleeding. Referred back to gastroenterology for there consideration, encouraged to add a fiber suppplement, 64 oz of clear fluids, and take probiotics

## 2014-09-11 NOTE — Assessment & Plan Note (Signed)
Is being treated with Fluorurocil

## 2014-09-11 NOTE — Assessment & Plan Note (Signed)
Patient sent for xray due to some radicular symptoms. Will consider ortho referral if worsens or any findings on xray

## 2014-09-11 NOTE — Assessment & Plan Note (Signed)
Encouraged good sleep hygiene such as dark, quiet room. No blue/green glowing lights such as computer screens in bedroom. No alcohol or stimulants in evening. Cut down on caffeine as able. Regular exercise is helpful but not just prior to bed time. May use Temazepam prn.

## 2014-09-11 NOTE — Assessment & Plan Note (Signed)
Improving with combination with combination of Fluoxetine and Bupropion

## 2014-09-11 NOTE — Assessment & Plan Note (Signed)
Tolerating statin, encouraged heart healthy diet, avoid trans fats, minimize simple carbs and saturated fats. Increase exercise as tolerated 

## 2014-09-11 NOTE — Assessment & Plan Note (Signed)
Improving will continue to monitor 

## 2014-09-22 ENCOUNTER — Other Ambulatory Visit: Payer: Self-pay | Admitting: Family Medicine

## 2014-10-13 ENCOUNTER — Other Ambulatory Visit: Payer: Self-pay | Admitting: Family Medicine

## 2014-10-13 ENCOUNTER — Encounter: Payer: Self-pay | Admitting: Family Medicine

## 2014-10-13 ENCOUNTER — Ambulatory Visit (INDEPENDENT_AMBULATORY_CARE_PROVIDER_SITE_OTHER): Payer: BLUE CROSS/BLUE SHIELD | Admitting: Family Medicine

## 2014-10-13 ENCOUNTER — Ambulatory Visit (HOSPITAL_BASED_OUTPATIENT_CLINIC_OR_DEPARTMENT_OTHER)
Admission: RE | Admit: 2014-10-13 | Discharge: 2014-10-13 | Disposition: A | Discharge status: Home / Self Care | Payer: BLUE CROSS/BLUE SHIELD | Source: Ambulatory Visit | Attending: Family Medicine | Admitting: Family Medicine

## 2014-10-13 VITALS — BP 118/78 | HR 81 | Temp 98.2°F | Resp 18 | Ht 68.0 in | Wt 163.0 lb

## 2014-10-13 DIAGNOSIS — R591 Generalized enlarged lymph nodes: Secondary | ICD-10-CM

## 2014-10-13 DIAGNOSIS — M5440 Lumbago with sciatica, unspecified side: Secondary | ICD-10-CM

## 2014-10-13 DIAGNOSIS — F418 Other specified anxiety disorders: Secondary | ICD-10-CM

## 2014-10-13 DIAGNOSIS — L049 Acute lymphadenitis, unspecified: Secondary | ICD-10-CM | POA: Diagnosis not present

## 2014-10-13 DIAGNOSIS — L043 Acute lymphadenitis of lower limb: Secondary | ICD-10-CM

## 2014-10-13 DIAGNOSIS — F329 Major depressive disorder, single episode, unspecified: Secondary | ICD-10-CM

## 2014-10-13 DIAGNOSIS — K219 Gastro-esophageal reflux disease without esophagitis: Secondary | ICD-10-CM

## 2014-10-13 DIAGNOSIS — R509 Fever, unspecified: Secondary | ICD-10-CM

## 2014-10-13 DIAGNOSIS — F32A Depression, unspecified: Secondary | ICD-10-CM

## 2014-10-13 DIAGNOSIS — K635 Polyp of colon: Secondary | ICD-10-CM

## 2014-10-13 DIAGNOSIS — F419 Anxiety disorder, unspecified: Secondary | ICD-10-CM

## 2014-10-13 DIAGNOSIS — M542 Cervicalgia: Secondary | ICD-10-CM

## 2014-10-13 DIAGNOSIS — R1909 Other intra-abdominal and pelvic swelling, mass and lump: Secondary | ICD-10-CM | POA: Diagnosis present

## 2014-10-13 DIAGNOSIS — C4490 Unspecified malignant neoplasm of skin, unspecified: Secondary | ICD-10-CM

## 2014-10-13 DIAGNOSIS — IMO0001 Reserved for inherently not codable concepts without codable children: Secondary | ICD-10-CM

## 2014-10-13 MED ORDER — AMOXICILLIN-POT CLAVULANATE 875-125 MG PO TABS: 1.0000 | ORAL_TABLET | Freq: Two times a day (BID) | ORAL | Status: DC

## 2014-10-13 NOTE — Progress Notes (Signed)
Pre visit review using our clinic review tool, if applicable. No additional management support is needed unless otherwise documented below in the visit note. 

## 2014-10-13 NOTE — Patient Instructions (Signed)

## 2014-10-14 LAB — CBC
HCT: 44 % (ref 39.0–52.0)
Hemoglobin: 15 g/dL (ref 13.0–17.0)
MCHC: 34 g/dL (ref 30.0–36.0)
MCV: 93.6 fl (ref 78.0–100.0)
Platelets: 281 10*3/uL (ref 150.0–400.0)
RBC: 4.7 Mil/uL (ref 4.22–5.81)
RDW: 13.3 % (ref 11.5–15.5)
WBC: 11.7 10*3/uL — AB (ref 4.0–10.5)

## 2014-10-17 ENCOUNTER — Telehealth: Payer: Self-pay | Admitting: Family Medicine

## 2014-10-17 NOTE — Telephone Encounter (Signed)
Returned patient call at Call back [place] and left voicemail to return call.

## 2014-10-17 NOTE — Telephone Encounter (Signed)
Relation to pt: self  Call back number:[phone]  Reason for call:  Patient calling in regards to x ray results

## 2014-10-18 NOTE — Telephone Encounter (Signed)
The wife has been informed of results.  She will inform the patient of results and he will call back if he decides to order an MRI.

## 2014-10-27 ENCOUNTER — Ambulatory Visit (AMBULATORY_SURGERY_CENTER): Payer: Self-pay

## 2014-10-27 VITALS — Ht 70.5 in | Wt 166.8 lb

## 2014-10-27 DIAGNOSIS — Z860100 Personal history of colon polyps, unspecified: Secondary | ICD-10-CM

## 2014-10-27 MED ORDER — SUPREP BOWEL PREP KIT 17.5-3.13-1.6 GM/177ML PO SOLN: 1.0000 | Freq: Once | ORAL | Status: DC

## 2014-10-27 NOTE — Progress Notes (Signed)
No allergies to eggs or soy No diet/weight loss meds No home oxygen No past problems with anesthesia  Has email  Emmi instructions given for colonoscopy 

## 2014-10-29 DIAGNOSIS — L043 Acute lymphadenitis of lower limb: Secondary | ICD-10-CM | POA: Insufficient documentation

## 2014-10-29 NOTE — Assessment & Plan Note (Addendum)
Unclear etiology and ultrasound results unable to clarify diagnosis. With patient history of cancer, will refer to general surgery for consideration and started on antibiotics to treat possible infectious etiology

## 2014-10-29 NOTE — Progress Notes (Signed)
Aaron Dyer  [id number] 1965-07-07 10/29/2014      Progress Note-Follow Up  Subjective  Chief Complaint  Chief Complaint  Patient presents with  . Groin Pain    gnot sore, tender to touch, noticed it monday night. Size has been about the same.     HPI  Patient is a 49 y.o. male in today for routine medical care. Patient in today accompanied by his wife. He has had 2 more squamous cell carcinoma since last seen. He continues to struggle with low back pain. Notes some dysuria but no hematuria Is complaining of pain and swelling in his right groin as well as low-grade constipation, feverd and anorexia. Denies CP/palp/SOB/HA/congestion/fevers. Taking meds as prescribed  Past Medical History  Diagnosis Date  . Chicken pox as a child  . Mumps as a child  . Anxiety and depression   . Hyperlipidemia   . Rectal bleeding 04/29/2011  . Urinary hesitancy 04/30/2011  . Hematuria 04/30/2011  . IBS (irritable bowel syndrome)   . Depression   . GERD (gastroesophageal reflux disease)   . Colon polyps 05/28/2011  . Insomnia 05/28/2011  . Cancer     face, arms, legs, and ear, SCC  . Hemorrhoids 04/29/2011  . Lipoma 04/21/2012    On back near left shoulder blade and above right hip  . Sun-damaged skin 04/21/2012  . Laceration of skin of scalp 09/05/2012    right  . Skin cancer     face, arms, legs, and ear skin cancer Followed with surgeon who has retired per patient, has had 2 Mohs procedures on his right ear and 2 lesions excised, one form each leg.   Marland Kitchen Urinary frequency 04/30/2011  . Back pain 03/15/2013  . ED (erectile dysfunction) 08/22/2013  . Elevated WBCs 04/10/2014    Past Surgical History  Procedure Laterality Date  . Remove squamous cell      b/l legs  . Mohs surgery  2010    right ear, twice    Family History  Problem Relation Age of Onset  . COPD Mother   . Emphysema Mother   . Stroke Mother     X 2  . Heart disease Mother     CABG in her 77s  . Hyperlipidemia Mother   .  Hypertension Mother   . Diabetes Mother     type 2  . Cancer Father     melanoma  . Stroke Father     41  . Hypertension Father   . Alcohol abuse Father   . Diabetes Maternal Grandmother     type 2  . Heart disease Maternal Grandmother     CHF  . Heart attack Maternal Grandfather   . Heart disease Paternal Grandmother   . Heart disease Paternal Grandfather   . Colon cancer Neg Hx   . Colon polyps Neg Hx     History   Social History  . Marital Status: Married    Spouse Name: N/A  . Number of Children: 2  . Years of Education: N/A   Occupational History  . Landscape    Social History Main Topics  . Smoking status: Never Smoker   . Smokeless tobacco: Never Used  . Alcohol Use: 0.0 oz/week    0 Standard drinks or equivalent per week     Comment: twice monthly  . Drug Use: No  . Sexual Activity: Yes   Other Topics Concern  . Not on file   Social History Narrative  Current Outpatient Prescriptions on File Prior to Visit  Medication Sig Dispense Refill  . aspirin 81 MG tablet Take 81 mg by mouth daily.    Marland Kitchen buPROPion (WELLBUTRIN XL) 300 MG 24 hr tablet Take 1 tablet (300 mg total) by mouth daily. 90 tablet 3  . FLUoxetine (PROZAC) 20 MG capsule Take 1 capsule (20 mg total) by mouth daily. 90 capsule 3  . fluticasone (FLONASE) 50 MCG/ACT nasal spray Place 2 sprays into both nostrils every morning. 16 g 6  . mupirocin ointment (BACTROBAN) 2 % APPLY TO NARES EVERY NIGHT AT BEDTIME AS NEEDED FOR LESIONS 22 g 1  . ranitidine (ZANTAC) 300 MG tablet Take 1 tablet (300 mg total) by mouth at bedtime. 90 tablet 3  . nitroGLYCERIN (NITROSTAT) 0.4 MG SL tablet Place 1 tablet (0.4 mg total) under the tongue every 5 (five) minutes as needed for chest pain. After 3 doses cp still present proceed to ER 25 tablet 3   No current facility-administered medications on file prior to visit.    Allergies  Allergen Reactions  . Crestor [Rosuvastatin]     myalgias    Review of  Systems  Review of Systems  Constitutional: Positive for fever. Negative for malaise/fatigue.  HENT: Negative for congestion.   Eyes: Negative for discharge.  Respiratory: Negative for shortness of breath.   Cardiovascular: Negative for chest pain, palpitations and leg swelling.  Gastrointestinal: Positive for constipation. Negative for nausea, abdominal pain and diarrhea.  Genitourinary: Positive for dysuria. Negative for urgency, frequency, hematuria and flank pain.  Musculoskeletal: Positive for back pain and joint pain. Negative for falls and neck pain.  Skin: Negative for rash.  Neurological: Negative for loss of consciousness and headaches.  Endo/Heme/Allergies: Negative for polydipsia.  Psychiatric/Behavioral: Negative for depression and suicidal ideas. The patient is not nervous/anxious and does not have insomnia.     Objective  BP 118/78 mmHg  Pulse 81  Temp(Src) 98.2 F (36.8 C) (Oral)  Resp 18  Ht 5\' 8"  (1.727 m)  Wt 163 lb (73.936 kg)  BMI 24.79 kg/m2  SpO2 98%  Physical Exam  Physical Exam  Constitutional: He is oriented to person, place, and time and well-developed, well-nourished, and in no distress. No distress.  HENT:  Head: Normocephalic and atraumatic.  Eyes: Conjunctivae are normal.  Neck: Neck supple. No thyromegaly present.  Cardiovascular: Normal rate, regular rhythm and normal heart sounds.   No murmur heard. Pulmonary/Chest: Effort normal and breath sounds normal. No respiratory distress.  Abdominal: He exhibits no distension and no mass. There is no tenderness.  Musculoskeletal: He exhibits no edema.  Firm nodule noted roughly 2 cm in right groin. Mildly tender to palp  Neurological: He is alert and oriented to person, place, and time.  Skin: Skin is warm.  Psychiatric: Memory, affect and judgment normal.    Lab Results  Component Value Date   TSH 1.57 09/02/2014   Lab Results  Component Value Date   WBC 11.7* 10/13/2014   HGB 15.0  10/13/2014   HCT 44.0 10/13/2014   MCV 93.6 10/13/2014   PLT 281.0 10/13/2014   Lab Results  Component Value Date   CREATININE 0.92 09/02/2014   BUN 15 09/02/2014   NA 138 09/02/2014   K 3.9 09/02/2014   CL 104 09/02/2014   CO2 29 09/02/2014   Lab Results  Component Value Date   ALT 18 09/02/2014   AST 17 09/02/2014   ALKPHOS 84 09/02/2014   BILITOT 0.5 09/02/2014  Lab Results  Component Value Date   CHOL 210* 09/02/2014   Lab Results  Component Value Date   HDL 36.20* 09/02/2014   Lab Results  Component Value Date   LDLCALC 152* 09/02/2014   Lab Results  Component Value Date   TRIG 110.0 09/02/2014   Lab Results  Component Value Date   CHOLHDL 6 09/02/2014     Assessment & Plan  Reflux Avoid offending foods, start probiotics. Do not eat large meals in late evening and consider raising head of bed.   Anxiety and depression Doing well on current meds  Colon polyps Needs continued surveillance  Skin cancer Continues to routinely have new lesions excised follows closely with dermatology  Acute lymphadenitis of lower extremity Unclear etiology and ultrasound results unable to clarify diagnosis. With patient history of cancer, will refer to general surgery for consideration and started on antibiotics to treat possible infectious etiology

## 2014-10-29 NOTE — Assessment & Plan Note (Signed)
Avoid offending foods, start probiotics. Do not eat large meals in late evening and consider raising head of bed.  

## 2014-10-29 NOTE — Assessment & Plan Note (Addendum)
Doing well on current meds.

## 2014-10-29 NOTE — Assessment & Plan Note (Signed)
Needs continued surveillance

## 2014-10-29 NOTE — Assessment & Plan Note (Signed)
Continues to routinely have new lesions excised follows closely with dermatology

## 2014-11-10 ENCOUNTER — Encounter: Payer: Self-pay | Admitting: Internal Medicine

## 2014-11-10 ENCOUNTER — Ambulatory Visit (AMBULATORY_SURGERY_CENTER): Payer: BLUE CROSS/BLUE SHIELD | Admitting: Internal Medicine

## 2014-11-10 VITALS — BP 160/74 | HR 56 | Temp 96.9°F | Resp 23 | Ht 70.0 in | Wt 166.0 lb

## 2014-11-10 DIAGNOSIS — D123 Benign neoplasm of transverse colon: Secondary | ICD-10-CM

## 2014-11-10 DIAGNOSIS — K635 Polyp of colon: Secondary | ICD-10-CM | POA: Diagnosis not present

## 2014-11-10 DIAGNOSIS — Z86010 Personal history of colonic polyps: Secondary | ICD-10-CM

## 2014-11-10 MED ORDER — SODIUM CHLORIDE 0.9 % IV SOLN: 500.0000 mL | INTRAVENOUS | Status: DC

## 2014-11-10 NOTE — Op Note (Signed)
[facility]  [address] [place], [address]   COLONOSCOPY PROCEDURE REPORT  PATIENT: Aaron Dyer, Aaron Dyer  MR#: [id number] BIRTHDATE: Jan 16, 1966 , 48  yrs. old GENDER: male ENDOSCOPIST: Jerene Bears, MD PROCEDURE DATE:  11/10/2014 PROCEDURE:   Colonoscopy, surveillance and Colonoscopy with snare polypectomy First Screening Colonoscopy - Avg.  risk and is 50 yrs.  old or older - No.  Prior Negative Screening - Now for repeat screening. N/A  History of Adenoma - Now for follow-up colonoscopy & has been > or = to 3 yrs.  Yes hx of adenoma.  Has been 3 or more years since last colonoscopy.  Polyps removed today? Yes ASA CLASS:   Class II INDICATIONS:Surveillance due to prior colonic neoplasia and PH Colon Adenoma, last colonoscopy 3 years ago MEDICATIONS: Monitored anesthesia care and Propofol 300 mg IV  DESCRIPTION OF PROCEDURE:   After the risks benefits and alternatives of the procedure were thoroughly explained, informed consent was obtained.  The digital rectal exam revealed no rectal mass.   The LB [id number]  endoscope was introduced through the anus and advanced to the cecum, which was identified by both the appendix and ileocecal valve. No adverse events experienced. The quality of the prep was good.  (Suprep was used)  The instrument was then slowly withdrawn as the colon was fully examined. Estimated blood loss is zero unless otherwise noted in this procedure report.  COLON FINDINGS: Two sessile polyps ranging between 3-72mm in size were found in the transverse colon and sigmoid colon. Polypectomies were performed with a cold snare.  The resection was complete, the polyp tissue was completely retrieved and sent to histology.   There was mild diverticulosis noted in the sigmoid colon.  Retroflexed views revealed internal hemorrhoids. The time to cecum = 3.9 Withdrawal time = 14.0   The scope was withdrawn and the procedure completed. COMPLICATIONS:  There were no immediate complications.  ENDOSCOPIC IMPRESSION: 1.   Two sessile polyps ranging between 3-2mm in size were found in the transverse colon and sigmoid colon; polypectomies were performed with a cold snare 2.   Mild diverticulosis was noted in the sigmoid colon  RECOMMENDATIONS: 1.  Await pathology results 2.  High fiber diet 3.  Repeat Colonoscopy in 5 years. 4.  You will receive a letter within 1-2 weeks with the results of your biopsy as well as final recommendations.  Please call my office if you have not received a letter after 3 weeks. 5.  If hemorrhoidal bleeding continues to be an issue  patient is a candidate for nonsurgical hemorrhoidal banding in the office  eSigned:  Jerene Bears, MD 11/10/2014 10:53 AM   cc: Willette Alma, MD and The Patient

## 2014-11-10 NOTE — Progress Notes (Signed)
Called to room to assist during endoscopic procedure.  Patient ID and intended procedure confirmed with present staff. Received instructions for my participation in the procedure from the performing physician.  

## 2014-11-10 NOTE — Patient Instructions (Signed)
YOU HAD AN ENDOSCOPIC PROCEDURE TODAY AT [facility]:   Refer to the procedure report that was given to you for any specific questions about what was found during the examination.  If the procedure report does not answer your questions, please call your gastroenterologist to clarify.  If you requested that your care partner not be given the details of your procedure findings, then the procedure report has been included in a sealed envelope for you to review at your convenience later.  YOU SHOULD EXPECT: Some feelings of bloating in the abdomen. Passage of more gas than usual.  Walking can help get rid of the air that was put into your GI tract during the procedure and reduce the bloating. If you had a lower endoscopy (such as a colonoscopy or flexible sigmoidoscopy) you may notice spotting of blood in your stool or on the toilet paper. If you underwent a bowel prep for your procedure, you may not have a normal bowel movement for a few days.  Please Note:  You might notice some irritation and congestion in your nose or some drainage.  This is from the oxygen used during your procedure.  There is no need for concern and it should clear up in a day or so.  SYMPTOMS TO REPORT IMMEDIATELY:   Following lower endoscopy (colonoscopy or flexible sigmoidoscopy):  Excessive amounts of blood in the stool  Significant tenderness or worsening of abdominal pains  Swelling of the abdomen that is new, acute  Fever of 100F or higher   For urgent or emergent issues, a gastroenterologist can be reached at any hour by calling [phone].   DIET: Your first meal following the procedure should be a small meal and then it is ok to progress to your normal diet. Heavy or fried foods are harder to digest and may make you feel nauseous or bloated.  Likewise, meals heavy in dairy and vegetables can increase bloating.  Drink plenty of fluids but you should avoid alcoholic beverages for 24  hours.  ACTIVITY:  You should plan to take it easy for the rest of today and you should NOT DRIVE or use heavy machinery until tomorrow (because of the sedation medicines used during the test).    FOLLOW UP: Our staff will call the number listed on your records the next business day following your procedure to check on you and address any questions or concerns that you may have regarding the information given to you following your procedure. If we do not reach you, we will leave a message.  However, if you are feeling well and you are not experiencing any problems, there is no need to return our call.  We will assume that you have returned to your regular daily activities without incident.  If any biopsies were taken you will be contacted by phone or by letter within the next 1-3 weeks.  Please call us at [phone] if you have not heard about the biopsies in 3 weeks.    SIGNATURES/CONFIDENTIALITY: You and/or your care partner have signed paperwork which will be entered into your electronic medical record.  These signatures attest to the fact that that the information above on your After Visit Summary has been reviewed and is understood.  Full responsibility of the confidentiality of this discharge information lies with you and/or your care -partner.  Await pathology report Diverticulosis/Polyp handout given High fiber diet Repeat Colonoscopy in 5 years

## 2014-11-10 NOTE — Progress Notes (Signed)
Transferred to recovery room. A/O x3, pleased with MAC.  VSS.  Report to Jill, RN. 

## 2014-11-11 ENCOUNTER — Telehealth: Payer: Self-pay

## 2014-11-11 NOTE — Telephone Encounter (Signed)
Left message on answering machine. 

## 2014-11-15 ENCOUNTER — Encounter: Payer: Self-pay | Admitting: Internal Medicine

## 2014-12-05 ENCOUNTER — Ambulatory Visit (INDEPENDENT_AMBULATORY_CARE_PROVIDER_SITE_OTHER): Payer: BLUE CROSS/BLUE SHIELD | Admitting: Family Medicine

## 2014-12-05 VITALS — BP 118/76 | HR 79 | Temp 98.0°F | Resp 16 | Ht 68.0 in | Wt 165.2 lb

## 2014-12-05 DIAGNOSIS — C4490 Unspecified malignant neoplasm of skin, unspecified: Secondary | ICD-10-CM

## 2014-12-05 DIAGNOSIS — M545 Low back pain: Secondary | ICD-10-CM | POA: Diagnosis not present

## 2014-12-05 DIAGNOSIS — K5730 Diverticulosis of large intestine without perforation or abscess without bleeding: Secondary | ICD-10-CM

## 2014-12-05 DIAGNOSIS — F329 Major depressive disorder, single episode, unspecified: Secondary | ICD-10-CM

## 2014-12-05 DIAGNOSIS — M1990 Unspecified osteoarthritis, unspecified site: Secondary | ICD-10-CM

## 2014-12-05 DIAGNOSIS — IMO0001 Reserved for inherently not codable concepts without codable children: Secondary | ICD-10-CM

## 2014-12-05 DIAGNOSIS — K219 Gastro-esophageal reflux disease without esophagitis: Secondary | ICD-10-CM

## 2014-12-05 DIAGNOSIS — F419 Anxiety disorder, unspecified: Secondary | ICD-10-CM

## 2014-12-05 DIAGNOSIS — M542 Cervicalgia: Secondary | ICD-10-CM

## 2014-12-05 DIAGNOSIS — L043 Acute lymphadenitis of lower limb: Secondary | ICD-10-CM

## 2014-12-05 DIAGNOSIS — F418 Other specified anxiety disorders: Secondary | ICD-10-CM

## 2014-12-05 NOTE — Progress Notes (Signed)
Pre visit review using our clinic review tool, if applicable. No additional management support is needed unless otherwise documented below in the visit note. 

## 2014-12-05 NOTE — Patient Instructions (Signed)
Back Pain, Adult Low back pain is very common. About 1 in 5 people have back pain.The cause of low back pain is rarely dangerous. The pain often gets better over time.About half of people with a sudden onset of back pain feel better in just 2 weeks. About 8 in 10 people feel better by 6 weeks.  CAUSES Some common causes of back pain include:  Strain of the muscles or ligaments supporting the spine.  Wear and tear (degeneration) of the spinal discs.  Arthritis.  Direct injury to the back. DIAGNOSIS Most of the time, the direct cause of low back pain is not known.However, back pain can be treated effectively even when the exact cause of the pain is unknown.Answering your caregiver's questions about your overall health and symptoms is one of the most accurate ways to make sure the cause of your pain is not dangerous. If your caregiver needs more information, he or she may order lab work or imaging tests (X-rays or MRIs).However, even if imaging tests show changes in your back, this usually does not require surgery. HOME CARE INSTRUCTIONS For many people, back pain returns.Since low back pain is rarely dangerous, it is often a condition that people can learn to manageon their own.   Remain active. It is stressful on the back to sit or stand in one place. Do not sit, drive, or stand in one place for more than 30 minutes at a time. Take short walks on level surfaces as soon as pain allows.Try to increase the length of time you walk each day.  Do not stay in bed.Resting more than 1 or 2 days can delay your recovery.  Do not avoid exercise or work.Your body is made to move.It is not dangerous to be active, even though your back may hurt.Your back will likely heal faster if you return to being active before your pain is gone.  Pay attention to your body when you bend and lift. Many people have less discomfortwhen lifting if they bend their knees, keep the load close to their bodies,and  avoid twisting. Often, the most comfortable positions are those that put less stress on your recovering back.  Find a comfortable position to sleep. Use a firm mattress and lie on your side with your knees slightly bent. If you lie on your back, put a pillow under your knees.  Only take over-the-counter or prescription medicines as directed by your caregiver. Over-the-counter medicines to reduce pain and inflammation are often the most helpful.Your caregiver may prescribe muscle relaxant drugs.These medicines help dull your pain so you can more quickly return to your normal activities and healthy exercise.  Put ice on the injured area.  Put ice in a plastic bag.  Place a towel between your skin and the bag.  Leave the ice on for 15-20 minutes, 03-04 times a day for the first 2 to 3 days. After that, ice and heat may be alternated to reduce pain and spasms.  Ask your caregiver about trying back exercises and gentle massage. This may be of some benefit.  Avoid feeling anxious or stressed.Stress increases muscle tension and can worsen back pain.It is important to recognize when you are anxious or stressed and learn ways to manage it.Exercise is a great option. SEEK MEDICAL CARE IF:  You have pain that is not relieved with rest or medicine.  You have pain that does not improve in 1 week.  You have new symptoms.  You are generally not feeling well. SEEK   IMMEDIATE MEDICAL CARE IF:   You have pain that radiates from your back into your legs.  You develop new bowel or bladder control problems.  You have unusual weakness or numbness in your arms or legs.  You develop nausea or vomiting.  You develop abdominal pain.  You feel faint. Document Released: 03/11/2005 Document Revised: 09/10/2011 Document Reviewed: 07/13/2013 ExitCare Patient Information 2015 [employer]. This information is not intended to replace advice given to you by your health care provider. Make sure you  discuss any questions you have with your health care provider.  

## 2014-12-18 ENCOUNTER — Encounter: Payer: Self-pay | Admitting: Family Medicine

## 2014-12-18 DIAGNOSIS — K5730 Diverticulosis of large intestine without perforation or abscess without bleeding: Secondary | ICD-10-CM

## 2014-12-18 HISTORY — DX: Diverticulosis of large intestine without perforation or abscess without bleeding: K57.30

## 2014-12-18 NOTE — Assessment & Plan Note (Signed)
Continues to struggle with is managing with current meds and family support. Is no longer able to work and has been approved for disability

## 2014-12-18 NOTE — Assessment & Plan Note (Signed)
Encouraged to maintain adequate hydration, fiber and exercise.

## 2014-12-18 NOTE — Assessment & Plan Note (Signed)
Avoid offending foods, start probiotics. Do not eat large meals in late evening and consider raising head of bed.  

## 2014-12-18 NOTE — Assessment & Plan Note (Signed)
Resolved and patient is feeling better.

## 2014-12-18 NOTE — Progress Notes (Signed)
Subjective:    Patient ID: Aaron Dyer, male    DOB: November 15, 1965, 49 y.o.   MRN: [id number]  Chief Complaint  Patient presents with  . Follow-up  . Back Pain    per result notes 7/21- MRI if symptoms persist, pt has numbness in extremities in morning, back pain     HPI Patient is in today for follow-up accompanied by his wife. Recent colonoscopy has confirmed diverticulosis but he denies any GI complaints. He has had 2 more squamous cell carcinomas diagnosed and has undergone a Mohs procedure and tolerated it well. His swelling in his groin and discomfort have resolved. No recent illness or other acute complaints noted. Continues to struggle with depression anxiety but denies any suicidal ideation. There is some persistent anhedonia but he is pleased that his disability has been approved. Denies CP/palp/SOB/HA/congestion/fevers/GI or GU c/o. Taking meds as prescribed  Past Medical History  Diagnosis Date  . Chicken pox as a child  . Mumps as a child  . Anxiety and depression   . Hyperlipidemia   . Rectal bleeding 04/29/2011  . Urinary hesitancy 04/30/2011  . Hematuria 04/30/2011  . IBS (irritable bowel syndrome)   . Depression   . GERD (gastroesophageal reflux disease)   . Colon polyps 05/28/2011  . Insomnia 05/28/2011  . Cancer     face, arms, legs, and ear, SCC  . Hemorrhoids 04/29/2011  . Lipoma 04/21/2012    On back near left shoulder blade and above right hip  . Sun-damaged skin 04/21/2012  . Laceration of skin of scalp 09/05/2012    right  . Skin cancer     face, arms, legs, and ear skin cancer Followed with surgeon who has retired per patient, has had 2 Mohs procedures on his right ear and 2 lesions excised, one form each leg.   Marland Kitchen Urinary frequency 04/30/2011  . Back pain 03/15/2013  . ED (erectile dysfunction) 08/22/2013  . Elevated WBCs 04/10/2014  . Diverticulosis of colon without hemorrhage 12/18/2014    Past Surgical History  Procedure Laterality Date  . Remove squamous  cell      b/l legs  . Mohs surgery  2010    right ear, twice    Family History  Problem Relation Age of Onset  . COPD Mother   . Emphysema Mother   . Stroke Mother     X 2  . Heart disease Mother     CABG in her 36s  . Hyperlipidemia Mother   . Hypertension Mother   . Diabetes Mother     type 2  . Cancer Father     melanoma  . Stroke Father     2  . Hypertension Father   . Alcohol abuse Father   . Heart disease Father   . Diabetes Maternal Grandmother     type 2  . Heart disease Maternal Grandmother     CHF  . Heart attack Maternal Grandfather   . Heart disease Paternal Grandmother   . Heart disease Paternal Grandfather   . Colon cancer Neg Hx   . Colon polyps Neg Hx     Social History   Social History  . Marital Status: Married    Spouse Name: N/A  . Number of Children: 2  . Years of Education: N/A   Occupational History  . Landscape    Social History Main Topics  . Smoking status: Never Smoker   . Smokeless tobacco: Never Used  . Alcohol Use: 0.0  oz/week    0 Standard drinks or equivalent per week     Comment: twice monthly  . Drug Use: No  . Sexual Activity: Yes   Other Topics Concern  . Not on file   Social History Narrative    Outpatient Prescriptions Prior to Visit  Medication Sig Dispense Refill  . aspirin 81 MG tablet Take 81 mg by mouth daily.    Marland Kitchen buPROPion (WELLBUTRIN XL) 300 MG 24 hr tablet Take 1 tablet (300 mg total) by mouth daily. 90 tablet 3  . FLUoxetine (PROZAC) 20 MG capsule Take 1 capsule (20 mg total) by mouth daily. 90 capsule 3  . fluticasone (FLONASE) 50 MCG/ACT nasal spray Place 2 sprays into both nostrils every morning. 16 g 6  . mupirocin ointment (BACTROBAN) 2 % APPLY TO NARES EVERY NIGHT AT BEDTIME AS NEEDED FOR LESIONS 22 g 1  . nitroGLYCERIN (NITROSTAT) 0.4 MG SL tablet Place 1 tablet (0.4 mg total) under the tongue every 5 (five) minutes as needed for chest pain. After 3 doses cp still present proceed to ER 25  tablet 3  . ranitidine (ZANTAC) 300 MG tablet Take 1 tablet (300 mg total) by mouth at bedtime. 90 tablet 3   No facility-administered medications prior to visit.    Allergies  Allergen Reactions  . Crestor [Rosuvastatin]     myalgias    Review of Systems  Constitutional: Negative for fever and malaise/fatigue.  HENT: Negative for congestion.   Eyes: Negative for discharge.  Respiratory: Negative for shortness of breath.   Cardiovascular: Negative for chest pain, palpitations and leg swelling.  Gastrointestinal: Negative for nausea and abdominal pain.  Genitourinary: Negative for dysuria.  Musculoskeletal: Negative for falls.  Skin: Negative for rash.  Neurological: Negative for loss of consciousness and headaches.  Endo/Heme/Allergies: Negative for environmental allergies.  Psychiatric/Behavioral: Positive for depression. The patient is nervous/anxious.        Objective:    Physical Exam  Constitutional: He is oriented to person, place, and time. He appears well-developed and well-nourished. No distress.  HENT:  Head: Normocephalic and atraumatic.  Nose: Nose normal.  Eyes: Right eye exhibits no discharge. Left eye exhibits no discharge.  Neck: Normal range of motion. Neck supple.  Cardiovascular: Normal rate and regular rhythm.   No murmur heard. Pulmonary/Chest: Effort normal and breath sounds normal.  Abdominal: Soft. Bowel sounds are normal. There is no tenderness.  Musculoskeletal: He exhibits no edema.  Neurological: He is alert and oriented to person, place, and time.  Skin: Skin is warm and dry.  Psychiatric: He has a normal mood and affect.  Nursing note and vitals reviewed.   BP 118/76 mmHg  Pulse 79  Temp(Src) 98 F (36.7 C) (Oral)  Resp 16  Ht 5\' 8"  (1.727 m)  Wt 165 lb 3.2 oz (74.934 kg)  BMI 25.12 kg/m2  SpO2 97% Wt Readings from Last 3 Encounters:  12/05/14 165 lb 3.2 oz (74.934 kg)  11/10/14 166 lb (75.297 kg)  10/27/14 166 lb 12.8 oz  (75.66 kg)     Lab Results  Component Value Date   WBC 11.7* 10/13/2014   HGB 15.0 10/13/2014   HCT 44.0 10/13/2014   PLT 281.0 10/13/2014   GLUCOSE 97 09/02/2014   CHOL 210* 09/02/2014   TRIG 110.0 09/02/2014   HDL 36.20* 09/02/2014   LDLDIRECT 130.2 04/04/2014   LDLCALC 152* 09/02/2014   ALT 18 09/02/2014   AST 17 09/02/2014   NA 138 09/02/2014   K  3.9 09/02/2014   CL 104 09/02/2014   CREATININE 0.92 09/02/2014   BUN 15 09/02/2014   CO2 29 09/02/2014   TSH 1.57 09/02/2014   PSA 0.42 11/25/2013    Lab Results  Component Value Date   TSH 1.57 09/02/2014   Lab Results  Component Value Date   WBC 11.7* 10/13/2014   HGB 15.0 10/13/2014   HCT 44.0 10/13/2014   MCV 93.6 10/13/2014   PLT 281.0 10/13/2014   Lab Results  Component Value Date   NA 138 09/02/2014   K 3.9 09/02/2014   CO2 29 09/02/2014   GLUCOSE 97 09/02/2014   BUN 15 09/02/2014   CREATININE 0.92 09/02/2014   BILITOT 0.5 09/02/2014   ALKPHOS 84 09/02/2014   AST 17 09/02/2014   ALT 18 09/02/2014   PROT 7.3 09/02/2014   ALBUMIN 4.6 09/02/2014   CALCIUM 9.5 09/02/2014   GFR 92.70 09/02/2014   Lab Results  Component Value Date   CHOL 210* 09/02/2014   Lab Results  Component Value Date   HDL 36.20* 09/02/2014   Lab Results  Component Value Date   LDLCALC 152* 09/02/2014   Lab Results  Component Value Date   TRIG 110.0 09/02/2014   Lab Results  Component Value Date   CHOLHDL 6 09/02/2014   No results found for: HGBA1C     Assessment & Plan:   Problem List Items Addressed This Visit    Skin cancer    Recently diagnosed with new SCC on left thigh and left hand, recent Mohs procedure      Reflux    Avoid offending foods, start probiotics. Do not eat large meals in late evening and consider raising head of bed.       Diverticulosis of colon without hemorrhage    Encouraged to maintain adequate hydration, fiber and exercise.      Back pain   Relevant Orders   MR Cervical  Spine Wo Contrast   MR Lumbar Spine Wo Contrast   Anxiety and depression    Continues to struggle with is managing with current meds and family support. Is no longer able to work and has been approved for disability      Acute lymphadenitis of lower extremity    Resolved and patient is feeling better.       Other Visit Diagnoses    Neck pain    -  Primary    Relevant Orders    MR Cervical Spine Wo Contrast    MR Lumbar Spine Wo Contrast    Arthritis        Relevant Orders    MR Cervical Spine Wo Contrast    MR Lumbar Spine Wo Contrast       I am having Mr. Bui maintain his aspirin, nitroGLYCERIN, buPROPion, FLUoxetine, ranitidine, fluticasone, and mupirocin ointment.  No orders of the defined types were placed in this encounter.     Penni Homans, MD

## 2014-12-18 NOTE — Assessment & Plan Note (Signed)
Recently diagnosed with new SCC on left thigh and left hand, recent Mohs procedure

## 2015-01-24 ENCOUNTER — Ambulatory Visit: Payer: BLUE CROSS/BLUE SHIELD | Admitting: Family Medicine

## 2015-04-13 ENCOUNTER — Telehealth: Payer: Self-pay | Admitting: Medical

## 2015-04-13 ENCOUNTER — Ambulatory Visit (INDEPENDENT_AMBULATORY_CARE_PROVIDER_SITE_OTHER): Payer: BLUE CROSS/BLUE SHIELD | Admitting: Medical

## 2015-04-13 ENCOUNTER — Encounter: Payer: Self-pay | Admitting: Medical

## 2015-04-13 VITALS — BP 110/78 | HR 88 | Temp 98.0°F | Ht 68.0 in | Wt 162.4 lb

## 2015-04-13 DIAGNOSIS — N400 Enlarged prostate without lower urinary tract symptoms: Secondary | ICD-10-CM | POA: Diagnosis not present

## 2015-04-13 DIAGNOSIS — M545 Low back pain, unspecified: Secondary | ICD-10-CM

## 2015-04-13 DIAGNOSIS — N5082 Scrotal pain: Secondary | ICD-10-CM | POA: Diagnosis not present

## 2015-04-13 DIAGNOSIS — N509 Disorder of male genital organs, unspecified: Secondary | ICD-10-CM

## 2015-04-13 DIAGNOSIS — R102 Pelvic and perineal pain: Secondary | ICD-10-CM

## 2015-04-13 LAB — POC URINALSYSI DIPSTICK (AUTOMATED)
Bilirubin, UA: NEGATIVE
Blood, UA: NEGATIVE
Glucose, UA: NEGATIVE
KETONES UA: NEGATIVE
LEUKOCYTES UA: NEGATIVE
Nitrite, UA: NEGATIVE
Protein, UA: NEGATIVE
Urobilinogen, UA: 0.2
pH, UA: 6

## 2015-04-13 MED ORDER — FLUTICASONE PROPIONATE 50 MCG/ACT NA SUSP: 2.0000 | Freq: Every morning | NASAL | Status: DC

## 2015-04-13 MED ORDER — DICLOFENAC SODIUM 75 MG PO TBEC: 75.0000 mg | DELAYED_RELEASE_TABLET | Freq: Two times a day (BID) | ORAL | Status: DC

## 2015-04-13 MED ORDER — CIPROFLOXACIN HCL 500 MG PO TABS: 500.0000 mg | ORAL_TABLET | Freq: Two times a day (BID) | ORAL | Status: DC

## 2015-04-13 MED ORDER — HYDROCODONE-ACETAMINOPHEN 5-325 MG PO TABS: 1.0000 | ORAL_TABLET | Freq: Four times a day (QID) | ORAL | Status: DC | PRN

## 2015-04-13 NOTE — Patient Instructions (Addendum)
For your low back pain. Will prescribe diclofenac and norco.  With your recent perineum pain and scrotal area pain will get ua,  psa, cbc and prescribe cipro. These symptom may represent prostatitis.  If you get leg weakness, numbness, loss of bladder function, incontinence then ED evaluation.  For nasal congestion will prescribe flonase. May be allergic rhinitis.  Follow up in 7-10 days or as needed

## 2015-04-13 NOTE — Progress Notes (Signed)
Pre visit review using our clinic review tool, if applicable. No additional management support is needed unless otherwise documented below in the visit note. 

## 2015-04-13 NOTE — Progress Notes (Signed)
Subjective:    Patient ID: Aaron Dyer, male    DOB: Jan 23, 1966, 50 y.o.   MRN: [id number]  HPI   Pt in low back pain. Pt has been off and on in the past. Usually in past would be mild and transient in past and would taper off no treatment/meds.   Recent flare of pain for 2 weeks. No particular injury or fall. Over last 2 weeks gradual onset. He states pain level is high.No radiating pain to legs. No leg weakness and no incontinence.     He notes some scrotal area pain. In past when he had back pain no scrotum region pain.  Pt has felt some fevers.   But also some cough and nasal congestion symptoms. About one week.     Review of Systems  Constitutional: Negative for fever, chills and fatigue.  HENT: Positive for congestion and postnasal drip. Negative for ear pain, rhinorrhea, sinus pressure, sneezing and trouble swallowing.   Respiratory: Negative for cough, shortness of breath and wheezing.   Cardiovascular: Negative for chest pain and palpitations.  Gastrointestinal: Negative for abdominal pain.  Genitourinary: Negative for dysuria, urgency, frequency, hematuria, flank pain and difficulty urinating.       See hpi.  Perineum region pain.  Musculoskeletal: Positive for back pain.  Neurological: Negative for dizziness and headaches.  Hematological: Negative for adenopathy. Does not bruise/bleed easily.  Psychiatric/Behavioral: Negative for behavioral problems and confusion.    Past Medical History  Diagnosis Date  . Chicken pox as a child  . Mumps as a child  . Anxiety and depression   . Hyperlipidemia   . Rectal bleeding 04/29/2011  . Urinary hesitancy 04/30/2011  . Hematuria 04/30/2011  . IBS (irritable bowel syndrome)   . Depression   . GERD (gastroesophageal reflux disease)   . Colon polyps 05/28/2011  . Insomnia 05/28/2011  . Cancer (HCC)     face, arms, legs, and ear, SCC  . Hemorrhoids 04/29/2011  . Lipoma 04/21/2012    On back near left shoulder blade and  above right hip  . Sun-damaged skin 04/21/2012  . Laceration of skin of scalp 09/05/2012    right  . Skin cancer     face, arms, legs, and ear skin cancer Followed with surgeon who has retired per patient, has had 2 Mohs procedures on his right ear and 2 lesions excised, one form each leg.   Marland Kitchen Urinary frequency 04/30/2011  . Back pain 03/15/2013  . ED (erectile dysfunction) 08/22/2013  . Elevated WBCs 04/10/2014  . Diverticulosis of colon without hemorrhage 12/18/2014    Social History   Social History  . Marital Status: Married    Spouse Name: N/A  . Number of Children: 2  . Years of Education: N/A   Occupational History  . Landscape    Social History Main Topics  . Smoking status: Never Smoker   . Smokeless tobacco: Never Used  . Alcohol Use: 0.0 oz/week    0 Standard drinks or equivalent per week     Comment: twice monthly  . Drug Use: No  . Sexual Activity: Yes   Other Topics Concern  . Not on file   Social History Narrative    Past Surgical History  Procedure Laterality Date  . Remove squamous cell      b/l legs  . Mohs surgery  2010    right ear, twice    Family History  Problem Relation Age of Onset  . COPD Mother   .  Emphysema Mother   . Stroke Mother     X 2  . Heart disease Mother     CABG in her 57s  . Hyperlipidemia Mother   . Hypertension Mother   . Diabetes Mother     type 2  . Cancer Father     melanoma  . Stroke Father     3  . Hypertension Father   . Alcohol abuse Father   . Heart disease Father   . Diabetes Maternal Grandmother     type 2  . Heart disease Maternal Grandmother     CHF  . Heart attack Maternal Grandfather   . Heart disease Paternal Grandmother   . Heart disease Paternal Grandfather   . Colon cancer Neg Hx   . Colon polyps Neg Hx     Allergies  Allergen Reactions  . Crestor [Rosuvastatin]     myalgias    Current Outpatient Prescriptions on File Prior to Visit  Medication Sig Dispense Refill  . aspirin 81  MG tablet Take 81 mg by mouth daily.    Marland Kitchen buPROPion (WELLBUTRIN XL) 300 MG 24 hr tablet Take 1 tablet (300 mg total) by mouth daily. 90 tablet 3  . FLUoxetine (PROZAC) 20 MG capsule Take 1 capsule (20 mg total) by mouth daily. 90 capsule 3  . mupirocin ointment (BACTROBAN) 2 % APPLY TO NARES EVERY NIGHT AT BEDTIME AS NEEDED FOR LESIONS 22 g 1  . ranitidine (ZANTAC) 300 MG tablet Take 1 tablet (300 mg total) by mouth at bedtime. 90 tablet 3   No current facility-administered medications on file prior to visit.    BP 110/78 mmHg  Pulse 88  Temp(Src) 98 F (36.7 C) (Oral)  Ht 5\' 8"  (1.727 m)  Wt 162 lb 6.4 oz (73.664 kg)  BMI 24.70 kg/m2  SpO2 98%       Objective:   Physical Exam  General Appearance- Not in acute distress.   HEENT Head- Normal. Ear Auditory Canal - Left- Normal. Right - Normal.Tympanic Membrane- Left- Normal. Right- Normal. Eye Sclera/Conjunctiva- Left- Normal. Right- Normal. Nose & Sinuses Nasal Mucosa- Left-  Boggy and Congested. Right-  Boggy and  Congested.Bilateral no  maxillary and no  frontal sinus pressure. Mouth & Throat Lips: Upper Lip- Normal: no dryness, cracking, pallor, cyanosis, or vesicular eruption. Lower Lip-Normal: no dryness, cracking, pallor, cyanosis or vesicular eruption. Buccal Mucosa- Bilateral- No Aphthous ulcers. Oropharynx- No Discharge or Erythema. Tonsils: Characteristics- Bilateral- No Erythema or Congestion. Size/Enlargement- Bilateral- No enlargement. Discharge- bilateral-None.    Chest and Lung Exam Auscultation: Breath sounds:-Normal. Clear even and unlabored. Adventitious sounds:- No Adventitious sounds.  Cardiovascular Auscultation:Rythm - Regular, rate and rythm. Heart Sounds -Normal heart sounds.  Abdomen Inspection:-Inspection Normal.  Palpation/Perucssion: Palpation and Percussion of the abdomen reveal- Non Tender, No Rebound tenderness, No rigidity(Guarding) and No Palpable abdominal masses.  Liver:-Normal.    Spleen:- Normal.   Back Mid lumbar spine tenderness to palpation. No pain on straight leg lift. Pain on lateral movements and flexion/extension of the spine.  Lower ext neurologic  L5-S1 sensation intact bilaterally. Normal patellar reflexes bilaterally. No foot drop bilaterally.  Rectum- smooth prostate, no nodules. Some perinuem pain as well.      Assessment & Plan:  For your low back pain. Will prescribe diclofenac and norco.  With your recent perineum pain and scrotal area pain will get ua,  psa, cbc and prescribe cipro. These symptom may represent prostatitis.  If you get leg weakness, numbness, loss of  bladder function, incontinence then ED evaluation.  For nasal congestion will prescribe flonase. May be allergic rhinitis.  Follow up in 7-10 days or as needed

## 2015-04-13 NOTE — Telephone Encounter (Signed)
Will you look in to  Past mri order of his lumbar spine and referral to specialsit for his back pain. He states no one called hiim.

## 2015-04-14 DIAGNOSIS — Z85828 Personal history of other malignant neoplasm of skin: Secondary | ICD-10-CM | POA: Diagnosis not present

## 2015-04-14 DIAGNOSIS — C4330 Malignant melanoma of unspecified part of face: Secondary | ICD-10-CM | POA: Diagnosis not present

## 2015-04-14 DIAGNOSIS — D045 Carcinoma in situ of skin of trunk: Secondary | ICD-10-CM | POA: Diagnosis not present

## 2015-04-14 DIAGNOSIS — L570 Actinic keratosis: Secondary | ICD-10-CM | POA: Diagnosis not present

## 2015-04-14 DIAGNOSIS — D489 Neoplasm of uncertain behavior, unspecified: Secondary | ICD-10-CM | POA: Diagnosis not present

## 2015-04-14 DIAGNOSIS — C44212 Basal cell carcinoma of skin of right ear and external auricular canal: Secondary | ICD-10-CM | POA: Diagnosis not present

## 2015-04-14 LAB — CBC WITH DIFFERENTIAL/PLATELET
BASOS ABS: 0.1 10*3/uL (ref 0.0–0.1)
Basophils Relative: 0.4 % (ref 0.0–3.0)
Eosinophils Absolute: 0.2 10*3/uL (ref 0.0–0.7)
Eosinophils Relative: 1.5 % (ref 0.0–5.0)
HCT: 49.3 % (ref 39.0–52.0)
Hemoglobin: 16.5 g/dL (ref 13.0–17.0)
LYMPHS ABS: 3.4 10*3/uL (ref 0.7–4.0)
Lymphocytes Relative: 26.4 % (ref 12.0–46.0)
MCHC: 33.4 g/dL (ref 30.0–36.0)
MCV: 93.1 fl (ref 78.0–100.0)
MONO ABS: 0.7 10*3/uL (ref 0.1–1.0)
Monocytes Relative: 5 % (ref 3.0–12.0)
Neutro Abs: 8.7 10*3/uL — ABNORMAL HIGH (ref 1.4–7.7)
Neutrophils Relative %: 66.7 % (ref 43.0–77.0)
Platelets: 276 10*3/uL (ref 150.0–400.0)
RBC: 5.3 Mil/uL (ref 4.22–5.81)
RDW: 14.2 % (ref 11.5–15.5)
WBC: 13 10*3/uL — ABNORMAL HIGH (ref 4.0–10.5)

## 2015-04-14 LAB — PSA: PSA: 0.48 ng/mL (ref 0.10–4.00)

## 2015-04-14 NOTE — Telephone Encounter (Signed)
Yes, although patient never had done.

## 2015-04-14 NOTE — Telephone Encounter (Signed)
No referral has been placed for a specialist, please advise

## 2015-04-14 NOTE — Telephone Encounter (Signed)
Regarding specialist referral, I think he appointment with Dr. Charlett Blake in near future. They can discuss. But was mri order placed? I think I saw that order?

## 2015-04-14 NOTE — Telephone Encounter (Signed)
Can we reactivate those old mri request. Particularly the lumbar spine??

## 2015-04-17 NOTE — Telephone Encounter (Signed)
A new order will need to be placed.

## 2015-04-20 NOTE — Telephone Encounter (Signed)
Will defer re-order of mri to pcp.

## 2015-04-24 ENCOUNTER — Telehealth: Payer: Self-pay | Admitting: Family Medicine

## 2015-04-24 MED ORDER — DICLOFENAC SODIUM 75 MG PO TBEC: 75.0000 mg | DELAYED_RELEASE_TABLET | Freq: Two times a day (BID) | ORAL | Status: DC

## 2015-04-24 NOTE — Telephone Encounter (Signed)
Caller name: Sharyn Lull Relationship to patient: Wife Can be reached: [phone] Pharmacy: [facility] [address] [place] [phone] (Phone) [phone] (Fax)         Reason for call: Wife called stating that patient has completed the antibiotic and wants to know if he can get another rx for antibiotic

## 2015-04-24 NOTE — Telephone Encounter (Signed)
Called the patient and he is feeling much better.  Does not need the antibiotic refilled, but the medication giving for inflamation he may need refilled.  Advise if ok to refill

## 2015-04-24 NOTE — Telephone Encounter (Signed)
I did go ahead and refill his diclofenac.

## 2015-04-24 NOTE — Telephone Encounter (Signed)
Patient aware refill done

## 2015-05-10 DIAGNOSIS — Z888 Allergy status to other drugs, medicaments and biological substances status: Secondary | ICD-10-CM | POA: Diagnosis not present

## 2015-05-10 DIAGNOSIS — C44629 Squamous cell carcinoma of skin of left upper limb, including shoulder: Secondary | ICD-10-CM | POA: Diagnosis not present

## 2015-05-10 DIAGNOSIS — C44212 Basal cell carcinoma of skin of right ear and external auricular canal: Secondary | ICD-10-CM | POA: Diagnosis not present

## 2015-05-11 DIAGNOSIS — C44629 Squamous cell carcinoma of skin of left upper limb, including shoulder: Secondary | ICD-10-CM | POA: Insufficient documentation

## 2015-05-15 DIAGNOSIS — Z85828 Personal history of other malignant neoplasm of skin: Secondary | ICD-10-CM | POA: Diagnosis not present

## 2015-05-15 DIAGNOSIS — L570 Actinic keratosis: Secondary | ICD-10-CM | POA: Diagnosis not present

## 2015-05-15 DIAGNOSIS — Z85820 Personal history of malignant melanoma of skin: Secondary | ICD-10-CM | POA: Diagnosis not present

## 2015-05-15 DIAGNOSIS — D045 Carcinoma in situ of skin of trunk: Secondary | ICD-10-CM | POA: Diagnosis not present

## 2015-05-19 ENCOUNTER — Other Ambulatory Visit: Payer: Self-pay | Admitting: Medical

## 2015-05-29 ENCOUNTER — Encounter: Payer: Self-pay | Admitting: Family Medicine

## 2015-05-29 ENCOUNTER — Ambulatory Visit (INDEPENDENT_AMBULATORY_CARE_PROVIDER_SITE_OTHER): Payer: BLUE CROSS/BLUE SHIELD | Admitting: Family Medicine

## 2015-05-29 VITALS — BP 124/83 | HR 58 | Temp 97.9°F | Ht 68.0 in | Wt 166.1 lb

## 2015-05-29 DIAGNOSIS — IMO0001 Reserved for inherently not codable concepts without codable children: Secondary | ICD-10-CM

## 2015-05-29 DIAGNOSIS — R6889 Other general symptoms and signs: Secondary | ICD-10-CM

## 2015-05-29 DIAGNOSIS — M545 Low back pain: Secondary | ICD-10-CM

## 2015-05-29 DIAGNOSIS — Z23 Encounter for immunization: Secondary | ICD-10-CM

## 2015-05-29 DIAGNOSIS — D72829 Elevated white blood cell count, unspecified: Secondary | ICD-10-CM

## 2015-05-29 DIAGNOSIS — K219 Gastro-esophageal reflux disease without esophagitis: Secondary | ICD-10-CM

## 2015-05-29 DIAGNOSIS — C4490 Unspecified malignant neoplasm of skin, unspecified: Secondary | ICD-10-CM

## 2015-05-29 NOTE — Progress Notes (Addendum)
Patient ID: Aaron Dyer, male   DOB: 1965/04/11, 50 y.o.   MRN: [id number]   Subjective:    Patient ID: Aaron Dyer, male    DOB: [birth year], 50 y.o.   MRN: [id number]  Chief Complaint  Patient presents with  . Follow-up    HPI Patient is in today for follow up. Continues to have new skin cancers develop. He is following closely with dermatology. Is struggling with chronic low back pain and neck pain as well. No rfalls or trauma. Reports some cold intolerance. Denies CP/palp/SOB/HA/congestion/fevers/GI or GU c/o. Taking meds as prescribed  Past Medical History  Diagnosis Date  . Chicken pox as a child  . Mumps as a child  . Anxiety and depression   . Hyperlipidemia   . Rectal bleeding 04/29/2011  . Urinary hesitancy 04/30/2011  . Hematuria 04/30/2011  . IBS (irritable bowel syndrome)   . Depression   . GERD (gastroesophageal reflux disease)   . Colon polyps 05/28/2011  . Insomnia 05/28/2011  . Cancer (HCC)     face, arms, legs, and ear, SCC  . Hemorrhoids 04/29/2011  . Lipoma 04/21/2012    On back near left shoulder blade and above right hip  . Sun-damaged skin 04/21/2012  . Laceration of skin of scalp 09/05/2012    right  . Skin cancer     face, arms, legs, and ear skin cancer Followed with surgeon who has retired per patient, has had 2 Mohs procedures on his right ear and 2 lesions excised, one form each leg.   Marland Kitchen Urinary frequency 04/30/2011  . Back pain 03/15/2013  . ED (erectile dysfunction) 08/22/2013  . Elevated WBCs 04/10/2014  . Diverticulosis of colon without hemorrhage 12/18/2014    Past Surgical History  Procedure Laterality Date  . Remove squamous cell      b/l legs  . Mohs surgery  2010    right ear, twice    Family History  Problem Relation Age of Onset  . COPD Mother   . Emphysema Mother   . Stroke Mother     X 2  . Heart disease Mother     CABG in her 75s  . Hyperlipidemia Mother   . Hypertension Mother   . Diabetes Mother     type 2  . Cancer Father      melanoma  . Stroke Father     37  . Hypertension Father   . Alcohol abuse Father   . Heart disease Father   . Diabetes Maternal Grandmother     type 2  . Heart disease Maternal Grandmother     CHF  . Heart attack Maternal Grandfather   . Heart disease Paternal Grandmother   . Heart disease Paternal Grandfather   . Colon cancer Neg Hx   . Colon polyps Neg Hx     Social History   Social History  . Marital Status: Married    Spouse Name: N/A  . Number of Children: 2  . Years of Education: N/A   Occupational History  . Landscape    Social History Main Topics  . Smoking status: Never Smoker   . Smokeless tobacco: Never Used  . Alcohol Use: 0.0 oz/week    0 Standard drinks or equivalent per week     Comment: twice monthly  . Drug Use: No  . Sexual Activity: Yes   Other Topics Concern  . Not on file   Social History Narrative    Outpatient Prescriptions Prior to  Visit  Medication Sig Dispense Refill  . aspirin 81 MG tablet Take 81 mg by mouth daily.    Marland Kitchen buPROPion (WELLBUTRIN XL) 300 MG 24 hr tablet Take 1 tablet (300 mg total) by mouth daily. 90 tablet 3  . diclofenac (VOLTAREN) 75 MG EC tablet TAKE ONE TABLET BY MOUTH TWICE DAILY 30 tablet 0  . FLUoxetine (PROZAC) 20 MG capsule Take 1 capsule (20 mg total) by mouth daily. 90 capsule 3  . fluticasone (FLONASE) 50 MCG/ACT nasal spray Place 2 sprays into both nostrils every morning. 16 g 6  . HYDROcodone-acetaminophen (NORCO) 5-325 MG tablet Take 1 tablet by mouth every 6 (six) hours as needed for moderate pain. 30 tablet 0  . mupirocin ointment (BACTROBAN) 2 % APPLY TO NARES EVERY NIGHT AT BEDTIME AS NEEDED FOR LESIONS 22 g 1  . ranitidine (ZANTAC) 300 MG tablet Take 1 tablet (300 mg total) by mouth at bedtime. 90 tablet 3  . ciprofloxacin (CIPRO) 500 MG tablet Take 1 tablet (500 mg total) by mouth 2 (two) times daily. 20 tablet 0   No facility-administered medications prior to visit.    Allergies  Allergen  Reactions  . Crestor [Rosuvastatin]     myalgias    Review of Systems  Constitutional: Negative for fever and malaise/fatigue.  HENT: Negative for congestion.   Eyes: Negative for blurred vision.  Respiratory: Negative for shortness of breath.   Cardiovascular: Negative for chest pain, palpitations and leg swelling.  Gastrointestinal: Negative for nausea, abdominal pain and blood in stool.  Genitourinary: Negative for dysuria and frequency.  Musculoskeletal: Positive for back pain and neck pain. Negative for falls.  Skin: Negative for rash.  Neurological: Negative for dizziness, loss of consciousness and headaches.  Endo/Heme/Allergies: Negative for environmental allergies.  Psychiatric/Behavioral: Negative for depression. The patient is not nervous/anxious.        Objective:    Physical Exam  Constitutional: He is oriented to person, place, and time. He appears well-developed and well-nourished. No distress.  HENT:  Head: Normocephalic and atraumatic.  Nose: Nose normal.  Eyes: Right eye exhibits no discharge. Left eye exhibits no discharge.  Neck: Normal range of motion. Neck supple.  Cardiovascular: Normal rate and regular rhythm.   No murmur heard. Pulmonary/Chest: Effort normal and breath sounds normal.  Abdominal: Soft. Bowel sounds are normal. There is no tenderness.  Musculoskeletal: He exhibits no edema.  Neurological: He is alert and oriented to person, place, and time.  Skin: Skin is warm and dry.  Psychiatric: He has a normal mood and affect.  Nursing note and vitals reviewed.   BP 124/83 mmHg  Pulse 58  Temp(Src) 97.9 F (36.6 C) (Oral)  Ht 5\' 8"  (1.727 m)  Wt 166 lb 2 oz (75.354 kg)  BMI 25.27 kg/m2  SpO2 100% Wt Readings from Last 3 Encounters:  05/29/15 166 lb 2 oz (75.354 kg)  04/13/15 162 lb 6.4 oz (73.664 kg)  12/05/14 165 lb 3.2 oz (74.934 kg)     Lab Results  Component Value Date   WBC 13.0* 04/13/2015   HGB 16.5 04/13/2015   HCT 49.3  04/13/2015   PLT 276.0 04/13/2015   GLUCOSE 97 09/02/2014   CHOL 210* 09/02/2014   TRIG 110.0 09/02/2014   HDL 36.20* 09/02/2014   LDLDIRECT 130.2 04/04/2014   LDLCALC 152* 09/02/2014   ALT 18 09/02/2014   AST 17 09/02/2014   NA 138 09/02/2014   K 3.9 09/02/2014   CL 104 09/02/2014   CREATININE  0.92 09/02/2014   BUN 15 09/02/2014   CO2 29 09/02/2014   TSH 1.57 09/02/2014   PSA 0.48 04/13/2015    Lab Results  Component Value Date   TSH 1.57 09/02/2014   Lab Results  Component Value Date   WBC 13.0* 04/13/2015   HGB 16.5 04/13/2015   HCT 49.3 04/13/2015   MCV 93.1 04/13/2015   PLT 276.0 04/13/2015   Lab Results  Component Value Date   NA 138 09/02/2014   K 3.9 09/02/2014   CO2 29 09/02/2014   GLUCOSE 97 09/02/2014   BUN 15 09/02/2014   CREATININE 0.92 09/02/2014   BILITOT 0.5 09/02/2014   ALKPHOS 84 09/02/2014   AST 17 09/02/2014   ALT 18 09/02/2014   PROT 7.3 09/02/2014   ALBUMIN 4.6 09/02/2014   CALCIUM 9.5 09/02/2014   GFR 92.70 09/02/2014   Lab Results  Component Value Date   CHOL 210* 09/02/2014   Lab Results  Component Value Date   HDL 36.20* 09/02/2014   Lab Results  Component Value Date   LDLCALC 152* 09/02/2014   Lab Results  Component Value Date   TRIG 110.0 09/02/2014   Lab Results  Component Value Date   CHOLHDL 6 09/02/2014   No results found for: HGBA1C     Assessment & Plan:   Problem List Items Addressed This Visit    Back pain    Encouraged moist heat and gentle stretching as tolerated. May try NSAIDs and prescription meds as directed and report if symptoms worsen or seek immediate care      Cold intolerance   Relevant Orders   CBC   TSH   Elevated WBCs - Primary    Mild, stable, will continue to monitor      Relevant Orders   CBC   TSH   Reflux    Avoid offending foods, start probiotics. Do not eat large meals in late evening and consider raising head of bed.       Skin cancer    Continues to have more  lesions develop will continue to follow closely with dermatology       Other Visit Diagnoses    Need for Tdap vaccination        Relevant Orders    Tdap vaccine greater than or equal to 7yo IM (Completed)       I have discontinued Mr. Eilts's ciprofloxacin. I am also having him maintain his aspirin, buPROPion, FLUoxetine, ranitidine, mupirocin ointment, HYDROcodone-acetaminophen, fluticasone, and diclofenac.  No orders of the defined types were placed in this encounter.     Penni Homans, MD

## 2015-05-29 NOTE — Patient Instructions (Signed)
Encouraged increased hydration and fiber in diet. Daily probiotics. If bowels not moving can use MOM 2 tbls po in 4 oz of warm prune juice by mouth every 2-3 days. If no results then repeat in 4 hours with  Dulcolax suppository pr, may repeat again in 4 more hours as needed. Seek care if symptoms worsen. Consider daily Miralax and/or Dulcolax if symptoms persist.    Constipation, Adult Constipation is when a person has fewer than three bowel movements a week, has difficulty having a bowel movement, or has stools that are dry, hard, or larger than normal. As people grow older, constipation is more common. A low-fiber diet, not taking in enough fluids, and taking certain medicines may make constipation worse.  CAUSES   Certain medicines, such as antidepressants, pain medicine, iron supplements, antacids, and water pills.   Certain diseases, such as diabetes, irritable bowel syndrome (IBS), thyroid disease, or depression.   Not drinking enough water.   Not eating enough fiber-rich foods.   Stress or travel.   Lack of physical activity or exercise.   Ignoring the urge to have a bowel movement.   Using laxatives too much.  SIGNS AND SYMPTOMS   Having fewer than three bowel movements a week.   Straining to have a bowel movement.   Having stools that are hard, dry, or larger than normal.   Feeling full or bloated.   Pain in the lower abdomen.   Not feeling relief after having a bowel movement.  DIAGNOSIS  Your health care provider will take a medical history and perform a physical exam. Further testing may be done for severe constipation. Some tests may include:  A barium enema X-ray to examine your rectum, colon, and, sometimes, your small intestine.   A sigmoidoscopy to examine your lower colon.   A colonoscopy to examine your entire colon. TREATMENT  Treatment will depend on the severity of your constipation and what is causing it. Some dietary treatments  include drinking more fluids and eating more fiber-rich foods. Lifestyle treatments may include regular exercise. If these diet and lifestyle recommendations do not help, your health care provider may recommend taking over-the-counter laxative medicines to help you have bowel movements. Prescription medicines may be prescribed if over-the-counter medicines do not work.  HOME CARE INSTRUCTIONS   Eat foods that have a lot of fiber, such as fruits, vegetables, whole grains, and beans.  Limit foods high in fat and processed sugars, such as french fries, hamburgers, cookies, candies, and soda.   A fiber supplement may be added to your diet if you cannot get enough fiber from foods.   Drink enough fluids to keep your urine clear or pale yellow.   Exercise regularly or as directed by your health care provider.   Go to the restroom when you have the urge to go. Do not hold it.   Only take over-the-counter or prescription medicines as directed by your health care provider. Do not take other medicines for constipation without talking to your health care provider first.  [place] IF:   You have bright red blood in your stool.   Your constipation lasts for more than 4 days or gets worse.   You have abdominal or rectal pain.   You have thin, pencil-like stools.   You have unexplained weight loss. MAKE SURE YOU:   Understand these instructions.  Will watch your condition.  Will get help right away if you are not doing well or get worse.  This information is not intended to replace advice given to you by your health care provider. Make sure you discuss any questions you have with your health care provider.   Document Released: 12/08/2003 Document Revised: 04/01/2014 Document Reviewed: 12/21/2012 Elsevier Interactive Patient Education [employer].

## 2015-06-04 DIAGNOSIS — R6889 Other general symptoms and signs: Secondary | ICD-10-CM | POA: Insufficient documentation

## 2015-06-04 NOTE — Assessment & Plan Note (Signed)
Continues to have more lesions develop will continue to follow closely with dermatology

## 2015-06-04 NOTE — Assessment & Plan Note (Signed)
Mild, stable, will continue to monitor

## 2015-06-04 NOTE — Assessment & Plan Note (Signed)
Encouraged moist heat and gentle stretching as tolerated. May try NSAIDs and prescription meds as directed and report if symptoms worsen or seek immediate care 

## 2015-06-04 NOTE — Assessment & Plan Note (Signed)
Avoid offending foods, start probiotics. Do not eat large meals in late evening and consider raising head of bed.  

## 2015-06-10 ENCOUNTER — Other Ambulatory Visit: Payer: Self-pay | Admitting: Medical

## 2015-06-19 ENCOUNTER — Encounter: Payer: Self-pay | Admitting: Medical

## 2015-06-19 ENCOUNTER — Ambulatory Visit (INDEPENDENT_AMBULATORY_CARE_PROVIDER_SITE_OTHER): Payer: BLUE CROSS/BLUE SHIELD | Admitting: Medical

## 2015-06-19 ENCOUNTER — Ambulatory Visit (HOSPITAL_BASED_OUTPATIENT_CLINIC_OR_DEPARTMENT_OTHER)
Admission: RE | Admit: 2015-06-19 | Discharge: 2015-06-19 | Disposition: A | Discharge status: Home / Self Care | Payer: BLUE CROSS/BLUE SHIELD | Source: Ambulatory Visit | Attending: Medical | Admitting: Medical

## 2015-06-19 VITALS — BP 104/68 | HR 78 | Temp 98.0°F | Ht 68.0 in | Wt 166.4 lb

## 2015-06-19 DIAGNOSIS — M79643 Pain in unspecified hand: Secondary | ICD-10-CM

## 2015-06-19 DIAGNOSIS — M79641 Pain in right hand: Secondary | ICD-10-CM | POA: Insufficient documentation

## 2015-06-19 DIAGNOSIS — M19041 Primary osteoarthritis, right hand: Secondary | ICD-10-CM | POA: Diagnosis not present

## 2015-06-19 DIAGNOSIS — M79642 Pain in left hand: Secondary | ICD-10-CM | POA: Diagnosis not present

## 2015-06-19 MED ORDER — CELECOXIB 200 MG PO CAPS: 200.0000 mg | ORAL_CAPSULE | Freq: Two times a day (BID) | ORAL | Status: DC

## 2015-06-19 MED ORDER — PREDNISONE 10 MG PO TABS: ORAL_TABLET | ORAL | Status: DC

## 2015-06-19 MED FILL — predniSONE 10 MG TABS: 10 | 5 days supply | Qty: 15 | Fill #0

## 2015-06-19 NOTE — Patient Instructions (Addendum)
For you hand pain bilateral will get xray both side.  Your arthritis panel was negative in past. This may need to be repeated in future or may need to refer to rheumatologist.  For the short term will rx tapered prednisone. And will get uric acid. This was not done on your RA panel. Stop diclofenac while on the prednisone.  Follow up in 2 weeks or as needed.

## 2015-06-19 NOTE — Progress Notes (Signed)
Pre visit review using our clinic review tool, if applicable. No additional management support is needed unless otherwise documented below in the visit note. 

## 2015-06-19 NOTE — Progress Notes (Signed)
Subjective:    Patient ID: Aaron Dyer, male    DOB: [birth year], 50 y.o.   MRN: [id number]  HPI  Pt in for both hands hurting. Rt hand is worse. Left hand pain as well (hands have been painful for about 1-2 months). Pain present all day. Hand feel stiff. Pt is on diclofenac. Does not help his hand pain.  Pt had not had any xrays of his hands. Prior arthitis panel study was negative.  Most of time rt hand feels quite stiff. Lt hand hurts as well but not as much.    Review of Systems  Constitutional: Negative for fever, chills and fatigue.  Respiratory: Negative for cough, chest tightness, shortness of breath and wheezing.   Cardiovascular: Negative for chest pain and palpitations.  Gastrointestinal: Negative for abdominal pain.  Musculoskeletal: Negative for back pain.       Hand pain.  Skin: Negative for color change and rash.  Neurological: Negative for dizziness and headaches.  Hematological: Negative for adenopathy. Does not bruise/bleed easily.  Psychiatric/Behavioral: Negative for confusion and agitation.    Past Medical History  Diagnosis Date  . Chicken pox as a child  . Mumps as a child  . Anxiety and depression   . Hyperlipidemia   . Rectal bleeding 04/29/2011  . Urinary hesitancy 04/30/2011  . Hematuria 04/30/2011  . IBS (irritable bowel syndrome)   . Depression   . GERD (gastroesophageal reflux disease)   . Colon polyps 05/28/2011  . Insomnia 05/28/2011  . Cancer (HCC)     face, arms, legs, and ear, SCC  . Hemorrhoids 04/29/2011  . Lipoma 04/21/2012    On back near left shoulder blade and above right hip  . Sun-damaged skin 04/21/2012  . Laceration of skin of scalp 09/05/2012    right  . Skin cancer     face, arms, legs, and ear skin cancer Followed with surgeon who has retired per patient, has had 2 Mohs procedures on his right ear and 2 lesions excised, one form each leg.   Marland Kitchen Urinary frequency 04/30/2011  . Back pain 03/15/2013  . ED (erectile dysfunction)  08/22/2013  . Elevated WBCs 04/10/2014  . Diverticulosis of colon without hemorrhage 12/18/2014    Social History   Social History  . Marital Status: Married    Spouse Name: N/A  . Number of Children: 2  . Years of Education: N/A   Occupational History  . Landscape    Social History Main Topics  . Smoking status: Never Smoker   . Smokeless tobacco: Never Used  . Alcohol Use: 0.0 oz/week    0 Standard drinks or equivalent per week     Comment: twice monthly  . Drug Use: No  . Sexual Activity: Yes   Other Topics Concern  . Not on file   Social History Narrative    Past Surgical History  Procedure Laterality Date  . Remove squamous cell      b/l legs  . Mohs surgery  2010    right ear, twice    Family History  Problem Relation Age of Onset  . COPD Mother   . Emphysema Mother   . Stroke Mother     X 2  . Heart disease Mother     CABG in her 6s  . Hyperlipidemia Mother   . Hypertension Mother   . Diabetes Mother     type 2  . Cancer Father     melanoma  . Stroke Father  1992  . Hypertension Father   . Alcohol abuse Father   . Heart disease Father   . Diabetes Maternal Grandmother     type 2  . Heart disease Maternal Grandmother     CHF  . Heart attack Maternal Grandfather   . Heart disease Paternal Grandmother   . Heart disease Paternal Grandfather   . Colon cancer Neg Hx   . Colon polyps Neg Hx     Allergies  Allergen Reactions  . Crestor [Rosuvastatin]     myalgias    Current Outpatient Prescriptions on File Prior to Visit  Medication Sig Dispense Refill  . aspirin 81 MG tablet Take 81 mg by mouth daily.    Marland Kitchen buPROPion (WELLBUTRIN XL) 300 MG 24 hr tablet Take 1 tablet (300 mg total) by mouth daily. 90 tablet 3  . diclofenac (VOLTAREN) 75 MG EC tablet TAKE ONE TABLET BY MOUTH TWICE DAILY 30 tablet 0  . FLUoxetine (PROZAC) 20 MG capsule Take 1 capsule (20 mg total) by mouth daily. 90 capsule 3  . fluticasone (FLONASE) 50 MCG/ACT nasal spray  Place 2 sprays into both nostrils every morning. 16 g 6  . mupirocin ointment (BACTROBAN) 2 % APPLY TO NARES EVERY NIGHT AT BEDTIME AS NEEDED FOR LESIONS 22 g 1  . ranitidine (ZANTAC) 300 MG tablet Take 1 tablet (300 mg total) by mouth at bedtime. 90 tablet 3   No current facility-administered medications on file prior to visit.    BP 104/68 mmHg  Pulse 78  Temp(Src) 98 F (36.7 C) (Oral)  Ht 5\' 8"  (1.727 m)  Wt 166 lb 6.4 oz (75.479 kg)  BMI 25.31 kg/m2  SpO2 98%       Objective:   Physical Exam  General- No acute distress. Pleasant patient. Neck- Full range of motion, no jvd Lungs- Clear, even and unlabored. Heart- regular rate and rhythm. Neurologic- CNII- XII grossly intact.  Bilateral hand- pt had diffuse mild thickness to mcp and pip joints. 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     Assessment & Plan:  For you hand pain bilateral will get xray both side.  Your arthritis panel was negative in past. This may need to be repeated in future or may need to refer to rheumatologist.  For the short term will rx tapered prednisone. And will get uric acid. This was not done on your RA panel. Stop diclofenac while on the prednisone.  Follow up in 2 weeks or as needed.

## 2015-06-20 LAB — URIC ACID: URIC ACID, SERUM: 4.7 mg/dL (ref 4.0–7.8)

## 2015-07-07 ENCOUNTER — Other Ambulatory Visit: Payer: Self-pay | Admitting: Medical

## 2015-08-10 ENCOUNTER — Other Ambulatory Visit: Payer: Self-pay | Admitting: Family Medicine

## 2015-08-10 ENCOUNTER — Other Ambulatory Visit: Payer: Self-pay | Admitting: Medical

## 2015-08-18 DIAGNOSIS — C4330 Malignant melanoma of unspecified part of face: Secondary | ICD-10-CM | POA: Diagnosis not present

## 2015-08-18 DIAGNOSIS — D0359 Melanoma in situ of other part of trunk: Secondary | ICD-10-CM | POA: Diagnosis not present

## 2015-08-29 ENCOUNTER — Encounter: Payer: Self-pay | Admitting: Family Medicine

## 2015-08-29 ENCOUNTER — Ambulatory Visit (INDEPENDENT_AMBULATORY_CARE_PROVIDER_SITE_OTHER): Payer: BLUE CROSS/BLUE SHIELD | Admitting: Family Medicine

## 2015-08-29 ENCOUNTER — Ambulatory Visit (HOSPITAL_BASED_OUTPATIENT_CLINIC_OR_DEPARTMENT_OTHER)
Admission: RE | Admit: 2015-08-29 | Discharge: 2015-08-29 | Disposition: A | Discharge status: Home / Self Care | Payer: BLUE CROSS/BLUE SHIELD | Source: Ambulatory Visit | Attending: Family Medicine | Admitting: Family Medicine

## 2015-08-29 VITALS — BP 112/84 | HR 67 | Temp 98.2°F | Ht 68.0 in | Wt 164.5 lb

## 2015-08-29 DIAGNOSIS — M5136 Other intervertebral disc degeneration, lumbar region: Secondary | ICD-10-CM | POA: Insufficient documentation

## 2015-08-29 DIAGNOSIS — F32A Depression, unspecified: Secondary | ICD-10-CM

## 2015-08-29 DIAGNOSIS — M542 Cervicalgia: Secondary | ICD-10-CM | POA: Diagnosis not present

## 2015-08-29 DIAGNOSIS — R319 Hematuria, unspecified: Secondary | ICD-10-CM

## 2015-08-29 DIAGNOSIS — T148 Other injury of unspecified body region: Secondary | ICD-10-CM

## 2015-08-29 DIAGNOSIS — F329 Major depressive disorder, single episode, unspecified: Secondary | ICD-10-CM

## 2015-08-29 DIAGNOSIS — M50322 Other cervical disc degeneration at C5-C6 level: Secondary | ICD-10-CM | POA: Diagnosis not present

## 2015-08-29 DIAGNOSIS — M4802 Spinal stenosis, cervical region: Secondary | ICD-10-CM | POA: Diagnosis not present

## 2015-08-29 DIAGNOSIS — M545 Low back pain: Secondary | ICD-10-CM | POA: Diagnosis not present

## 2015-08-29 DIAGNOSIS — IMO0001 Reserved for inherently not codable concepts without codable children: Secondary | ICD-10-CM

## 2015-08-29 DIAGNOSIS — D72829 Elevated white blood cell count, unspecified: Secondary | ICD-10-CM | POA: Diagnosis not present

## 2015-08-29 DIAGNOSIS — F418 Other specified anxiety disorders: Secondary | ICD-10-CM | POA: Diagnosis not present

## 2015-08-29 DIAGNOSIS — W57XXXA Bitten or stung by nonvenomous insect and other nonvenomous arthropods, initial encounter: Secondary | ICD-10-CM

## 2015-08-29 DIAGNOSIS — S0101XA Laceration without foreign body of scalp, initial encounter: Secondary | ICD-10-CM | POA: Diagnosis not present

## 2015-08-29 DIAGNOSIS — K649 Unspecified hemorrhoids: Secondary | ICD-10-CM | POA: Diagnosis not present

## 2015-08-29 DIAGNOSIS — K625 Hemorrhage of anus and rectum: Secondary | ICD-10-CM

## 2015-08-29 DIAGNOSIS — E785 Hyperlipidemia, unspecified: Secondary | ICD-10-CM | POA: Diagnosis not present

## 2015-08-29 DIAGNOSIS — K5900 Constipation, unspecified: Secondary | ICD-10-CM

## 2015-08-29 DIAGNOSIS — K219 Gastro-esophageal reflux disease without esophagitis: Secondary | ICD-10-CM

## 2015-08-29 MED ORDER — MUPIROCIN 2 % EX OINT: TOPICAL_OINTMENT | CUTANEOUS | Status: DC

## 2015-08-29 MED ORDER — FLUTICASONE PROPIONATE 50 MCG/ACT NA SUSP: 2.0000 | Freq: Every morning | NASAL | Status: DC

## 2015-08-29 MED ORDER — BUPROPION HCL ER (XL) 300 MG PO TB24: 300.0000 mg | ORAL_TABLET | Freq: Every day | ORAL | Status: DC

## 2015-08-29 MED ORDER — FLUOXETINE HCL 20 MG PO CAPS: 20.0000 mg | ORAL_CAPSULE | Freq: Every day | ORAL | Status: DC

## 2015-08-29 MED ORDER — RANITIDINE HCL 300 MG PO TABS: 300.0000 mg | ORAL_TABLET | Freq: Every day | ORAL | Status: DC

## 2015-08-29 MED ORDER — DICLOFENAC SODIUM 75 MG PO TBEC: 75.0000 mg | DELAYED_RELEASE_TABLET | Freq: Two times a day (BID) | ORAL | Status: DC

## 2015-08-29 NOTE — Progress Notes (Signed)
Pre visit review using our clinic review tool, if applicable. No additional management support is needed unless otherwise documented below in the visit note. 

## 2015-08-29 NOTE — Patient Instructions (Signed)
Tick Bite Information Ticks are insects that attach themselves to the skin and draw blood for food. There are various types of ticks. Common types include wood ticks and deer ticks. Most ticks live in shrubs and grassy areas. Ticks can climb onto your body when you make contact with leaves or grass where the tick is waiting. The most common places on the body for ticks to attach themselves are the scalp, neck, armpits, waist, and groin. Most tick bites are harmless, but sometimes ticks carry germs that cause diseases. These germs can be spread to a person during the tick's feeding process. The chance of a disease spreading through a tick bite depends on:   The type of tick.  Time of year.   How long the tick is attached.   Geographic location.  HOW CAN YOU PREVENT TICK BITES? Take these steps to help prevent tick bites when you are outdoors:  Wear protective clothing. Long sleeves and long pants are best.   Wear white clothes so you can see ticks more easily.  Tuck your pant legs into your socks.   If walking on a trail, stay in the middle of the trail to avoid brushing against bushes.  Avoid walking through areas with long grass.  Put insect repellent on all exposed skin and along boot tops, pant legs, and sleeve cuffs.   Check clothing, hair, and skin repeatedly and before going inside.   Brush off any ticks that are not attached.  Take a shower or bath as soon as possible after being outdoors.  WHAT IS THE PROPER WAY TO REMOVE A TICK? Ticks should be removed as soon as possible to help prevent diseases caused by tick bites. 1. If latex gloves are available, put them on before trying to remove a tick.  2. Using fine-point tweezers, grasp the tick as close to the skin as possible. You may also use curved forceps or a tick removal tool. Grasp the tick as close to its head as possible. Avoid grasping the tick on its body. 3. Pull gently with steady upward pressure until  the tick lets go. Do not twist the tick or jerk it suddenly. This may break off the tick's head or mouth parts. 4. Do not squeeze or crush the tick's body. This could force disease-carrying fluids from the tick into your body.  5. After the tick is removed, wash the bite area and your hands with soap and water or other disinfectant such as alcohol. 6. Apply a small amount of antiseptic cream or ointment to the bite site.  7. Wash and disinfect any instruments that were used.  Do not try to remove a tick by applying a hot match, petroleum jelly, or fingernail polish to the tick. These methods do not work and may increase the chances of disease being spread from the tick bite.  WHEN SHOULD YOU SEEK MEDICAL CARE? Contact your health care provider if you are unable to remove a tick from your skin or if a part of the tick breaks off and is stuck in the skin.  After a tick bite, you need to be aware of signs and symptoms that could be related to diseases spread by ticks. Contact your health care provider if you develop any of the following in the days or weeks after the tick bite:  Unexplained fever.  Rash. A circular rash that appears days or weeks after the tick bite may indicate the possibility of Lyme disease. The rash may resemble   a target with a bull's-eye and may occur at a different part of your body than the tick bite.  Redness and swelling in the area of the tick bite.   Tender, swollen lymph glands.   Diarrhea.   Weight loss.   Cough.   Fatigue.   Muscle, joint, or bone pain.   Abdominal pain.   Headache.   Lethargy or a change in your level of consciousness.  Difficulty walking or moving your legs.   Numbness in the legs.   Paralysis.  Shortness of breath.   Confusion.   Repeated vomiting.    This information is not intended to replace advice given to you by your health care provider. Make sure you discuss any questions you have with your health  care provider.   Document Released: 03/08/2000 Document Revised: 04/01/2014 Document Reviewed: 08/19/2012 Elsevier Interactive Patient Education 2016 [employer]  

## 2015-08-31 ENCOUNTER — Other Ambulatory Visit: Payer: Self-pay | Admitting: Family Medicine

## 2015-08-31 DIAGNOSIS — M542 Cervicalgia: Secondary | ICD-10-CM

## 2015-09-07 ENCOUNTER — Other Ambulatory Visit (INDEPENDENT_AMBULATORY_CARE_PROVIDER_SITE_OTHER): Payer: Medicare Other

## 2015-09-07 DIAGNOSIS — F32A Depression, unspecified: Secondary | ICD-10-CM

## 2015-09-07 DIAGNOSIS — IMO0001 Reserved for inherently not codable concepts without codable children: Secondary | ICD-10-CM

## 2015-09-07 DIAGNOSIS — W57XXXA Bitten or stung by nonvenomous insect and other nonvenomous arthropods, initial encounter: Secondary | ICD-10-CM

## 2015-09-07 DIAGNOSIS — F329 Major depressive disorder, single episode, unspecified: Secondary | ICD-10-CM

## 2015-09-07 DIAGNOSIS — E785 Hyperlipidemia, unspecified: Secondary | ICD-10-CM

## 2015-09-07 DIAGNOSIS — D72829 Elevated white blood cell count, unspecified: Secondary | ICD-10-CM

## 2015-09-07 DIAGNOSIS — F418 Other specified anxiety disorders: Secondary | ICD-10-CM

## 2015-09-07 DIAGNOSIS — T148 Other injury of unspecified body region: Secondary | ICD-10-CM

## 2015-09-07 DIAGNOSIS — R319 Hematuria, unspecified: Secondary | ICD-10-CM

## 2015-09-07 DIAGNOSIS — S0101XA Laceration without foreign body of scalp, initial encounter: Secondary | ICD-10-CM

## 2015-09-07 DIAGNOSIS — M545 Low back pain: Secondary | ICD-10-CM

## 2015-09-07 DIAGNOSIS — K649 Unspecified hemorrhoids: Secondary | ICD-10-CM

## 2015-09-07 DIAGNOSIS — K219 Gastro-esophageal reflux disease without esophagitis: Secondary | ICD-10-CM

## 2015-09-07 LAB — COMPREHENSIVE METABOLIC PANEL
ALBUMIN: 4.6 g/dL (ref 3.5–5.2)
ALK PHOS: 80 U/L (ref 39–117)
ALT: 25 U/L (ref 0–53)
AST: 17 U/L (ref 0–37)
BUN: 16 mg/dL (ref 6–23)
CHLORIDE: 103 meq/L (ref 96–112)
CO2: 29 mEq/L (ref 19–32)
CREATININE: 0.98 mg/dL (ref 0.40–1.50)
Calcium: 9.5 mg/dL (ref 8.4–10.5)
GFR: 85.83 mL/min (ref >60.00)
GLUCOSE: 96 mg/dL (ref 70–99)
POTASSIUM: 4.2 meq/L (ref 3.5–5.1)
SODIUM: 136 meq/L (ref 135–145)
TOTAL PROTEIN: 7.3 g/dL (ref 6.0–8.3)
Total Bilirubin: 0.5 mg/dL (ref 0.2–1.2)

## 2015-09-07 LAB — LIPID PANEL
CHOL/HDL RATIO: 7
Cholesterol: 232 mg/dL — ABNORMAL HIGH (ref 0–200)
HDL: 34 mg/dL — AB (ref >39.00)
LDL Cholesterol: 173 mg/dL — ABNORMAL HIGH (ref 0–99)
NONHDL: 197.69
Triglycerides: 122 mg/dL (ref 0.0–149.0)
VLDL: 24.4 mg/dL (ref 0.0–40.0)

## 2015-09-07 LAB — URINALYSIS, ROUTINE W REFLEX MICROSCOPIC
Bilirubin Urine: NEGATIVE
Ketones, ur: NEGATIVE
Leukocytes, UA: NEGATIVE
Nitrite: NEGATIVE
Specific Gravity, Urine: 1.02
Total Protein, Urine: NEGATIVE
Urine Glucose: NEGATIVE
Urobilinogen, UA: 0.2
pH: 5.5 (ref 5.0–8.0)

## 2015-09-07 LAB — CBC WITH DIFFERENTIAL/PLATELET
Basophils Absolute: 0 10*3/uL (ref 0.0–0.1)
Basophils Relative: 0.4 % (ref 0.0–3.0)
EOS ABS: 0.2 10*3/uL (ref 0.0–0.7)
Eosinophils Relative: 1.8 % (ref 0.0–5.0)
HCT: 44.5 % (ref 39.0–52.0)
HEMOGLOBIN: 15.2 g/dL (ref 13.0–17.0)
LYMPHS ABS: 3.1 10*3/uL (ref 0.7–4.0)
Lymphocytes Relative: 32.5 % (ref 12.0–46.0)
MCHC: 34 g/dL (ref 30.0–36.0)
MCV: 93.3 fl (ref 78.0–100.0)
MONO ABS: 0.5 10*3/uL (ref 0.1–1.0)
Monocytes Relative: 5.2 % (ref 3.0–12.0)
NEUTROS PCT: 60.1 % (ref 43.0–77.0)
Neutro Abs: 5.7 10*3/uL (ref 1.4–7.7)
Platelets: 247 10*3/uL (ref 150.0–400.0)
RBC: 4.77 Mil/uL (ref 4.22–5.81)
RDW: 14.3 % (ref 11.5–15.5)
WBC: 9.6 10*3/uL (ref 4.0–10.5)

## 2015-09-07 LAB — TSH: TSH: 1.1 u[IU]/mL (ref 0.35–4.50)

## 2015-09-08 ENCOUNTER — Encounter: Payer: Self-pay | Admitting: Family Medicine

## 2015-09-08 DIAGNOSIS — K5900 Constipation, unspecified: Secondary | ICD-10-CM | POA: Insufficient documentation

## 2015-09-08 DIAGNOSIS — W57XXXA Bitten or stung by nonvenomous insect and other nonvenomous arthropods, initial encounter: Secondary | ICD-10-CM | POA: Insufficient documentation

## 2015-09-08 LAB — URINE CULTURE
Colony Count: NO GROWTH
Organism ID, Bacteria: NO GROWTH

## 2015-09-08 LAB — LYME AB/WESTERN BLOT REFLEX: B burgdorferi Ab IgG+IgM: <0.9 Index (ref <0.90)

## 2015-09-08 NOTE — Progress Notes (Signed)
Patient ID: Aaron Dyer, male   DOB: 10/31/1965, 50 y.o.   MRN: [id number]   Subjective:    Patient ID: Aaron Dyer, male    DOB: [birth year], 50 y.o.   MRN: [id number]  Chief Complaint  Patient presents with  . Follow-up    fatigue    HPI Patient is in today for follow up accompanied by his wife. They note some recent ticks bites followed by rash, fever, headache and fatigue. He continues to have fatigue but the other symptoms have resolved. He did have some flank pain but that has resolved. Denies CP/palp/SOB/HA/congestion/fevers/GI or GU c/o. Taking meds as prescribed  Past Medical History  Diagnosis Date  . Chicken pox as a child  . Mumps as a child  . Anxiety and depression   . Hyperlipidemia   . Rectal bleeding 04/29/2011  . Urinary hesitancy 04/30/2011  . Hematuria 04/30/2011  . IBS (irritable bowel syndrome)   . Depression   . GERD (gastroesophageal reflux disease)   . Colon polyps 05/28/2011  . Insomnia 05/28/2011  . Cancer (HCC)     face, arms, legs, and ear, SCC  . Hemorrhoids 04/29/2011  . Lipoma 04/21/2012    On back near left shoulder blade and above right hip  . Sun-damaged skin 04/21/2012  . Laceration of skin of scalp 09/05/2012    right  . Skin cancer     face, arms, legs, and ear skin cancer Followed with surgeon who has retired per patient, has had 2 Mohs procedures on his right ear and 2 lesions excised, one form each leg.   Marland Kitchen Urinary frequency 04/30/2011  . Back pain 03/15/2013  . ED (erectile dysfunction) 08/22/2013  . Elevated WBCs 04/10/2014  . Diverticulosis of colon without hemorrhage 12/18/2014  . Constipation 09/08/2015    Past Surgical History  Procedure Laterality Date  . Remove squamous cell      b/l legs  . Mohs surgery  2010    right ear, twice    Family History  Problem Relation Age of Onset  . COPD Mother   . Emphysema Mother   . Stroke Mother     X 2  . Heart disease Mother     CABG in her 39s  . Hyperlipidemia Mother   .  Hypertension Mother   . Diabetes Mother     type 2  . Cancer Father     melanoma  . Stroke Father     15  . Hypertension Father   . Alcohol abuse Father   . Heart disease Father   . Diabetes Maternal Grandmother     type 2  . Heart disease Maternal Grandmother     CHF  . Heart attack Maternal Grandfather   . Heart disease Paternal Grandmother   . Heart disease Paternal Grandfather   . Colon cancer Neg Hx   . Colon polyps Neg Hx     Social History   Social History  . Marital Status: Married    Spouse Name: N/A  . Number of Children: 2  . Years of Education: N/A   Occupational History  . Landscape    Social History Main Topics  . Smoking status: Never Smoker   . Smokeless tobacco: Never Used  . Alcohol Use: 0.0 oz/week    0 Standard drinks or equivalent per week     Comment: twice monthly  . Drug Use: No  . Sexual Activity: Yes   Other Topics Concern  . Not on  file   Social History Narrative    Outpatient Prescriptions Prior to Visit  Medication Sig Dispense Refill  . aspirin 81 MG tablet Take 81 mg by mouth daily.    Marland Kitchen buPROPion (WELLBUTRIN XL) 300 MG 24 hr tablet Take 1 tablet (300 mg total) by mouth daily. 90 tablet 3  . diclofenac (VOLTAREN) 75 MG EC tablet TAKE ONE TABLET BY MOUTH TWICE DAILY (Patient taking differently: TAKE ONE TABLET BY MOUTH ONCE  DAILY) 30 tablet 0  . FLUoxetine (PROZAC) 20 MG capsule Take 1 capsule (20 mg total) by mouth daily. 90 capsule 3  . fluticasone (FLONASE) 50 MCG/ACT nasal spray Place 2 sprays into both nostrils every morning. 16 g 6  . mupirocin ointment (BACTROBAN) 2 % APPLY TO NARES EVERY NIGHT AT BEDTIME AS NEEDED FOR LESIONS 22 g 1  . ranitidine (ZANTAC) 300 MG tablet TAKE ONE TABLET BY MOUTH AT BEDTIME 90 tablet 0  . predniSONE (DELTASONE) 10 MG tablet 5 tab po day 1, 4 tab po day 2, 3 tab po day 3, 2 tab po day 4, 1 tab po day 5. 15 tablet 0   No facility-administered medications prior to visit.    Allergies    Allergen Reactions  . Crestor [Rosuvastatin]     myalgias    Review of Systems  Constitutional: Negative for fever and malaise/fatigue.  HENT: Negative for congestion.   Eyes: Negative for blurred vision.  Respiratory: Negative for shortness of breath.   Cardiovascular: Negative for chest pain, palpitations and leg swelling.  Gastrointestinal: Positive for abdominal pain. Negative for nausea and blood in stool.  Genitourinary: Negative for dysuria and frequency.  Musculoskeletal: Positive for back pain, joint pain and neck pain. Negative for falls.  Skin: Negative for rash.  Neurological: Negative for dizziness, loss of consciousness and headaches.  Endo/Heme/Allergies: Negative for environmental allergies.  Psychiatric/Behavioral: Negative for depression. The patient is not nervous/anxious.        Objective:    Physical Exam  Constitutional: He is oriented to person, place, and time. He appears well-developed and well-nourished. No distress.  HENT:  Head: Normocephalic and atraumatic.  Nose: Nose normal.  Eyes: Right eye exhibits no discharge. Left eye exhibits no discharge.  Neck: Normal range of motion. Neck supple.  Cardiovascular: Normal rate.   No murmur heard. Pulmonary/Chest: Effort normal and breath sounds normal.  Abdominal: Soft. Bowel sounds are normal. There is no tenderness.  Musculoskeletal: He exhibits no edema.  Neurological: He is alert and oriented to person, place, and time.  Skin: Skin is warm and dry.  Psychiatric: He has a normal mood and affect.  Nursing note and vitals reviewed.   BP 112/84 mmHg  Pulse 67  Temp(Src) 98.2 F (36.8 C) (Oral)  Ht 5' 8"  (1.727 m)  Wt 164 lb 8 oz (74.617 kg)  BMI 25.02 kg/m2  SpO2 96% Wt Readings from Last 3 Encounters:  08/29/15 164 lb 8 oz (74.617 kg)  06/19/15 166 lb 6.4 oz (75.479 kg)  05/29/15 166 lb 2 oz (75.354 kg)     Lab Results  Component Value Date   WBC 9.6 09/07/2015   HGB 15.2 09/07/2015    HCT 44.5 09/07/2015   PLT 247.0 09/07/2015   GLUCOSE 96 09/07/2015   CHOL 232* 09/07/2015   TRIG 122.0 09/07/2015   HDL 34.00* 09/07/2015   LDLDIRECT 130.2 04/04/2014   LDLCALC 173* 09/07/2015   ALT 25 09/07/2015   AST 17 09/07/2015   NA 136 09/07/2015  K 4.2 09/07/2015   CL 103 09/07/2015   CREATININE 0.98 09/07/2015   BUN 16 09/07/2015   CO2 29 09/07/2015   TSH 1.10 09/07/2015   PSA 0.48 04/13/2015    Lab Results  Component Value Date   TSH 1.10 09/07/2015   Lab Results  Component Value Date   WBC 9.6 09/07/2015   HGB 15.2 09/07/2015   HCT 44.5 09/07/2015   MCV 93.3 09/07/2015   PLT 247.0 09/07/2015   Lab Results  Component Value Date   NA 136 09/07/2015   K 4.2 09/07/2015   CO2 29 09/07/2015   GLUCOSE 96 09/07/2015   BUN 16 09/07/2015   CREATININE 0.98 09/07/2015   BILITOT 0.5 09/07/2015   ALKPHOS 80 09/07/2015   AST 17 09/07/2015   ALT 25 09/07/2015   PROT 7.3 09/07/2015   ALBUMIN 4.6 09/07/2015   CALCIUM 9.5 09/07/2015   GFR 85.83 09/07/2015   Lab Results  Component Value Date   CHOL 232* 09/07/2015   Lab Results  Component Value Date   HDL 34.00* 09/07/2015   Lab Results  Component Value Date   LDLCALC 173* 09/07/2015   Lab Results  Component Value Date   TRIG 122.0 09/07/2015   Lab Results  Component Value Date   CHOLHDL 7 09/07/2015   No results found for: HGBA1C     Assessment & Plan:   Problem List Items Addressed This Visit    Tick bite    Check RMSF and Lyme titer blood work.       Relevant Medications   ranitidine (ZANTAC) 300 MG tablet   mupirocin ointment (BACTROBAN) 2 %   fluticasone (FLONASE) 50 MCG/ACT nasal spray   FLUoxetine (PROZAC) 20 MG capsule   diclofenac (VOLTAREN) 75 MG EC tablet   buPROPion (WELLBUTRIN XL) 300 MG 24 hr tablet   Other Relevant Orders   Rocky mtn spotted fvr ab, IgG-blood   Lyme Ab/Western Blot Reflex   CBC w/Diff (Completed)   Comp Met (CMET) (Completed)   TSH (Completed)    Lipid panel (Completed)   Urinalysis   Urine culture   Reflux   Relevant Medications   ranitidine (ZANTAC) 300 MG tablet   mupirocin ointment (BACTROBAN) 2 %   fluticasone (FLONASE) 50 MCG/ACT nasal spray   FLUoxetine (PROZAC) 20 MG capsule   diclofenac (VOLTAREN) 75 MG EC tablet   buPROPion (WELLBUTRIN XL) 300 MG 24 hr tablet   Other Relevant Orders   Rocky mtn spotted fvr ab, IgG-blood   Lyme Ab/Western Blot Reflex   CBC w/Diff (Completed)   Comp Met (CMET) (Completed)   TSH (Completed)   Lipid panel (Completed)   Urinalysis   Urine culture   Neck pain, bilateral    Worsening and with radicular symptoms. Proceed with imaging.      Laceration of skin of scalp   Relevant Medications   ranitidine (ZANTAC) 300 MG tablet   mupirocin ointment (BACTROBAN) 2 %   fluticasone (FLONASE) 50 MCG/ACT nasal spray   FLUoxetine (PROZAC) 20 MG capsule   diclofenac (VOLTAREN) 75 MG EC tablet   buPROPion (WELLBUTRIN XL) 300 MG 24 hr tablet   Other Relevant Orders   Rocky mtn spotted fvr ab, IgG-blood   Lyme Ab/Western Blot Reflex   CBC w/Diff (Completed)   Comp Met (CMET) (Completed)   TSH (Completed)   Lipid panel (Completed)   Urinalysis   Urine culture   Hyperlipidemia    Encouraged heart healthy diet, increase exercise, avoid trans fats,  consider a krill oil cap daily      Relevant Medications   ranitidine (ZANTAC) 300 MG tablet   mupirocin ointment (BACTROBAN) 2 %   fluticasone (FLONASE) 50 MCG/ACT nasal spray   FLUoxetine (PROZAC) 20 MG capsule   diclofenac (VOLTAREN) 75 MG EC tablet   buPROPion (WELLBUTRIN XL) 300 MG 24 hr tablet   Other Relevant Orders   Rocky mtn spotted fvr ab, IgG-blood   Lyme Ab/Western Blot Reflex   CBC w/Diff (Completed)   Comp Met (CMET) (Completed)   TSH (Completed)   Lipid panel (Completed)   Urinalysis   Urine culture   Hemorrhoids   Relevant Medications   ranitidine (ZANTAC) 300 MG tablet   mupirocin ointment (BACTROBAN) 2 %    fluticasone (FLONASE) 50 MCG/ACT nasal spray   FLUoxetine (PROZAC) 20 MG capsule   diclofenac (VOLTAREN) 75 MG EC tablet   buPROPion (WELLBUTRIN XL) 300 MG 24 hr tablet   Other Relevant Orders   Rocky mtn spotted fvr ab, IgG-blood   Lyme Ab/Western Blot Reflex   CBC w/Diff (Completed)   Comp Met (CMET) (Completed)   TSH (Completed)   Lipid panel (Completed)   Urinalysis   Urine culture   Elevated WBCs   Relevant Medications   ranitidine (ZANTAC) 300 MG tablet   mupirocin ointment (BACTROBAN) 2 %   fluticasone (FLONASE) 50 MCG/ACT nasal spray   FLUoxetine (PROZAC) 20 MG capsule   diclofenac (VOLTAREN) 75 MG EC tablet   buPROPion (WELLBUTRIN XL) 300 MG 24 hr tablet   Other Relevant Orders   Rocky mtn spotted fvr ab, IgG-blood   Lyme Ab/Western Blot Reflex   CBC w/Diff (Completed)   Comp Met (CMET) (Completed)   TSH (Completed)   Lipid panel (Completed)   Urinalysis   Urine culture   Constipation    With some straining and blood noted on tissue at times. Encouraged increased hydration a fiber supplement twice daily, probiotics and Miralax daily or as needed. 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       Relevant Orders   Ambulatory referral to Gastroenterology   Back pain    Worsening neck and back pain with radicular pain in both arms and into legs at times. Will proceed with imaging and referral for further care once that is completed. Encouraged moist heat and gentle stretching as tolerated. May try NSAIDs and prescription meds as directed and report if symptoms worsen or seek immediate care and topical treatments      Relevant Medications   ranitidine (ZANTAC) 300 MG tablet   mupirocin ointment (BACTROBAN) 2 %   fluticasone (FLONASE) 50 MCG/ACT nasal spray   FLUoxetine (PROZAC) 20 MG capsule   diclofenac (VOLTAREN) 75 MG EC tablet   buPROPion (WELLBUTRIN XL) 300 MG 24 hr tablet   Other Relevant Orders   Rocky mtn spotted  fvr ab, IgG-blood   Lyme Ab/Western Blot Reflex   CBC w/Diff (Completed)   Comp Met (CMET) (Completed)   TSH (Completed)   Lipid panel (Completed)   Urinalysis   Urine culture   DG Lumbar Spine Complete (Completed)    Other Visit Diagnoses    Depression with anxiety    -  Primary    Relevant Medications    ranitidine (ZANTAC) 300 MG tablet    mupirocin ointment (BACTROBAN) 2 %    fluticasone (FLONASE) 50 MCG/ACT nasal spray    FLUoxetine (PROZAC) 20 MG capsule    diclofenac (VOLTAREN) 75 MG EC tablet  buPROPion (WELLBUTRIN XL) 300 MG 24 hr tablet    Other Relevant Orders    Rocky mtn spotted fvr ab, IgG-blood    Lyme Ab/Western Blot Reflex    CBC w/Diff (Completed)    Comp Met (CMET) (Completed)    TSH (Completed)    Lipid panel (Completed)    Urinalysis    Urine culture    Depression        Relevant Medications    ranitidine (ZANTAC) 300 MG tablet    mupirocin ointment (BACTROBAN) 2 %    fluticasone (FLONASE) 50 MCG/ACT nasal spray    FLUoxetine (PROZAC) 20 MG capsule    diclofenac (VOLTAREN) 75 MG EC tablet    buPROPion (WELLBUTRIN XL) 300 MG 24 hr tablet    Other Relevant Orders    Rocky mtn spotted fvr ab, IgG-blood    Lyme Ab/Western Blot Reflex    CBC w/Diff (Completed)    Comp Met (CMET) (Completed)    TSH (Completed)    Lipid panel (Completed)    Urinalysis    Urine culture    Bilateral neck pain        Relevant Orders    DG Cervical Spine Complete (Completed)    Hematuria        Relevant Orders    Urine culture    Rectal bleeding        Relevant Orders    Ambulatory referral to Gastroenterology       I have discontinued Mr. Salatino's predniSONE. I have also changed his ranitidine and diclofenac. Additionally, I am having him maintain his aspirin, mupirocin ointment, fluticasone, FLUoxetine, and buPROPion.  Meds ordered this encounter  Medications  . ranitidine (ZANTAC) 300 MG tablet    Sig: Take 1 tablet (300 mg total) by mouth at bedtime.     Dispense:  90 tablet    Refill:  1  . mupirocin ointment (BACTROBAN) 2 %    Sig: APPLY TO NARES EVERY NIGHT AT BEDTIME AS NEEDED FOR LESIONS    Dispense:  22 g    Refill:  1  . fluticasone (FLONASE) 50 MCG/ACT nasal spray    Sig: Place 2 sprays into both nostrils every morning.    Dispense:  16 g    Refill:  6  . FLUoxetine (PROZAC) 20 MG capsule    Sig: Take 1 capsule (20 mg total) by mouth daily.    Dispense:  90 capsule    Refill:  3  . diclofenac (VOLTAREN) 75 MG EC tablet    Sig: Take 1 tablet (75 mg total) by mouth 2 (two) times daily.    Dispense:  30 tablet    Refill:  0  . buPROPion (WELLBUTRIN XL) 300 MG 24 hr tablet    Sig: Take 1 tablet (300 mg total) by mouth daily.    Dispense:  90 tablet    Refill:  3     Penni Homans, MD

## 2015-09-08 NOTE — Assessment & Plan Note (Signed)
Encouraged heart healthy diet, increase exercise, avoid trans fats, consider a krill oil cap daily 

## 2015-09-08 NOTE — Assessment & Plan Note (Signed)
Worsening and with radicular symptoms. Proceed with imaging.

## 2015-09-08 NOTE — Assessment & Plan Note (Signed)
With some straining and blood noted on tissue at times. Encouraged increased hydration a fiber supplement twice daily, probiotics and Miralax daily or as needed. 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

## 2015-09-08 NOTE — Assessment & Plan Note (Signed)
Check RMSF and Lyme titer blood work.

## 2015-09-08 NOTE — Assessment & Plan Note (Signed)
Worsening neck and back pain with radicular pain in both arms and into legs at times. Will proceed with imaging and referral for further care once that is completed. Encouraged moist heat and gentle stretching as tolerated. May try NSAIDs and prescription meds as directed and report if symptoms worsen or seek immediate care and topical treatments

## 2015-09-09 ENCOUNTER — Ambulatory Visit (HOSPITAL_BASED_OUTPATIENT_CLINIC_OR_DEPARTMENT_OTHER)
Admission: RE | Admit: 2015-09-09 | Discharge: 2015-09-09 | Disposition: A | Discharge status: Home / Self Care | Payer: BLUE CROSS/BLUE SHIELD | Source: Ambulatory Visit | Attending: Family Medicine | Admitting: Family Medicine

## 2015-09-09 DIAGNOSIS — M4802 Spinal stenosis, cervical region: Secondary | ICD-10-CM | POA: Diagnosis not present

## 2015-09-09 DIAGNOSIS — M542 Cervicalgia: Secondary | ICD-10-CM | POA: Diagnosis not present

## 2015-09-09 DIAGNOSIS — M50321 Other cervical disc degeneration at C4-C5 level: Secondary | ICD-10-CM | POA: Insufficient documentation

## 2015-09-09 DIAGNOSIS — R600 Localized edema: Secondary | ICD-10-CM | POA: Diagnosis not present

## 2015-09-09 DIAGNOSIS — M50222 Other cervical disc displacement at C5-C6 level: Secondary | ICD-10-CM | POA: Diagnosis not present

## 2015-09-11 ENCOUNTER — Other Ambulatory Visit: Payer: Self-pay | Admitting: Family Medicine

## 2015-09-11 DIAGNOSIS — M5030 Other cervical disc degeneration, unspecified cervical region: Secondary | ICD-10-CM

## 2015-09-11 DIAGNOSIS — M542 Cervicalgia: Secondary | ICD-10-CM

## 2015-09-11 MED ORDER — FENOFIBRATE MICRONIZED 130 MG PO CAPS: 130.0000 mg | ORAL_CAPSULE | Freq: Every day | ORAL | Status: DC

## 2015-09-14 LAB — ROCKY MTN SPOTTED FVR ABS PNL(IGG+IGM)
RMSF IGM: NOT DETECTED
RMSF IgG: NOT DETECTED

## 2015-09-20 ENCOUNTER — Ambulatory Visit (INDEPENDENT_AMBULATORY_CARE_PROVIDER_SITE_OTHER): Payer: BLUE CROSS/BLUE SHIELD | Admitting: Physician Assistant

## 2015-09-20 ENCOUNTER — Encounter: Payer: Self-pay | Admitting: Physician Assistant

## 2015-09-20 VITALS — BP 122/80 | HR 80 | Ht 70.5 in | Wt 163.0 lb

## 2015-09-20 DIAGNOSIS — K5900 Constipation, unspecified: Secondary | ICD-10-CM

## 2015-09-20 DIAGNOSIS — K648 Other hemorrhoids: Secondary | ICD-10-CM

## 2015-09-20 DIAGNOSIS — K625 Hemorrhage of anus and rectum: Secondary | ICD-10-CM

## 2015-09-20 MED ORDER — HYDROCORTISONE ACETATE 25 MG RE SUPP: RECTAL | Status: DC

## 2015-09-20 NOTE — Progress Notes (Addendum)
Patient ID: Aaron Dyer, male   DOB: 30-Sep-1965, 50 y.o.   MRN: [id number]   Subjective:    Patient ID: Aaron Dyer, male    DOB: Jun 01, 1965, 50 y.o.   MRN: [id number]  HPI  "Aaron Dyer " is a 50 year old white male known to Dr. Hilarie Fredrickson from colonoscopy done in August 2016.'s He has  history of adenomatous colon polyps and was found to have 2 polyps measuring between 3-5 mm which were both removed, mild diverticulosis and internal hemorrhoids. Path from the polyps showed benign polypoid mucosa. He was recommended for 5 year interval follow-up. Patient comes in today to discuss constipation and intermittent rectal bleeding. He says his had more problems with constipation since the colonoscopy last summer and is now only having a bowel movement about twice per week. He says he's had more straining and is having pellet-like stools. He is not using any regular laxatives. He denies abdominal pain. He says is been having intermittent small-volume rectal bleeding over the past couple of years may be a little bit worse recently and had some bleeding last week. The blood is bright red usually present on the tissue and sometimes with blood in the commode. He is interested in having treatment for the hemorrhoid symptoms. He is not certain that he has any external hemorrhoids.   Review of Systems Pertinent positive and negative review of systems were noted in the above HPI section.  All other review of systems was otherwise negative.  Outpatient Encounter Prescriptions as of 09/20/2015  Medication Sig  . aspirin 81 MG tablet Take 81 mg by mouth daily.  Marland Kitchen buPROPion (WELLBUTRIN XL) 300 MG 24 hr tablet Take 1 tablet (300 mg total) by mouth daily.  . diclofenac (VOLTAREN) 75 MG EC tablet Take 1 tablet (75 mg total) by mouth 2 (two) times daily.  . fenofibrate micronized (ANTARA) 130 MG capsule Take 1 capsule (130 mg total) by mouth daily.  Marland Kitchen FLUoxetine (PROZAC) 20 MG capsule Take 1 capsule (20 mg total) by mouth  daily.  . fluticasone (FLONASE) 50 MCG/ACT nasal spray Place 2 sprays into both nostrils every morning.  . mupirocin ointment (BACTROBAN) 2 % APPLY TO NARES EVERY NIGHT AT BEDTIME AS NEEDED FOR LESIONS  . ranitidine (ZANTAC) 300 MG tablet Take 1 tablet (300 mg total) by mouth at bedtime.  . hydrocortisone (ANUSOL-HC) 25 MG suppository Use 1 suppository at bedtime for 5-6-nights.   No facility-administered encounter medications on file as of 09/20/2015.   Allergies  Allergen Reactions  . Crestor [Rosuvastatin]     myalgias   Patient Active Problem List   Diagnosis Date Noted  . Constipation 09/08/2015  . Tick bite 09/08/2015  . Cold intolerance 06/04/2015  . Diverticulosis of colon without hemorrhage 12/18/2014  . Acute lymphadenitis of lower extremity 10/29/2014  . Elevated WBCs 04/10/2014  . Neck pain, bilateral 11/29/2013  . ED (erectile dysfunction) 08/22/2013  . Back pain 03/15/2013  . Laceration of skin of scalp 09/05/2012  . Lipoma 04/21/2012  . Sun-damaged skin 04/21/2012  . Chest pain 11/19/2011  . Colon polyps 05/28/2011  . Insomnia 05/28/2011  . Urinary frequency 04/30/2011  . Hematuria 04/30/2011  . Reflux 04/29/2011  . Hemorrhoids 04/29/2011  . Anxiety and depression   . Skin cancer   . Hyperlipidemia    Social History   Social History  . Marital Status: Married    Spouse Name: Sharyn Lull  . Number of Children: 2  . Years of Education: N/A   Occupational  History  . Landscape    Social History Main Topics  . Smoking status: Never Smoker   . Smokeless tobacco: Never Used  . Alcohol Use: 0.0 oz/week    0 Standard drinks or equivalent per week     Comment: twice monthly  . Drug Use: No  . Sexual Activity: Yes   Other Topics Concern  . Not on file   Social History Narrative    Mr. Shaw's family history includes Alcohol abuse in his father; COPD in his mother; Cancer in his father; Diabetes in his maternal grandmother and mother; Emphysema in his  mother; Heart attack in his maternal grandfather; Heart disease in his father, maternal grandmother, mother, paternal grandfather, and paternal grandmother; Hyperlipidemia in his mother; Hypertension in his father and mother; Stroke in his father and mother. There is no history of Colon cancer or Colon polyps.      Objective:    Filed Vitals:   09/20/15 1014  BP: 122/80  Pulse: 80    Physical Exam  well-developed white male in no acute distress, accompanied by his wife. A pressure 122/80 pulse 80 height 5 foot 10 weight 163 BMI of 23. HEENT nontraumatic, cephalic EOMI PERRLA sclera anicteric, not further examined today, patient declined rectal exam.     Assessment & Plan:   #1 50 yo male with chronic constipation over the past 1 year #2 intermittent rectal bleeding secondary to previously documented internal hemorrhoids #3 history of adenomatous colon polyps-up to date with colonoscopy last done in August 2016 and recommended for 5 year interval follow-up #4 diverticulosis  Plan; Discussion with patient regarding management of chronic constipation, advised him add Benefiber on a daily basis and use MiraLAX 17 g in 8 ounces of water daily or every other day. Increase fiber in diet and increase water intake Have sent a prescription for Anusol HC suppositories for when necessary use for hemorrhoidal bleeding. We also discussed in office hemorrhoidal banding for internal hemorrhoids. Patient will be scheduled for banding with Dr. Hilarie Fredrickson.  Amy S Esterwood PA-C 09/20/2015   Cc: Mosie Lukes, MD  Addendum: Reviewed and agree with initial management. Jerene Bears, MD

## 2015-09-20 NOTE — Patient Instructions (Addendum)
Start Miralax, 17 grams in 8 oz of water daily. Samples provided. Add Benefiber  daily.  Increase water intake every day.   We sent a prescription to [facility] Anusol HC Suppositories.   We made you an appointment for a hemorrhoidal banding with Dr. Zenovia Jarred, Date is 11-14-2015 at 3:30 PM.

## 2015-10-06 ENCOUNTER — Other Ambulatory Visit: Payer: Self-pay | Admitting: Family Medicine

## 2015-10-10 DIAGNOSIS — M545 Low back pain: Secondary | ICD-10-CM | POA: Diagnosis not present

## 2015-10-10 DIAGNOSIS — M4722 Other spondylosis with radiculopathy, cervical region: Secondary | ICD-10-CM | POA: Diagnosis not present

## 2015-10-10 DIAGNOSIS — M5416 Radiculopathy, lumbar region: Secondary | ICD-10-CM | POA: Diagnosis not present

## 2015-10-10 DIAGNOSIS — M47892 Other spondylosis, cervical region: Secondary | ICD-10-CM | POA: Diagnosis not present

## 2015-10-10 DIAGNOSIS — R030 Elevated blood-pressure reading, without diagnosis of hypertension: Secondary | ICD-10-CM | POA: Diagnosis not present

## 2015-10-12 DIAGNOSIS — L570 Actinic keratosis: Secondary | ICD-10-CM | POA: Diagnosis not present

## 2015-10-12 DIAGNOSIS — C4330 Malignant melanoma of unspecified part of face: Secondary | ICD-10-CM | POA: Diagnosis not present

## 2015-10-12 DIAGNOSIS — Z85820 Personal history of malignant melanoma of skin: Secondary | ICD-10-CM | POA: Diagnosis not present

## 2015-10-12 DIAGNOSIS — Z85828 Personal history of other malignant neoplasm of skin: Secondary | ICD-10-CM | POA: Diagnosis not present

## 2015-10-12 DIAGNOSIS — D485 Neoplasm of uncertain behavior of skin: Secondary | ICD-10-CM | POA: Diagnosis not present

## 2015-10-12 DIAGNOSIS — D489 Neoplasm of uncertain behavior, unspecified: Secondary | ICD-10-CM | POA: Diagnosis not present

## 2015-10-12 DIAGNOSIS — L821 Other seborrheic keratosis: Secondary | ICD-10-CM | POA: Diagnosis not present

## 2015-11-01 DIAGNOSIS — M545 Low back pain: Secondary | ICD-10-CM | POA: Diagnosis not present

## 2015-11-01 DIAGNOSIS — M5136 Other intervertebral disc degeneration, lumbar region: Secondary | ICD-10-CM | POA: Diagnosis not present

## 2015-11-02 ENCOUNTER — Encounter: Payer: Self-pay | Admitting: *Deleted

## 2015-11-03 ENCOUNTER — Other Ambulatory Visit: Payer: Self-pay | Admitting: Family Medicine

## 2015-11-14 ENCOUNTER — Ambulatory Visit: Payer: BLUE CROSS/BLUE SHIELD | Admitting: Internal Medicine

## 2015-11-14 ENCOUNTER — Encounter: Payer: Self-pay | Admitting: Internal Medicine

## 2015-11-14 VITALS — BP 100/68 | HR 72 | Ht 68.0 in | Wt 169.8 lb

## 2015-11-14 DIAGNOSIS — M5416 Radiculopathy, lumbar region: Secondary | ICD-10-CM | POA: Diagnosis not present

## 2015-11-14 DIAGNOSIS — M545 Low back pain: Secondary | ICD-10-CM | POA: Diagnosis not present

## 2015-11-14 DIAGNOSIS — M4722 Other spondylosis with radiculopathy, cervical region: Secondary | ICD-10-CM | POA: Diagnosis not present

## 2015-11-14 NOTE — Progress Notes (Signed)
Patient was scheduled visit today with me for consideration of banding of internal hemorrhoids, symptomatic at the time of last office visit with Nicoletta Ba, PA-C  He was brought back by CMA and placed in a room. When I came out of my prior patient encounter, the patient had left his examination room and could not be located. [facility] office staff stated that he left stating he had another doctor's appointment "at 4:30". He did not wish to reschedule at this time  We remain available and he can contact us for follow-up or new banding appointment at his discretion

## 2015-12-01 ENCOUNTER — Ambulatory Visit: Payer: BLUE CROSS/BLUE SHIELD | Admitting: Family Medicine

## 2015-12-28 ENCOUNTER — Other Ambulatory Visit: Payer: Self-pay | Admitting: Family

## 2015-12-28 NOTE — Telephone Encounter (Signed)
I refilled diclofenac. I think I gave it in past for hand pain. Some osteoarthritis. Will you call pt and advise sent in rx. But also would like to know is he requesting for hand pain or what other area of pain?

## 2015-12-29 NOTE — Telephone Encounter (Signed)
Called patient. Left message for return call. 

## 2016-02-05 ENCOUNTER — Other Ambulatory Visit: Payer: Self-pay | Admitting: Medical

## 2016-03-12 ENCOUNTER — Other Ambulatory Visit: Payer: Self-pay | Admitting: Medical

## 2016-03-25 ENCOUNTER — Other Ambulatory Visit: Payer: Self-pay | Admitting: Family Medicine

## 2016-03-25 DIAGNOSIS — W57XXXA Bitten or stung by nonvenomous insect and other nonvenomous arthropods, initial encounter: Secondary | ICD-10-CM

## 2016-03-25 DIAGNOSIS — F418 Other specified anxiety disorders: Secondary | ICD-10-CM

## 2016-03-25 DIAGNOSIS — F32A Depression, unspecified: Secondary | ICD-10-CM

## 2016-03-25 DIAGNOSIS — M545 Low back pain, unspecified: Secondary | ICD-10-CM

## 2016-03-25 DIAGNOSIS — F329 Major depressive disorder, single episode, unspecified: Secondary | ICD-10-CM

## 2016-03-25 DIAGNOSIS — K649 Unspecified hemorrhoids: Secondary | ICD-10-CM

## 2016-03-25 DIAGNOSIS — S0101XA Laceration without foreign body of scalp, initial encounter: Secondary | ICD-10-CM

## 2016-03-25 DIAGNOSIS — E785 Hyperlipidemia, unspecified: Secondary | ICD-10-CM

## 2016-03-25 DIAGNOSIS — D72829 Elevated white blood cell count, unspecified: Secondary | ICD-10-CM

## 2016-04-10 ENCOUNTER — Other Ambulatory Visit: Payer: Self-pay | Admitting: Family Medicine

## 2016-05-14 ENCOUNTER — Other Ambulatory Visit: Payer: Self-pay | Admitting: Family Medicine

## 2016-05-30 NOTE — Progress Notes (Deleted)
Subjective:   Aaron Dyer is a 51 y.o. male who presents for an Initial Medicare Annual Wellness Visit.  The Patient was informed that the wellness visit is to identify future health risk and educate and initiate measures that can reduce risk for increased disease through the lifespan.   Describes health as fair, good or great?  Review of Systems  No ROS.  Medicare Wellness Visit.     Sleep patterns: {SX; SLEEP PATTERNS:18802::"feels rested on waking","does not get up to void","gets up *** times nightly to void","sleeps *** hours nightly"}.   Home Safety/Smoke Alarms:   Living environment; residence and Firearm Safety: {Rehab home environment / accessibility:30080::"no firearms","firearms stored safely"}. Seat Belt Safety/Bike Helmet: Wears seat belt.   Counseling:   Eye Exam-  Dental-  Male:   CCS- last 11/10/14. 2 polyps, moderate diverticulosis. 5 year recall.   PSA-  Lab Results  Component Value Date   PSA 0.48 04/13/2015   PSA 0.42 11/25/2013   PSA 0.47 04/29/2011      Objective:    There were no vitals filed for this visit. There is no height or weight on file to calculate BMI.  Current Medications (verified) Outpatient Encounter Prescriptions as of 06/03/2016  Medication Sig  . aspirin 81 MG tablet Take 81 mg by mouth daily.  Marland Kitchen buPROPion (WELLBUTRIN XL) 300 MG 24 hr tablet Take 1 tablet (300 mg total) by mouth daily.  . diclofenac (VOLTAREN) 75 MG EC tablet TAKE ONE TABLET BY MOUTH TWICE DAILY  . fenofibrate micronized (ANTARA) 130 MG capsule Take 1 capsule (130 mg total) by mouth daily.  Marland Kitchen FLUoxetine (PROZAC) 20 MG capsule Take 1 capsule (20 mg total) by mouth daily.  . fluticasone (FLONASE) 50 MCG/ACT nasal spray Place 2 sprays into both nostrils every morning.  . mupirocin ointment (BACTROBAN) 2 % APPLY TO NARES EVERY NIGHT AT BEDTIME AS NEEDED FOR LESIONS  . ranitidine (ZANTAC) 300 MG tablet TAKE ONE TABLET BY MOUTH ONCE DAILY AT BEDTIME   No  facility-administered encounter medications on file as of 06/03/2016.     Allergies (verified) Crestor [rosuvastatin]   History: Past Medical History:  Diagnosis Date  . Anxiety and depression   . Back pain 03/15/2013  . Cancer (HCC)    face, arms, legs, and ear, SCC  . Chicken pox as a child  . Colon polyps 05/28/2011  . Constipation 09/08/2015  . Depression   . Diverticulosis of colon without hemorrhage 12/18/2014  . ED (erectile dysfunction) 08/22/2013  . Elevated WBCs 04/10/2014  . GERD (gastroesophageal reflux disease)   . Hematuria 04/30/2011  . Hemorrhoids 04/29/2011  . Hyperlipidemia   . IBS (irritable bowel syndrome)   . Insomnia 05/28/2011  . Laceration of skin of scalp 09/05/2012   right  . Lipoma 04/21/2012   On back near left shoulder blade and above right hip  . Mumps as a child  . Rectal bleeding 04/29/2011  . Skin cancer    face, arms, legs, and ear skin cancer Followed with surgeon who has retired per patient, has had 2 Mohs procedures on his right ear and 2 lesions excised, one form each leg.   . Sun-damaged skin 04/21/2012  . Tubular adenoma of colon   . Urinary frequency 04/30/2011  . Urinary hesitancy 04/30/2011   Past Surgical History:  Procedure Laterality Date  . MOHS SURGERY  2010   right ear, twice  . remove squamous cell     b/l legs   Family History  Problem Relation Age of Onset  . COPD Mother   . Emphysema Mother   . Stroke Mother     X 2  . Heart disease Mother     CABG in her 1s  . Hyperlipidemia Mother   . Hypertension Mother   . Diabetes Mother     type 2  . Cancer Father     melanoma  . Stroke Father     21  . Hypertension Father   . Alcohol abuse Father   . Heart disease Father   . Diabetes Maternal Grandmother     type 2  . Heart disease Maternal Grandmother     CHF  . Heart attack Maternal Grandfather   . Heart disease Paternal Grandmother   . Heart disease Paternal Grandfather   . Colon cancer Neg Hx   . Colon polyps Neg Hx     Social History   Occupational History  . Landscape    Social History Main Topics  . Smoking status: Never Smoker  . Smokeless tobacco: Never Used  . Alcohol use 0.0 oz/week     Comment: twice monthly  . Drug use: No  . Sexual activity: Yes   Tobacco Counseling Counseling given: Not Answered   Activities of Daily Living No flowsheet data found.  Immunizations and Health Maintenance Immunization History  Administered Date(s) Administered  . Td 03/25/2005  . Tdap 05/29/2015   Health Maintenance Due  Topic Date Due  . HIV Screening  01/22/1980  . INFLUENZA VACCINE  10/24/2015    Patient Care Team: Mosie Lukes, MD as PCP - General (Family Medicine)  Indicate any recent Medical Services you may have received from other than [facility] providers in the past year (date may be approximate).    Assessment:   This is a routine wellness examination for Aaron Dyer. Physical assessment deferred to PCP.  Hearing/Vision screen No exam data present  Dietary issues and exercise activities discussed:    Diet (meal preparation, eat out, water intake, caffeinated beverages, dairy products, fruits and vegetables): {Desc; diets:16563} Breakfast: Lunch:  Dinner:      Goals    None     Depression Screen No flowsheet data found.  Fall Risk No flowsheet data found.  Cognitive Function:        Screening Tests Health Maintenance  Topic Date Due  . HIV Screening  01/22/1980  . INFLUENZA VACCINE  10/24/2015  . COLONOSCOPY  11/10/2019  . TETANUS/TDAP  05/28/2025        Plan:    Follow-up w/ PCP as scheduled.  During the course of the visit Aaron Dyer was educated and counseled about the following appropriate screening and preventive services:   Vaccines to include Pneumoccal, Influenza, Hepatitis B, Td, Zostavax, HCV  Colorectal cancer screening  Cardiovascular disease screening  Diabetes screening  Glaucoma screening  Nutrition counseling  Prostate cancer  screening  Smoking cessation counseling  Patient Instructions (the written plan) were given to the patient.   Dorrene German, RN   05/30/2016

## 2016-05-30 NOTE — Progress Notes (Deleted)
Pre visit review using our clinic review tool, if applicable. No additional management support is needed unless otherwise documented below in the visit note. 

## 2016-06-03 ENCOUNTER — Ambulatory Visit: Payer: Medicare Other | Admitting: *Deleted

## 2016-06-11 ENCOUNTER — Other Ambulatory Visit: Payer: Self-pay | Admitting: Family Medicine

## 2016-08-11 ENCOUNTER — Other Ambulatory Visit: Payer: Self-pay | Admitting: Family Medicine

## 2016-08-11 DIAGNOSIS — K649 Unspecified hemorrhoids: Secondary | ICD-10-CM

## 2016-08-11 DIAGNOSIS — F32A Depression, unspecified: Secondary | ICD-10-CM

## 2016-08-11 DIAGNOSIS — W57XXXA Bitten or stung by nonvenomous insect and other nonvenomous arthropods, initial encounter: Secondary | ICD-10-CM

## 2016-08-11 DIAGNOSIS — F418 Other specified anxiety disorders: Secondary | ICD-10-CM

## 2016-08-11 DIAGNOSIS — S0101XA Laceration without foreign body of scalp, initial encounter: Secondary | ICD-10-CM

## 2016-08-11 DIAGNOSIS — F329 Major depressive disorder, single episode, unspecified: Secondary | ICD-10-CM

## 2016-08-11 DIAGNOSIS — M545 Low back pain, unspecified: Secondary | ICD-10-CM

## 2016-08-11 DIAGNOSIS — D72829 Elevated white blood cell count, unspecified: Secondary | ICD-10-CM

## 2016-08-11 DIAGNOSIS — E785 Hyperlipidemia, unspecified: Secondary | ICD-10-CM

## 2016-09-01 ENCOUNTER — Encounter (HOSPITAL_BASED_OUTPATIENT_CLINIC_OR_DEPARTMENT_OTHER): Payer: Self-pay | Admitting: Emergency Medicine

## 2016-09-01 ENCOUNTER — Emergency Department (HOSPITAL_BASED_OUTPATIENT_CLINIC_OR_DEPARTMENT_OTHER)
Admission: EM | Admit: 2016-09-01 | Discharge: 2016-09-01 | Disposition: A | Discharge status: Home / Self Care | Payer: BLUE CROSS/BLUE SHIELD | Attending: Emergency Medicine | Admitting: Emergency Medicine

## 2016-09-01 DIAGNOSIS — Y939 Activity, unspecified: Secondary | ICD-10-CM | POA: Diagnosis not present

## 2016-09-01 DIAGNOSIS — Z85828 Personal history of other malignant neoplasm of skin: Secondary | ICD-10-CM | POA: Insufficient documentation

## 2016-09-01 DIAGNOSIS — W57XXXA Bitten or stung by nonvenomous insect and other nonvenomous arthropods, initial encounter: Secondary | ICD-10-CM | POA: Diagnosis not present

## 2016-09-01 DIAGNOSIS — Y929 Unspecified place or not applicable: Secondary | ICD-10-CM | POA: Insufficient documentation

## 2016-09-01 DIAGNOSIS — M545 Low back pain: Secondary | ICD-10-CM | POA: Diagnosis present

## 2016-09-01 DIAGNOSIS — M5441 Lumbago with sciatica, right side: Secondary | ICD-10-CM | POA: Diagnosis not present

## 2016-09-01 DIAGNOSIS — R51 Headache: Secondary | ICD-10-CM | POA: Insufficient documentation

## 2016-09-01 DIAGNOSIS — R519 Headache, unspecified: Secondary | ICD-10-CM

## 2016-09-01 DIAGNOSIS — Z79899 Other long term (current) drug therapy: Secondary | ICD-10-CM | POA: Insufficient documentation

## 2016-09-01 DIAGNOSIS — S1096XA Insect bite of unspecified part of neck, initial encounter: Secondary | ICD-10-CM | POA: Diagnosis not present

## 2016-09-01 DIAGNOSIS — Z7982 Long term (current) use of aspirin: Secondary | ICD-10-CM | POA: Diagnosis not present

## 2016-09-01 DIAGNOSIS — Y998 Other external cause status: Secondary | ICD-10-CM | POA: Diagnosis not present

## 2016-09-01 DIAGNOSIS — S40262A Insect bite (nonvenomous) of left shoulder, initial encounter: Secondary | ICD-10-CM | POA: Diagnosis not present

## 2016-09-01 MED ORDER — PREDNISONE 50 MG PO TABS
60.0000 mg | ORAL_TABLET | Freq: Once | ORAL | Status: AC
  Administered 2016-09-01: 60 mg via ORAL
  Filled 2016-09-01: qty 1

## 2016-09-01 MED ORDER — TRAMADOL HCL 50 MG PO TABS: 50.0000 mg | ORAL_TABLET | Freq: Four times a day (QID) | ORAL | 0 refills | Status: DC | PRN

## 2016-09-01 MED ORDER — TRAMADOL HCL 50 MG PO TABS
50.0000 mg | ORAL_TABLET | Freq: Once | ORAL | Status: AC
  Administered 2016-09-01: 50 mg via ORAL
  Filled 2016-09-01: qty 1

## 2016-09-01 MED ORDER — PREDNISONE 20 MG PO TABS: ORAL_TABLET | ORAL | 0 refills | Status: DC

## 2016-09-01 NOTE — ED Triage Notes (Addendum)
R lower back pain radiating down R leg since Thursday, denies injury. Also reports intermittent headaches. Denies urinary symptoms. Pt states he had a tick bite 1 week ago.

## 2016-09-01 NOTE — ED Provider Notes (Signed)
[facility] Provider Note   CSN: [id number] Arrival date & time: 09/01/16  6546     History   Chief Complaint Chief Complaint  Patient presents with  . Back Pain    HPI ADD DINAPOLI is a 51 y.o. male.  The history is provided by the patient and medical records.     51 year old male with history of anxiety, depression, ED, hyperlipidemia, IBS, history of skin cancer, presenting to the ED for multiple complaints.  1.  Back pain.  States localized to right lower back since Thursday (4 days ago).  States pain does radiate into his right leg without any associated numbness or weakness. He has not had difficulty walking. Pain is worse with lifting the left leg up towards the hip. No bowel or bladder incontinence. Denies any recent injury, trauma, or falls. No heavy lifting. Has been trying some Aleve and muscle relaxers at home without relief.  2.  Headache. States has been intermittent over the past week. States he does get headaches from time to time, but feels it is more frequent. He has not had any numbness, weakness, confusion, changes in speech, difficult walking, or blurred vision. Denies history of migraines.  Denies headache currently in the ED.  3.  Tick bite.  States this happened a week ago. States wife pulled it off his right posterior neck with tweezers. States it was very small and did not appear engorged. He estimates it was therefore no more than a few hours. He has not had any rash, body aches, fever, joint swelling, or abdominal pain.  Past Medical History:  Diagnosis Date  . Anxiety and depression   . Back pain 03/15/2013  . Cancer (HCC)    face, arms, legs, and ear, SCC  . Chicken pox as a child  . Colon polyps 05/28/2011  . Constipation 09/08/2015  . Depression   . Diverticulosis of colon without hemorrhage 12/18/2014  . ED (erectile dysfunction) 08/22/2013  . Elevated WBCs 04/10/2014  . GERD (gastroesophageal reflux disease)   . Hematuria 04/30/2011    . Hemorrhoids 04/29/2011  . Hyperlipidemia   . IBS (irritable bowel syndrome)   . Insomnia 05/28/2011  . Laceration of skin of scalp 09/05/2012   right  . Lipoma 04/21/2012   On back near left shoulder blade and above right hip  . Mumps as a child  . Rectal bleeding 04/29/2011  . Skin cancer    face, arms, legs, and ear skin cancer Followed with surgeon who has retired per patient, has had 2 Mohs procedures on his right ear and 2 lesions excised, one form each leg.   . Sun-damaged skin 04/21/2012  . Tubular adenoma of colon   . Urinary frequency 04/30/2011  . Urinary hesitancy 04/30/2011    Patient Active Problem List   Diagnosis Date Noted  . Constipation 09/08/2015  . Tick bite 09/08/2015  . Cold intolerance 06/04/2015  . Diverticulosis of colon without hemorrhage 12/18/2014  . Acute lymphadenitis of lower extremity 10/29/2014  . Elevated WBCs 04/10/2014  . Neck pain, bilateral 11/29/2013  . ED (erectile dysfunction) 08/22/2013  . Back pain 03/15/2013  . Laceration of skin of scalp 09/05/2012  . Lipoma 04/21/2012  . Sun-damaged skin 04/21/2012  . Chest pain 11/19/2011  . Colon polyps 05/28/2011  . Insomnia 05/28/2011  . Urinary frequency 04/30/2011  . Hematuria 04/30/2011  . Reflux 04/29/2011  . Hemorrhoids 04/29/2011  . Anxiety and depression   . Skin cancer   . Hyperlipidemia  Past Surgical History:  Procedure Laterality Date  . MOHS SURGERY  2010   right ear, twice  . remove squamous cell     b/l legs       Home Medications    Prior to Admission medications   Medication Sig Start Date End Date Taking? Authorizing Provider  aspirin 81 MG tablet Take 81 mg by mouth daily.   Yes [provider]  buPROPion (WELLBUTRIN XL) 300 MG 24 hr tablet Take 1 tablet (300 mg total) by mouth daily. 08/29/15  Yes Mosie Lukes, MD  FLUoxetine (PROZAC) 20 MG capsule Take 1 capsule (20 mg total) by mouth daily. 08/29/15  Yes Mosie Lukes, MD  ranitidine (ZANTAC) 300  MG tablet TAKE ONE TABLET BY MOUTH ONCE DAILY AT BEDTIME 08/12/16  Yes Mosie Lukes, MD  diclofenac (VOLTAREN) 75 MG EC tablet TAKE 1 TABLET BY MOUTH TWICE DAILY 06/11/16   Mosie Lukes, MD  fenofibrate micronized (ANTARA) 130 MG capsule Take 1 capsule (130 mg total) by mouth daily. 09/11/15   Mosie Lukes, MD  fluticasone (FLONASE) 50 MCG/ACT nasal spray Place 2 sprays into both nostrils every morning. 08/29/15   Mosie Lukes, MD  mupirocin ointment (BACTROBAN) 2 % APPLY TO NARES EVERY NIGHT AT BEDTIME AS NEEDED FOR LESIONS 08/29/15   Mosie Lukes, MD    Family History Family History  Problem Relation Age of Onset  . COPD Mother   . Emphysema Mother   . Stroke Mother        X 2  . Heart disease Mother        CABG in her 53s  . Hyperlipidemia Mother   . Hypertension Mother   . Diabetes Mother        type 2  . Cancer Father        melanoma  . Stroke Father        37  . Hypertension Father   . Alcohol abuse Father   . Heart disease Father   . Diabetes Maternal Grandmother        type 2  . Heart disease Maternal Grandmother        CHF  . Heart attack Maternal Grandfather   . Heart disease Paternal Grandmother   . Heart disease Paternal Grandfather   . Colon cancer Neg Hx   . Colon polyps Neg Hx     Social History Social History  Substance Use Topics  . Smoking status: Never Smoker  . Smokeless tobacco: Never Used  . Alcohol use 0.0 oz/week     Comment: twice monthly     Allergies   Crestor [rosuvastatin]   Review of Systems Review of Systems  Musculoskeletal: Positive for back pain.  Neurological: Positive for headaches (none currently).  All other systems reviewed and are negative.    Physical Exam Updated Vital Signs BP (!) 144/96 (BP Location: Right Arm)   Pulse 66   Temp 98.8 F (37.1 C) (Oral)   Resp 16   Ht 5\' 9"  (1.753 m)   Wt 77.1 kg (170 lb)   SpO2 97%   BMI 25.10 kg/m   Physical Exam  Constitutional: He is oriented to person,  place, and time. He appears well-developed and well-nourished. No distress.  HENT:  Head: Normocephalic and atraumatic.  Right Ear: External ear normal.  Left Ear: External ear normal.  Mouth/Throat: Oropharynx is clear and moist.  Eyes: Conjunctivae and EOM are normal. Pupils are equal, round, and reactive  to light.  Neck: Normal range of motion and full passive range of motion without pain. Neck supple. No neck rigidity.  No rigidity, no meningismus No apparent wounds or bug bites noted along posterior neck  Cardiovascular: Normal rate, regular rhythm and normal heart sounds.   No murmur heard. Pulmonary/Chest: Effort normal and breath sounds normal. No respiratory distress. He has no wheezes. He has no rhonchi.  Abdominal: Soft. Bowel sounds are normal. There is no tenderness. There is no rebound and no guarding.  Musculoskeletal: Normal range of motion. He exhibits no edema.  Tenderness of right SI joint on exam, no bony deformity, + SLR on right, normal strength/sensation of both legs, normal gait  Neurological: He is alert and oriented to person, place, and time. He has normal strength. He displays no tremor. No cranial nerve deficit or sensory deficit. He displays no seizure activity.  AAOx3, answering questions and following commands appropriately; equal strength UE and LE bilaterally; CN grossly intact; moves all extremities appropriately without ataxia; no focal neuro deficits or facial asymmetry appreciated  Skin: Skin is warm and dry. No rash noted. He is not diaphoretic.  No rashes noted  Psychiatric: He has a normal mood and affect. His behavior is normal. Thought content normal.  Nursing note and vitals reviewed.    ED Treatments / Results  Labs (all labs ordered are listed, but only abnormal results are displayed) Labs Reviewed - No data to display  EKG  EKG Interpretation None       Radiology No results found.  Procedures Procedures (including critical care  time)  Medications Ordered in ED Medications  traMADol (ULTRAM) tablet 50 mg (50 mg Oral Given 09/01/16 1039)  predniSONE (DELTASONE) tablet 60 mg (60 mg Oral Given 09/01/16 1040)     Initial Impression / Assessment and Plan / ED Course  I have reviewed the triage vital signs and the nursing notes.  Pertinent labs & imaging results that were available during my care of the patient were reviewed by me and considered in my medical decision making (see chart for details).  51 y.o. M here with multiple complaints.  1.  Back pain-- History and physical exam concerning for sciatica/lumbar radiculopathy. He has no focal neurologic deficits concerning for cauda equina. Has not had any relief with anti-inflammatories or muscle relaxer so will discharge home with tramadol and prednisone.  2.  Intermittent headaches-- none currently but states increasing in frequency lately. Again neurologically intact. No signs or symptoms concerning for meningitis. Recommend continued supportive care.  3.  Tick bite-- occurred 1 week ago. Estimated attachment for less than a few hours.  Tick was small when removed.  No rash, fever, diffuse joint pain, abdominal pain, or other symptoms suggestive of tick borne illness. Do not feel prophylactic doxycycline indicated at this time.  Close monitoring at home.  We'll have him follow-up with his primary care doctor for any ongoing issues.  Discussed plan with patient, he acknowledged understanding and agreed with plan of care.  Return precautions given for new or worsening symptoms.  Final Clinical Impressions(s) / ED Diagnoses   Final diagnoses:  Acute right-sided low back pain with right-sided sciatica  Tick bite, initial encounter  Increased frequency of headaches    New Prescriptions New Prescriptions   PREDNISONE (DELTASONE) 20 MG TABLET    Take 40 mg by mouth daily for 3 days, then 20mg  by mouth daily for 3 days, then 10mg  daily for 3 days   TRAMADOL (ULTRAM)  50 MG TABLET    Take 1 tablet (50 mg total) by mouth every 6 (six) hours as needed.     Larene Pickett, PA-C 09/01/16 [place], [place], DO 09/01/16 1251

## 2016-09-01 NOTE — ED Notes (Signed)
Pt ambulatory unassisted, steady gait.

## 2016-09-01 NOTE — Discharge Instructions (Signed)
Take the prescribed medication as directed. Follow-up with your primary care doctor if any ongoing isuses. Return to the ED for new or worsening symptoms.

## 2016-09-11 ENCOUNTER — Telehealth: Payer: Self-pay | Admitting: Family Medicine

## 2016-09-11 ENCOUNTER — Ambulatory Visit: Payer: Medicare Other | Admitting: Family Medicine

## 2016-09-11 ENCOUNTER — Other Ambulatory Visit: Payer: Self-pay | Admitting: Family Medicine

## 2016-09-11 DIAGNOSIS — E785 Hyperlipidemia, unspecified: Secondary | ICD-10-CM

## 2016-09-11 DIAGNOSIS — F329 Major depressive disorder, single episode, unspecified: Secondary | ICD-10-CM

## 2016-09-11 DIAGNOSIS — W57XXXA Bitten or stung by nonvenomous insect and other nonvenomous arthropods, initial encounter: Secondary | ICD-10-CM

## 2016-09-11 DIAGNOSIS — M545 Low back pain, unspecified: Secondary | ICD-10-CM

## 2016-09-11 DIAGNOSIS — F418 Other specified anxiety disorders: Secondary | ICD-10-CM

## 2016-09-11 DIAGNOSIS — D72829 Elevated white blood cell count, unspecified: Secondary | ICD-10-CM

## 2016-09-11 DIAGNOSIS — K649 Unspecified hemorrhoids: Secondary | ICD-10-CM

## 2016-09-11 DIAGNOSIS — Z0289 Encounter for other administrative examinations: Secondary | ICD-10-CM

## 2016-09-11 DIAGNOSIS — S0101XA Laceration without foreign body of scalp, initial encounter: Secondary | ICD-10-CM

## 2016-09-11 DIAGNOSIS — F32A Depression, unspecified: Secondary | ICD-10-CM

## 2016-09-11 NOTE — Telephone Encounter (Signed)
No charge. TY.

## 2016-09-11 NOTE — Telephone Encounter (Signed)
Has appt scheduled for today at 3:00 for back pain, unable to get off work, did schedule for tomorrow at 3:15. Charge NOS fee?

## 2016-09-12 ENCOUNTER — Encounter: Payer: Self-pay | Admitting: Family Medicine

## 2016-09-12 ENCOUNTER — Ambulatory Visit (INDEPENDENT_AMBULATORY_CARE_PROVIDER_SITE_OTHER): Payer: BLUE CROSS/BLUE SHIELD | Admitting: Family Medicine

## 2016-09-12 VITALS — BP 110/82 | HR 75 | Temp 97.9°F | Ht 70.5 in | Wt 165.6 lb

## 2016-09-12 DIAGNOSIS — M545 Low back pain, unspecified: Secondary | ICD-10-CM

## 2016-09-12 DIAGNOSIS — K644 Residual hemorrhoidal skin tags: Secondary | ICD-10-CM | POA: Diagnosis not present

## 2016-09-12 MED ORDER — NAPROXEN 500 MG PO TABS: 500.0000 mg | ORAL_TABLET | Freq: Two times a day (BID) | ORAL | 0 refills | Status: DC

## 2016-09-12 MED ORDER — HYDROCORTISONE 2.5 % RE CREA: 1.0000 "application " | TOPICAL_CREAM | Freq: Two times a day (BID) | RECTAL | 0 refills | Status: DC

## 2016-09-12 MED ORDER — CYCLOBENZAPRINE HCL 5 MG PO TABS: 5.0000 mg | ORAL_TABLET | Freq: Three times a day (TID) | ORAL | 0 refills | Status: DC | PRN

## 2016-09-12 NOTE — Progress Notes (Signed)
Musculoskeletal Exam  Patient: Aaron Dyer DOB: [birth year]  DOS: 09/12/2016  SUBJECTIVE:  Chief Complaint:   Chief Complaint  Patient presents with  . Back Pain    (R) lower-pt was seen in ER on 09/01/16-pt stated the meds have helped some-pain radiating down the (R) leg  . Rectal Bleeding    x 2 weeks-same time the back pain started    Aaron Dyer is a 51 y.o.  male for evaluation and treatment of his back pain.   Onset:  2 days ago. No injury or change in activity.  Location: Lower R Character:  dull twist  Progression of issue:  has moderately improved Associated symptoms: Decreased ROM Denies bowel/bladder incontinence or weakness Treatment: to date has been Aleve, Tramadol, Prednisone.   Neurovascular symptoms: no  +rectal bleeding for 2 weeks also. Has a hx of hemorrhoids and constipation. Had a colonoscopy when he turned 45, having close follow up due to multiple polyps found. He has been particularly constipated recently.   ROS: Gastrointestinal: no bowel incontinence Genitourinary: No bladder incontinence Musculoskeletal/Extremities: +back pain Neurologic: no numbness, tingling no weakness   Past Medical History:  Diagnosis Date  . Anxiety and depression   . Back pain 03/15/2013  . Cancer (HCC)    face, arms, legs, and ear, SCC  . Chicken pox as a child  . Colon polyps 05/28/2011  . Constipation 09/08/2015  . Depression   . Diverticulosis of colon without hemorrhage 12/18/2014  . ED (erectile dysfunction) 08/22/2013  . Elevated WBCs 04/10/2014  . GERD (gastroesophageal reflux disease)   . Hematuria 04/30/2011  . Hemorrhoids 04/29/2011  . Hyperlipidemia   . IBS (irritable bowel syndrome)   . Insomnia 05/28/2011  . Laceration of skin of scalp 09/05/2012   right  . Lipoma 04/21/2012   On back near left shoulder blade and above right hip  . Mumps as a child  . Rectal bleeding 04/29/2011  . Skin cancer    face, arms, legs, and ear skin cancer Followed with  surgeon who has retired per patient, has had 2 Mohs procedures on his right ear and 2 lesions excised, one form each leg.   . Sun-damaged skin 04/21/2012  . Tubular adenoma of colon   . Urinary frequency 04/30/2011  . Urinary hesitancy 04/30/2011   Past Surgical History:  Procedure Laterality Date  . MOHS SURGERY  2010   right ear, twice  . remove squamous cell     b/l legs   Family History  Problem Relation Age of Onset  . COPD Mother   . Emphysema Mother   . Stroke Mother        X 2  . Heart disease Mother        CABG in her 46s  . Hyperlipidemia Mother   . Hypertension Mother   . Diabetes Mother        type 2  . Cancer Father        melanoma  . Stroke Father        34  . Hypertension Father   . Alcohol abuse Father   . Heart disease Father   . Diabetes Maternal Grandmother        type 2  . Heart disease Maternal Grandmother        CHF  . Heart attack Maternal Grandfather   . Heart disease Paternal Grandmother   . Heart disease Paternal Grandfather   . Colon cancer Neg Hx   . Colon polyps Neg  Hx    Current Outpatient Prescriptions  Medication Sig Dispense Refill  . aspirin 81 MG tablet Take 81 mg by mouth daily.    Marland Kitchen buPROPion (WELLBUTRIN XL) 300 MG 24 hr tablet Take 1 tablet (300 mg total) by mouth daily. 90 tablet 3  . diclofenac (VOLTAREN) 75 MG EC tablet TAKE 1 TABLET BY MOUTH TWICE DAILY 30 tablet 2  . fenofibrate micronized (ANTARA) 130 MG capsule Take 1 capsule (130 mg total) by mouth daily. 30 capsule 3  . FLUoxetine (PROZAC) 20 MG capsule TAKE ONE CAPSULE BY MOUTH ONCE DAILY 90 capsule 0  . fluticasone (FLONASE) 50 MCG/ACT nasal spray Place 2 sprays into both nostrils every morning. 16 g 6  . mupirocin ointment (BACTROBAN) 2 % APPLY TO NARES EVERY NIGHT AT BEDTIME AS NEEDED FOR LESIONS 22 g 1  . ranitidine (ZANTAC) 300 MG tablet TAKE ONE TABLET BY MOUTH ONCE DAILY AT BEDTIME 90 tablet 1  . cyclobenzaprine (FLEXERIL) 5 MG tablet Take 1 tablet (5 mg total) by  mouth 3 (three) times daily as needed for muscle spasms. 21 tablet 0  . hydrocortisone (ANUSOL-HC) 2.5 % rectal cream Place 1 application rectally 2 (two) times daily. 30 g 0   No current facility-administered medications for this visit.    Allergies  Allergen Reactions  . Crestor [Rosuvastatin]     myalgias   Social History   Social History  . Marital status: Married    Spouse name: Sharyn Lull  . Number of children: 2  . Years of education: N/A   Occupational History  . Landscape    Social History Main Topics  . Smoking status: Never Smoker  . Smokeless tobacco: Never Used  . Alcohol use 0.0 oz/week     Comment: twice monthly  . Drug use: No  . Sexual activity: Yes    Objective:  VITAL SIGNS: BP 110/82 (BP Location: Left Arm, Patient Position: Sitting, Cuff Size: Normal)   Pulse 75   Temp 97.9 F (36.6 C) (Oral)   Ht 5' 10.5" (1.791 m)   Wt 165 lb 9.6 oz (75.1 kg)   SpO2 99%   BMI 23.43 kg/m  Constitutional: Well formed, well developed. No acute distress. HENT: Normocephalic, atraumatic.  Moist mucous membranes.  Eyes:  EOM grossly intact. Conjunctiva normal.  Neck:  Full range of motion.   Cardiovascular: RRR, no murmurs Thorax & Lungs:  CTAB, no wheezing or rales  Extremities: No clubbing. No cyanosis. No edema.  Skin: Warm. Dry. No erythema. No rash.  Musculoskeletal: low back.   Tenderness to palpation: no Deformity: no Ecchymosis: no Straight leg test: negative for Poor ROM of hamstrings b/l Rectal: Sphincter of good tone. Large R sided hemorrhoid visualized. No internal hemorrhoids palpated.  Neurologic: Normal sensory function. No focal deficits noted. DTR's equal and symmetry in LE's. No clonus. Psychiatric: Normal mood. Age appropriate judgment and insight. Alert & oriented x 3.    Assessment:  External hemorrhoid - Plan: hydrocortisone (ANUSOL-HC) 2.5 % rectal cream  Acute right-sided low back pain without sciatica - Plan: cyclobenzaprine  (FLEXERIL) 5 MG tablet, DISCONTINUED: naproxen (NAPROSYN) 500 MG tablet  Plan: Orders as above. Flexeril, cont NSAID, home stretches and exercises given. Heat. Likely soft tissue injury, could be a ligament causing the issue in absence of TTP. F/u as originally scheduled with Dr. Charlett Blake.  The patient voiced understanding and agreement to the plan.   [place], DO 09/12/16  3:55 PM

## 2016-09-12 NOTE — Patient Instructions (Signed)
Let us know if anything changes.  EXERCISES  RANGE OF MOTION (ROM) AND STRETCHING EXERCISES - Low Back Sprain Most people with lower back pain will find that their symptoms get worse with excessive bending forward (flexion) or arching at the lower back (extension). The exercises that will help resolve your symptoms will focus on the opposite motion.  Your physician, physical therapist or athletic trainer will help you determine which exercises will be most helpful to resolve your lower back pain. Do not complete any exercises without first consulting with your caregiver. Discontinue any exercises which make your symptoms worse, until you speak to your caregiver. If you have pain, numbness or tingling which travels down into your buttocks, leg or foot, the goal of the therapy is for these symptoms to move closer to your back and eventually resolve. Sometimes, these leg symptoms will get better, but your lower back pain may worsen. This is often an indication of progress in your rehabilitation. Be very alert to any changes in your symptoms and the activities in which you participated in the 24 hours prior to the change. Sharing this information with your caregiver will allow him or her to most efficiently treat your condition. These exercises may help you when beginning to rehabilitate your injury. Your symptoms may resolve with or without further involvement from your physician, physical therapist or athletic trainer. While completing these exercises, remember:   Restoring tissue flexibility helps normal motion to return to the joints. This allows healthier, less painful movement and activity.  An effective stretch should be held for at least 30 seconds.  A stretch should never be painful. You should only feel a gentle lengthening or release in the stretched tissue. FLEXION RANGE OF MOTION AND STRETCHING EXERCISES:  STRETCH - Flexion, Single Knee to Chest   Lie on a firm bed or floor with both legs  extended in front of you.  Keeping one leg in contact with the floor, bring your opposite knee to your chest. Hold your leg in place by either grabbing behind your thigh or at your knee.  Pull until you feel a gentle stretch in your low back. Hold 15-20 seconds.  Slowly release your grasp and repeat the exercise with the opposite side. Repeat 2 times. Complete this exercise 1-2 times per day.   STRETCH - Flexion, Double Knee to Chest  Lie on a firm bed or floor with both legs extended in front of you.  Keeping one leg in contact with the floor, bring your opposite knee to your chest.  Tense your stomach muscles to support your back and then lift your other knee to your chest. Hold your legs in place by either grabbing behind your thighs or at your knees.  Pull both knees toward your chest until you feel a gentle stretch in your low back. Hold 15-20 seconds.  Tense your stomach muscles and slowly return one leg at a time to the floor. Repeat 2 times. Complete this exercise 1-2 times per day.   STRETCH - Low Trunk Rotation  Lie on a firm bed or floor. Keeping your legs in front of you, bend your knees so they are both pointed toward the ceiling and your feet are flat on the floor.  Extend your arms out to the side. This will stabilize your upper body by keeping your shoulders in contact with the floor.  Gently and slowly drop both knees together to one side until you feel a gentle stretch in your low back.  Hold for 15-20 seconds.  Tense your stomach muscles to support your lower back as you bring your knees back to the starting position. Repeat the exercise to the other side. Repeat 2 times. Complete this exercise 1-2 times per day  EXTENSION RANGE OF MOTION AND FLEXIBILITY EXERCISES:  STRETCH - Extension, Prone on Elbows   Lie on your stomach on the floor, a bed will be too soft. Place your palms about shoulder width apart and at the height of your head.  Place your elbows under  your shoulders. If this is too painful, stack pillows under your chest.  Allow your body to relax so that your hips drop lower and make contact more completely with the floor.  Hold this position for 15-20 seconds.  Slowly return to lying flat on the floor. Repeat 2 times. Complete this exercise 1-2 times per day.   RANGE OF MOTION - Extension, Prone Press Ups  Lie on your stomach on the floor, a bed will be too soft. Place your palms about shoulder width apart and at the height of your head.  Keeping your back as relaxed as possible, slowly straighten your elbows while keeping your hips on the floor. You may adjust the placement of your hands to maximize your comfort. As you gain motion, your hands will come more underneath your shoulders.  Hold this position 15-20 seconds.  Slowly return to lying flat on the floor. Repeat 2 times. Complete this exercise 1-2 times per day.   RANGE OF MOTION- Quadruped, Neutral Spine   Assume a hands and knees position on a firm surface. Keep your hands under your shoulders and your knees under your hips. You may place padding under your knees for comfort.  Drop your head and point your tailbone toward the ground below you. This will round out your lower back like an angry cat. Hold this position for 15-20 seconds.  Slowly lift your head and release your tail bone so that your back sags into a large arch, like an old horse.  Hold this position for 15-20 seconds.  Repeat this until you feel limber in your low back.  Now, find your "sweet spot." This will be the most comfortable position somewhere between the two previous positions. This is your neutral spine. Once you have found this position, tense your stomach muscles to support your low back.  Hold this position for 15-20 seconds. Repeat 2 times. Complete this exercise 1-2 times per day.  STRENGTHENING EXERCISES - Low Back Sprain These exercises may help you when beginning to rehabilitate your  injury. These exercises should be done near your "sweet spot." This is the neutral, low-back arch, somewhere between fully rounded and fully arched, that is your least painful position. When performed in this safe range of motion, these exercises can be used for people who have either a flexion or extension based injury. These exercises may resolve your symptoms with or without further involvement from your physician, physical therapist or athletic trainer. While completing these exercises, remember:   Muscles can gain both the endurance and the strength needed for everyday activities through controlled exercises.  Complete these exercises as instructed by your physician, physical therapist or athletic trainer. Increase the resistance and repetitions only as guided.  You may experience muscle soreness or fatigue, but the pain or discomfort you are trying to eliminate should never worsen during these exercises. If this pain does worsen, stop and make certain you are following the directions exactly. If the pain  is still present after adjustments, discontinue the exercise until you can discuss the trouble with your caregiver.  STRENGTHENING - Deep Abdominals, Pelvic Tilt   Lie on a firm bed or floor. Keeping your legs in front of you, bend your knees so they are both pointed toward the ceiling and your feet are flat on the floor.  Tense your lower abdominal muscles to press your low back into the floor. This motion will rotate your pelvis so that your tail bone is scooping upwards rather than pointing at your feet or into the floor. With a gentle tension and even breathing, hold this position for 10-15 seconds. Repeat 2 times. Complete this exercise 1 time per day.   STRENGTHENING - Abdominals, Crunches   Lie on a firm bed or floor. Keeping your legs in front of you, bend your knees so they are both pointed toward the ceiling and your feet are flat on the floor. Cross your arms over your  chest.  Slightly tip your chin down without bending your neck.  Tense your abdominals and slowly lift your trunk high enough to just clear your shoulder blades. Lifting higher can put excessive stress on the lower back and does not further strengthen your abdominal muscles.  Control your return to the starting position. Repeat 2 times. Complete this exercise once every 1-2 days.   STRENGTHENING - Quadruped, Opposite UE/LE Lift   Assume a hands and knees position on a firm surface. Keep your hands under your shoulders and your knees under your hips. You may place padding under your knees for comfort.  Find your neutral spine and gently tense your abdominal muscles so that you can maintain this position. Your shoulders and hips should form a rectangle that is parallel with the floor and is not twisted.  Keeping your trunk steady, lift your right hand no higher than your shoulder and then your left leg no higher than your hip. Make sure you are not holding your breath. Hold this position for 15-20 seconds.  Continuing to keep your abdominal muscles tense and your back steady, slowly return to your starting position. Repeat with the opposite arm and leg. Repeat 2 times. Complete this exercise once every 1-2 days.   STRENGTHENING - Abdominals and Quadriceps, Straight Leg Raise   Lie on a firm bed or floor with both legs extended in front of you.  Keeping one leg in contact with the floor, bend the other knee so that your foot can rest flat on the floor.  Find your neutral spine, and tense your abdominal muscles to maintain your spinal position throughout the exercise.  Slowly lift your straight leg off the floor about 6 inches for a count of 15, making sure to not hold your breath.  Still keeping your neutral spine, slowly lower your leg all the way to the floor. Repeat this exercise with each leg 2 times. Complete this exercise once every 1-2 days. POSTURE AND BODY MECHANICS  CONSIDERATIONS - Low Back Sprain Keeping correct posture when sitting, standing or completing your activities will reduce the stress put on different body tissues, allowing injured tissues a chance to heal and limiting painful experiences. The following are general guidelines for improved posture. Your physician or physical therapist will provide you with any instructions specific to your needs. While reading these guidelines, remember:  The exercises prescribed by your provider will help you have the flexibility and strength to maintain correct postures.  The correct posture provides the best environment for  your joints to work. All of your joints have less wear and tear when properly supported by a spine with good posture. This means you will experience a healthier, less painful body.  Correct posture must be practiced with all of your activities, especially prolonged sitting and standing. Correct posture is as important when doing repetitive low-stress activities (typing) as it is when doing a single heavy-load activity (lifting).  RESTING POSITIONS Consider which positions are most painful for you when choosing a resting position. If you have pain with flexion-based activities (sitting, bending, stooping, squatting), choose a position that allows you to rest in a less flexed posture. You would want to avoid curling into a fetal position on your side. If your pain worsens with extension-based activities (prolonged standing, working overhead), avoid resting in an extended position such as sleeping on your stomach. Most people will find more comfort when they rest with their spine in a more neutral position, neither too rounded nor too arched. Lying on a non-sagging bed on your side with a pillow between your knees, or on your back with a pillow under your knees will often provide some relief. Keep in mind, being in any one position for a prolonged period of time, no matter how correct your posture, can  still lead to stiffness. PROPER SITTING POSTURE In order to minimize stress and discomfort on your spine, you must sit with correct posture. Sitting with good posture should be effortless for a healthy body. Returning to good posture is a gradual process. Many people can work toward this most comfortably by using various supports until they have the flexibility and strength to maintain this posture on their own. When sitting with proper posture, your ears will fall over your shoulders and your shoulders will fall over your hips. You should use the back of the chair to support your upper back. Your lower back will be in a neutral position, just slightly arched. You may place a small pillow or folded towel at the base of your lower back for  support.  When working at a desk, create an environment that supports good, upright posture. Without extra support, muscles tire, which leads to excessive strain on joints and other tissues. Keep these recommendations in mind:  CHAIR:  A chair should be able to slide under your desk when your back makes contact with the back of the chair. This allows you to work closely.  The chair's height should allow your eyes to be level with the upper part of your monitor and your hands to be slightly lower than your elbows.  BODY POSITION  Your feet should make contact with the floor. If this is not possible, use a foot rest.  Keep your ears over your shoulders. This will reduce stress on your neck and low back.  INCORRECT SITTING POSTURES  If you are feeling tired and unable to assume a healthy sitting posture, do not slouch or slump. This puts excessive strain on your back tissues, causing more damage and pain. Healthier options include:  Using more support, like a lumbar pillow.  Switching tasks to something that requires you to be upright or walking.  Talking a brief walk.  Lying down to rest in a neutral-spine position.  PROLONGED STANDING WHILE SLIGHTLY  LEANING FORWARD  When completing a task that requires you to lean forward while standing in one place for a long time, place either foot up on a stationary 2-4 inch high object to help maintain the best posture.  When both feet are on the ground, the lower back tends to lose its slight inward curve. If this curve flattens (or becomes too large), then the back and your other joints will experience too much stress, tire more quickly, and can cause pain.  CORRECT STANDING POSTURES Proper standing posture should be assumed with all daily activities, even if they only take a few moments, like when brushing your teeth. As in sitting, your ears should fall over your shoulders and your shoulders should fall over your hips. You should keep a slight tension in your abdominal muscles to brace your spine. Your tailbone should point down to the ground, not behind your body, resulting in an over-extended swayback posture.   INCORRECT STANDING POSTURES  Common incorrect standing postures include a forward head, locked knees and/or an excessive swayback. WALKING Walk with an upright posture. Your ears, shoulders and hips should all line-up.  PROLONGED ACTIVITY IN A FLEXED POSITION When completing a task that requires you to bend forward at your waist or lean over a low surface, try to find a way to stabilize 3 out of 4 of your limbs. You can place a hand or elbow on your thigh or rest a knee on the surface you are reaching across. This will provide you more stability, so that your muscles do not tire as quickly. By keeping your knees relaxed, or slightly bent, you will also reduce stress across your lower back. CORRECT LIFTING TECHNIQUES  DO :  Assume a wide stance. This will provide you more stability and the opportunity to get as close as possible to the object which you are lifting.  Tense your abdominals to brace your spine. Bend at the knees and hips. Keeping your back locked in a neutral-spine position, lift  using your leg muscles. Lift with your legs, keeping your back straight.  Test the weight of unknown objects before attempting to lift them.  Try to keep your elbows locked down at your sides in order get the best strength from your shoulders when carrying an object.     Always ask for help when lifting heavy or awkward objects. INCORRECT LIFTING TECHNIQUES DO NOT:   Lock your knees when lifting, even if it is a small object.  Bend and twist. Pivot at your feet or move your feet when needing to change directions.  Assume that you can safely pick up even a paperclip without proper posture.

## 2016-09-23 ENCOUNTER — Other Ambulatory Visit: Payer: Self-pay | Admitting: Family Medicine

## 2016-11-07 DIAGNOSIS — L821 Other seborrheic keratosis: Secondary | ICD-10-CM | POA: Diagnosis not present

## 2016-11-07 DIAGNOSIS — L570 Actinic keratosis: Secondary | ICD-10-CM | POA: Diagnosis not present

## 2016-11-07 DIAGNOSIS — Z85828 Personal history of other malignant neoplasm of skin: Secondary | ICD-10-CM | POA: Diagnosis not present

## 2016-11-07 DIAGNOSIS — Z85820 Personal history of malignant melanoma of skin: Secondary | ICD-10-CM | POA: Diagnosis not present

## 2016-11-07 DIAGNOSIS — D229 Melanocytic nevi, unspecified: Secondary | ICD-10-CM | POA: Diagnosis not present

## 2016-12-01 ENCOUNTER — Other Ambulatory Visit: Payer: Self-pay | Admitting: Family Medicine

## 2016-12-01 DIAGNOSIS — M545 Low back pain, unspecified: Secondary | ICD-10-CM

## 2016-12-01 DIAGNOSIS — F418 Other specified anxiety disorders: Secondary | ICD-10-CM

## 2016-12-01 DIAGNOSIS — S0101XA Laceration without foreign body of scalp, initial encounter: Secondary | ICD-10-CM

## 2016-12-01 DIAGNOSIS — K649 Unspecified hemorrhoids: Secondary | ICD-10-CM

## 2016-12-01 DIAGNOSIS — F329 Major depressive disorder, single episode, unspecified: Secondary | ICD-10-CM

## 2016-12-01 DIAGNOSIS — E785 Hyperlipidemia, unspecified: Secondary | ICD-10-CM

## 2016-12-01 DIAGNOSIS — D72829 Elevated white blood cell count, unspecified: Secondary | ICD-10-CM

## 2016-12-01 DIAGNOSIS — W57XXXA Bitten or stung by nonvenomous insect and other nonvenomous arthropods, initial encounter: Secondary | ICD-10-CM

## 2016-12-01 DIAGNOSIS — F32A Depression, unspecified: Secondary | ICD-10-CM

## 2016-12-02 NOTE — Telephone Encounter (Signed)
Pt needs appt first/thx dmf

## 2016-12-09 ENCOUNTER — Telehealth: Payer: Self-pay | Admitting: *Deleted

## 2016-12-09 NOTE — Telephone Encounter (Signed)
Per Dr. Charlett Blake, patient must have OV prior to completion of FMLA paperwork [for spousal care]; Please call patient and schedule OV per provider instructions, Thanks/SLS 09/17

## 2016-12-09 NOTE — Telephone Encounter (Signed)
Patient scheduled with PCP for [facility] 12/30/2016

## 2016-12-09 NOTE — Telephone Encounter (Signed)
Wife brought in [employer] paperwork to be completed for her care for her spouse, with a blank and a completed form [by her for suggestion]. Patient has not bee seen since 08/29/15 by PCP; informed and forwarded paperwork to provider's assistant, Princess; as Spouse has appointment at 8:15am this morning/SLS 09/17

## 2016-12-15 ENCOUNTER — Other Ambulatory Visit: Payer: Self-pay | Admitting: Family Medicine

## 2016-12-15 DIAGNOSIS — E785 Hyperlipidemia, unspecified: Secondary | ICD-10-CM

## 2016-12-15 DIAGNOSIS — F418 Other specified anxiety disorders: Secondary | ICD-10-CM

## 2016-12-15 DIAGNOSIS — F329 Major depressive disorder, single episode, unspecified: Secondary | ICD-10-CM

## 2016-12-15 DIAGNOSIS — W57XXXA Bitten or stung by nonvenomous insect and other nonvenomous arthropods, initial encounter: Secondary | ICD-10-CM

## 2016-12-15 DIAGNOSIS — M545 Low back pain, unspecified: Secondary | ICD-10-CM

## 2016-12-15 DIAGNOSIS — D72829 Elevated white blood cell count, unspecified: Secondary | ICD-10-CM

## 2016-12-15 DIAGNOSIS — K649 Unspecified hemorrhoids: Secondary | ICD-10-CM

## 2016-12-15 DIAGNOSIS — S0101XA Laceration without foreign body of scalp, initial encounter: Secondary | ICD-10-CM

## 2016-12-15 DIAGNOSIS — F32A Depression, unspecified: Secondary | ICD-10-CM

## 2016-12-27 DIAGNOSIS — Z85820 Personal history of malignant melanoma of skin: Secondary | ICD-10-CM | POA: Diagnosis not present

## 2016-12-27 DIAGNOSIS — L570 Actinic keratosis: Secondary | ICD-10-CM | POA: Diagnosis not present

## 2016-12-27 DIAGNOSIS — C44722 Squamous cell carcinoma of skin of right lower limb, including hip: Secondary | ICD-10-CM | POA: Diagnosis not present

## 2016-12-27 DIAGNOSIS — D489 Neoplasm of uncertain behavior, unspecified: Secondary | ICD-10-CM | POA: Diagnosis not present

## 2016-12-27 DIAGNOSIS — Z85828 Personal history of other malignant neoplasm of skin: Secondary | ICD-10-CM | POA: Diagnosis not present

## 2016-12-30 ENCOUNTER — Ambulatory Visit: Payer: Medicare Other | Admitting: Family Medicine

## 2017-01-16 DIAGNOSIS — C44722 Squamous cell carcinoma of skin of right lower limb, including hip: Secondary | ICD-10-CM | POA: Diagnosis not present

## 2017-03-20 ENCOUNTER — Other Ambulatory Visit: Payer: Self-pay | Admitting: Family Medicine

## 2017-03-20 DIAGNOSIS — F329 Major depressive disorder, single episode, unspecified: Secondary | ICD-10-CM

## 2017-03-20 DIAGNOSIS — D72829 Elevated white blood cell count, unspecified: Secondary | ICD-10-CM

## 2017-03-20 DIAGNOSIS — K649 Unspecified hemorrhoids: Secondary | ICD-10-CM

## 2017-03-20 DIAGNOSIS — F32A Depression, unspecified: Secondary | ICD-10-CM

## 2017-03-20 DIAGNOSIS — F418 Other specified anxiety disorders: Secondary | ICD-10-CM

## 2017-03-20 DIAGNOSIS — M545 Low back pain, unspecified: Secondary | ICD-10-CM

## 2017-03-20 DIAGNOSIS — S0101XA Laceration without foreign body of scalp, initial encounter: Secondary | ICD-10-CM

## 2017-03-20 DIAGNOSIS — W57XXXA Bitten or stung by nonvenomous insect and other nonvenomous arthropods, initial encounter: Secondary | ICD-10-CM

## 2017-03-20 DIAGNOSIS — E785 Hyperlipidemia, unspecified: Secondary | ICD-10-CM

## 2017-05-06 DIAGNOSIS — Z85828 Personal history of other malignant neoplasm of skin: Secondary | ICD-10-CM | POA: Diagnosis not present

## 2017-05-06 DIAGNOSIS — L570 Actinic keratosis: Secondary | ICD-10-CM | POA: Diagnosis not present

## 2017-05-06 DIAGNOSIS — Z85820 Personal history of malignant melanoma of skin: Secondary | ICD-10-CM | POA: Diagnosis not present

## 2017-05-06 DIAGNOSIS — D0461 Carcinoma in situ of skin of right upper limb, including shoulder: Secondary | ICD-10-CM | POA: Diagnosis not present

## 2017-05-06 DIAGNOSIS — H6092 Unspecified otitis externa, left ear: Secondary | ICD-10-CM | POA: Diagnosis not present

## 2017-05-06 DIAGNOSIS — H61002 Unspecified perichondritis of left external ear: Secondary | ICD-10-CM | POA: Diagnosis not present

## 2017-05-06 DIAGNOSIS — C44722 Squamous cell carcinoma of skin of right lower limb, including hip: Secondary | ICD-10-CM | POA: Diagnosis not present

## 2017-05-06 DIAGNOSIS — D489 Neoplasm of uncertain behavior, unspecified: Secondary | ICD-10-CM | POA: Diagnosis not present

## 2017-05-14 ENCOUNTER — Other Ambulatory Visit: Payer: Self-pay | Admitting: Family Medicine

## 2017-05-14 DIAGNOSIS — K649 Unspecified hemorrhoids: Secondary | ICD-10-CM

## 2017-05-14 DIAGNOSIS — W57XXXA Bitten or stung by nonvenomous insect and other nonvenomous arthropods, initial encounter: Secondary | ICD-10-CM

## 2017-05-14 DIAGNOSIS — D0461 Carcinoma in situ of skin of right upper limb, including shoulder: Secondary | ICD-10-CM | POA: Diagnosis not present

## 2017-05-14 DIAGNOSIS — F418 Other specified anxiety disorders: Secondary | ICD-10-CM

## 2017-05-14 DIAGNOSIS — C44722 Squamous cell carcinoma of skin of right lower limb, including hip: Secondary | ICD-10-CM | POA: Diagnosis not present

## 2017-05-14 DIAGNOSIS — F32A Depression, unspecified: Secondary | ICD-10-CM

## 2017-05-14 DIAGNOSIS — E785 Hyperlipidemia, unspecified: Secondary | ICD-10-CM

## 2017-05-14 DIAGNOSIS — D72829 Elevated white blood cell count, unspecified: Secondary | ICD-10-CM

## 2017-05-14 DIAGNOSIS — S0101XA Laceration without foreign body of scalp, initial encounter: Secondary | ICD-10-CM

## 2017-05-14 DIAGNOSIS — Z85820 Personal history of malignant melanoma of skin: Secondary | ICD-10-CM | POA: Diagnosis not present

## 2017-05-14 DIAGNOSIS — M545 Low back pain, unspecified: Secondary | ICD-10-CM

## 2017-05-14 DIAGNOSIS — F329 Major depressive disorder, single episode, unspecified: Secondary | ICD-10-CM

## 2017-05-29 NOTE — Progress Notes (Signed)
Subjective:   Aaron Dyer is a 52 y.o. male who presents for an Initial Medicare Annual Wellness Visit.  Review of Systems No ROS.  Medicare Wellness Visit. Additional risk factors are reflected in the social history.  Cardiac Risk Factors include: advanced age (>33men, >104 women);dyslipidemia;male gender;sedentary lifestyle Sleep patterns: Sleeps at least 8 hrs and naps Home Safety/Smoke Alarms: Feels safe in home. Smoke alarms in place.  Living environment; residence and Adult nurse: Engineer, maintenance. Lives with wife and 5 dogs.  Seat Belt Safety/Bike Helmet: Wears seat belt.  Male:   CCS- Last 11/10/14.  Recall 5 yrs PSA-  Lab Results  Component Value Date   PSA 0.48 04/13/2015   PSA 0.42 11/25/2013   PSA 0.47 04/29/2011      Objective:    Today's Vitals   06/03/17 0936  BP: 124/84  Pulse: 76  SpO2: 98%  Weight: 165 lb 3.2 oz (74.9 kg)  Height: 5\' 11"  (1.803 m)   Body mass index is 23.04 kg/m.  Advanced Directives 06/03/2017 09/01/2016 11/10/2014 10/27/2014  Does Patient Have a Medical Advance Directive? No No No No  Would patient like information on creating a medical advance directive? No - Patient declined - No - patient declined information No - patient declined information    Current Medications (verified) Outpatient Encounter Medications as of 06/03/2017  Medication Sig  . aspirin 81 MG tablet Take 81 mg by mouth daily.  Marland Kitchen buPROPion (WELLBUTRIN XL) 300 MG 24 hr tablet Take 1 tablet (300 mg total) by mouth daily.  . cyclobenzaprine (FLEXERIL) 5 MG tablet Take 1 tablet (5 mg total) by mouth 3 (three) times daily as needed for muscle spasms.  Marland Kitchen FLUoxetine (PROZAC) 20 MG capsule TAKE 1 CAPSULE BY MOUTH ONCE DAILY  . fluticasone (FLONASE) 50 MCG/ACT nasal spray Place 2 sprays into both nostrils every morning.  . hydrocortisone (ANUSOL-HC) 2.5 % rectal cream Place 1 application rectally 2 (two) times daily.  . mupirocin ointment (BACTROBAN) 2 % APPLY TO NARES EVERY  NIGHT AT BEDTIME AS NEEDED FOR LESIONS  . ranitidine (ZANTAC) 300 MG tablet TAKE 1 TABLET BY MOUTH AT BEDTIME  . diclofenac (VOLTAREN) 75 MG EC tablet TAKE 1 TABLET BY MOUTH TWICE DAILY (Patient not taking: Reported on 06/03/2017)  . fenofibrate micronized (ANTARA) 130 MG capsule Take 1 capsule (130 mg total) by mouth daily. (Patient not taking: Reported on 06/03/2017)   No facility-administered encounter medications on file as of 06/03/2017.     Allergies (verified) Crestor [rosuvastatin]   History: Past Medical History:  Diagnosis Date  . Anxiety and depression   . Back pain 03/15/2013  . Cancer (HCC)    face, arms, legs, and ear, SCC  . Chicken pox as a child  . Colon polyps 05/28/2011  . Constipation 09/08/2015  . Depression   . Diverticulosis of colon without hemorrhage 12/18/2014  . ED (erectile dysfunction) 08/22/2013  . Elevated WBCs 04/10/2014  . GERD (gastroesophageal reflux disease)   . Hematuria 04/30/2011  . Hemorrhoids 04/29/2011  . Hyperlipidemia   . IBS (irritable bowel syndrome)   . Insomnia 05/28/2011  . Laceration of skin of scalp 09/05/2012   right  . Lipoma 04/21/2012   On back near left shoulder blade and above right hip  . Mumps as a child  . Rectal bleeding 04/29/2011  . Skin cancer    face, arms, legs, and ear skin cancer Followed with surgeon who has retired per patient, has had 2 Mohs procedures on his  right ear and 2 lesions excised, one form each leg.   . Sun-damaged skin 04/21/2012  . Tubular adenoma of colon   . Urinary frequency 04/30/2011  . Urinary hesitancy 04/30/2011   Past Surgical History:  Procedure Laterality Date  . MOHS SURGERY  2010   right ear, twice  . remove squamous cell     b/l legs   Family History  Problem Relation Age of Onset  . COPD Mother   . Emphysema Mother   . Stroke Mother        X 2  . Heart disease Mother        CABG in her 47s  . Hyperlipidemia Mother   . Hypertension Mother   . Diabetes Mother        type 2  . Cancer  Father        melanoma  . Stroke Father        88  . Hypertension Father   . Alcohol abuse Father   . Heart disease Father   . Diabetes Maternal Grandmother        type 2  . Heart disease Maternal Grandmother        CHF  . Heart attack Maternal Grandfather   . Heart disease Paternal Grandmother   . Heart disease Paternal Grandfather   . Colon cancer Neg Hx   . Colon polyps Neg Hx    Social History   Socioeconomic History  . Marital status: Married    Spouse name: Sharyn Lull  . Number of children: 2  . Years of education: None  . Highest education level: None  Social Needs  . Financial resource strain: None  . Food insecurity - worry: None  . Food insecurity - inability: None  . Transportation needs - medical: None  . Transportation needs - non-medical: None  Occupational History  . Occupation: Landscape  Tobacco Use  . Smoking status: Never Smoker  . Smokeless tobacco: Never Used  Substance and Sexual Activity  . Alcohol use: Yes    Alcohol/week: 0.0 oz    Frequency: Never    Comment: twice monthly  . Drug use: No  . Sexual activity: Yes  Other Topics Concern  . None  Social History Narrative  . None   Tobacco Counseling Counseling given: Not Answered   Clinical Intake: Pain : No/denies pain  Activities of Daily Living In your present state of health, do you have any difficulty performing the following activities: 06/03/2017  Hearing? N  Vision? N  Comment wears glasses for reading  Difficulty concentrating or making decisions? Y  Comment Pt states he has always had a bad memory  Walking or climbing stairs? N  Dressing or bathing? N  Doing errands, shopping? N  Preparing Food and eating ? N  Using the Toilet? N  In the past six months, have you accidently leaked urine? N  Do you have problems with loss of bowel control? N  Managing your Medications? N  Managing your Finances? N  Housekeeping or managing your Housekeeping? N  Some recent data  might be hidden     Immunizations and Health Maintenance Immunization History  Administered Date(s) Administered  . Td 03/25/2005  . Tdap 05/29/2015   Health Maintenance Due  Topic Date Due  . HIV Screening  01/22/1980    Patient Care Team: Mosie Lukes, MD as PCP - General (Family Medicine)  Indicate any recent Medical Services you may have received from other than [facility] providers in  the past year (date may be approximate).    Assessment:   This is a routine wellness examination for Thelmer. Physical assessment deferred to PCP.  Hearing/Vision screen  Visual Acuity Screening   Right eye Left eye Both eyes  Without correction: 20/20 20/20 20/20   With correction:     Hearing Screening Comments: Able to hear conversational tones w/o difficulty. No issues reported.    Dietary issues and exercise activities discussed: Current Exercise Habits: The patient does not participate in regular exercise at present, Exercise limited by: None identified   Diet (meal preparation, eat out, water intake, caffeinated beverages, dairy products, fruits and vegetables): in general, an "unhealthy" diet, on average, 1 meals per day  Encouraged to eat more fruits and vegetables and drink water.      Goals    . DIET - EAT MORE FRUITS AND VEGETABLES    . DIET - INCREASE WATER INTAKE      Depression Screen PHQ 2/9 Scores 06/03/2017  PHQ - 2 Score 6  PHQ- 9 Score 13    Fall Risk Fall Risk  06/03/2017  Falls in the past year? No     Cognitive Function: MMSE - Mini Mental State Exam 06/03/2017  Orientation to time 5  Orientation to Place 5  Registration 3  Attention/ Calculation 5  Recall 1  Language- name 2 objects 2  Language- repeat 1  Language- follow 3 step command 3  Language- read & follow direction 1  Write a sentence 1  Copy design 1  Total score 28        Screening Tests Health Maintenance  Topic Date Due  . HIV Screening  01/22/1981  . INFLUENZA VACCINE   02/03/2018 (Originally 10/23/2016)  . COLONOSCOPY  11/10/2019  . TETANUS/TDAP  05/28/2025      Plan:   Follow up with Dr.Blyth today as scheduled.  Eat heart healthy diet (full of fruits, vegetables, whole grains, lean protein, water--limit salt, fat, and sugar intake) and increase physical activity as tolerated.  Continue doing brain stimulating activities (puzzles, reading, adult coloring books, staying active) to keep memory sharp.     I have personally reviewed and noted the following in the patient's chart:   . Medical and social history . Use of alcohol, tobacco or illicit drugs  . Current medications and supplements . Functional ability and status . Nutritional status . Physical activity . Advanced directives . List of other physicians . Hospitalizations, surgeries, and ER visits in previous 12 months . Vitals . Screenings to include cognitive, depression, and falls . Referrals and appointments  In addition, I have reviewed and discussed with patient certain preventive protocols, quality metrics, and best practice recommendations. A written personalized care plan for preventive services as well as general preventive health recommendations were provided to patient.     Shela Nevin, [place]   06/03/2017

## 2017-06-03 ENCOUNTER — Encounter: Payer: Self-pay | Admitting: Family Medicine

## 2017-06-03 ENCOUNTER — Encounter: Payer: Self-pay | Admitting: *Deleted

## 2017-06-03 ENCOUNTER — Ambulatory Visit (INDEPENDENT_AMBULATORY_CARE_PROVIDER_SITE_OTHER): Payer: BLUE CROSS/BLUE SHIELD | Admitting: Family Medicine

## 2017-06-03 ENCOUNTER — Ambulatory Visit (INDEPENDENT_AMBULATORY_CARE_PROVIDER_SITE_OTHER): Payer: BLUE CROSS/BLUE SHIELD | Admitting: *Deleted

## 2017-06-03 VITALS — BP 124/84 | HR 76 | Resp 18 | Ht 71.0 in | Wt 165.0 lb

## 2017-06-03 VITALS — BP 124/84 | HR 76 | Ht 71.0 in | Wt 165.2 lb

## 2017-06-03 DIAGNOSIS — E785 Hyperlipidemia, unspecified: Secondary | ICD-10-CM | POA: Diagnosis not present

## 2017-06-03 DIAGNOSIS — R61 Generalized hyperhidrosis: Secondary | ICD-10-CM | POA: Diagnosis not present

## 2017-06-03 DIAGNOSIS — Z0000 Encounter for general adult medical examination without abnormal findings: Secondary | ICD-10-CM

## 2017-06-03 DIAGNOSIS — D649 Anemia, unspecified: Secondary | ICD-10-CM

## 2017-06-03 DIAGNOSIS — M791 Myalgia, unspecified site: Secondary | ICD-10-CM | POA: Diagnosis not present

## 2017-06-03 DIAGNOSIS — R194 Change in bowel habit: Secondary | ICD-10-CM

## 2017-06-03 DIAGNOSIS — F419 Anxiety disorder, unspecified: Secondary | ICD-10-CM | POA: Diagnosis not present

## 2017-06-03 DIAGNOSIS — F329 Major depressive disorder, single episode, unspecified: Secondary | ICD-10-CM

## 2017-06-03 DIAGNOSIS — K921 Melena: Secondary | ICD-10-CM | POA: Diagnosis not present

## 2017-06-03 DIAGNOSIS — F32A Depression, unspecified: Secondary | ICD-10-CM

## 2017-06-03 LAB — CBC WITH DIFFERENTIAL/PLATELET
BASOS ABS: 0.1 10*3/uL (ref 0.0–0.1)
Basophils Relative: 0.7 % (ref 0.0–3.0)
EOS ABS: 0.1 10*3/uL (ref 0.0–0.7)
Eosinophils Relative: 1.4 % (ref 0.0–5.0)
HCT: 43.9 % (ref 39.0–52.0)
Hemoglobin: 15.2 g/dL (ref 13.0–17.0)
LYMPHS ABS: 3 10*3/uL (ref 0.7–4.0)
LYMPHS PCT: 30.4 % (ref 12.0–46.0)
MCHC: 34.6 g/dL (ref 30.0–36.0)
MCV: 93.7 fl (ref 78.0–100.0)
Monocytes Absolute: 0.6 10*3/uL (ref 0.1–1.0)
Monocytes Relative: 5.8 % (ref 3.0–12.0)
NEUTROS ABS: 6 10*3/uL (ref 1.4–7.7)
NEUTROS PCT: 61.7 % (ref 43.0–77.0)
PLATELETS: 286 10*3/uL (ref 150.0–400.0)
RBC: 4.68 Mil/uL (ref 4.22–5.81)
RDW: 14.2 % (ref 11.5–15.5)
WBC: 9.8 10*3/uL (ref 4.0–10.5)

## 2017-06-03 LAB — LIPID PANEL
CHOL/HDL RATIO: 6
Cholesterol: 252 mg/dL — ABNORMAL HIGH (ref 0–200)
HDL: 41.6 mg/dL (ref >39.00)
LDL Cholesterol: 183 mg/dL — ABNORMAL HIGH (ref 0–99)
NONHDL: 210.4
Triglycerides: 136 mg/dL (ref 0.0–149.0)
VLDL: 27.2 mg/dL (ref 0.0–40.0)

## 2017-06-03 LAB — URINALYSIS
BILIRUBIN URINE: NEGATIVE
Ketones, ur: NEGATIVE
Leukocytes, UA: NEGATIVE
NITRITE: NEGATIVE
PH: 7 (ref 5.0–8.0)
SPECIFIC GRAVITY, URINE: 1.01 (ref 1.000–1.030)
Total Protein, Urine: NEGATIVE
Urine Glucose: NEGATIVE
Urobilinogen, UA: 0.2 (ref 0.0–1.0)

## 2017-06-03 LAB — COMPREHENSIVE METABOLIC PANEL
ALT: 22 U/L (ref 0–53)
AST: 16 U/L (ref 0–37)
Albumin: 4.6 g/dL (ref 3.5–5.2)
Alkaline Phosphatase: 72 U/L (ref 39–117)
BILIRUBIN TOTAL: 0.4 mg/dL (ref 0.2–1.2)
BUN: 14 mg/dL (ref 6–23)
CO2: 31 meq/L (ref 19–32)
CREATININE: 0.87 mg/dL (ref 0.40–1.50)
Calcium: 9.8 mg/dL (ref 8.4–10.5)
Chloride: 100 mEq/L (ref 96–112)
GFR: 97.8 mL/min (ref >60.00)
GLUCOSE: 90 mg/dL (ref 70–99)
Potassium: 4.1 mEq/L (ref 3.5–5.1)
Sodium: 136 mEq/L (ref 135–145)
Total Protein: 7.3 g/dL (ref 6.0–8.3)

## 2017-06-03 LAB — MAGNESIUM: Magnesium: 2.3 mg/dL (ref 1.5–2.5)

## 2017-06-03 LAB — TSH: TSH: 2.56 u[IU]/mL (ref 0.35–4.50)

## 2017-06-03 MED ORDER — HYDROCORTISONE ACETATE 25 MG RE SUPP: 25.0000 mg | Freq: Every evening | RECTAL | 1 refills | Status: DC | PRN

## 2017-06-03 NOTE — Patient Instructions (Signed)
Eat heart healthy diet (full of fruits, vegetables, whole grains, lean protein, water--limit salt, fat, and sugar intake) and increase physical activity as tolerated.  Continue doing brain stimulating activities (puzzles, reading, adult coloring books, staying active) to keep memory sharp.    Aaron Dyer , Thank you for taking time to come for your Medicare Wellness Visit. I appreciate your ongoing commitment to your health goals. Please review the following plan we discussed and let me know if I can assist you in the future.   These are the goals we discussed: Goals    . DIET - EAT MORE FRUITS AND VEGETABLES    . DIET - INCREASE WATER INTAKE       This is a list of the screening recommended for you and due dates:  Health Maintenance  Topic Date Due  . HIV Screening  01/22/1980  . Flu Shot  02/03/2018*  . Colon Cancer Screening  11/10/2019  . Tetanus Vaccine  05/28/2025  *Topic was postponed. The date shown is not the original due date.    Health Maintenance, Male A healthy lifestyle and preventive care is important for your health and wellness. Ask your health care provider about what schedule of regular examinations is right for you. What should I know about weight and diet? Eat a Healthy Diet  Eat plenty of vegetables, fruits, whole grains, low-fat dairy products, and lean protein.  Do not eat a lot of foods high in solid fats, added sugars, or salt.  Maintain a Healthy Weight Regular exercise can help you achieve or maintain a healthy weight. You should:  Do at least 150 minutes of exercise each week. The exercise should increase your heart rate and make you sweat (moderate-intensity exercise).  Do strength-training exercises at least twice a week.  Watch Your Levels of Cholesterol and Blood Lipids  Have your blood tested for lipids and cholesterol every 5 years starting at 53 years of age. If you are at high risk for heart disease, you should start having your blood tested  when you are 53 years old. You may need to have your cholesterol levels checked more often if: ? Your lipid or cholesterol levels are high. ? You are older than 53 years of age. ? You are at high risk for heart disease.  What should I know about cancer screening? Many types of cancers can be detected early and may often be prevented. Lung Cancer  You should be screened every year for lung cancer if: ? You are a current smoker who has smoked for at least 30 years. ? You are a former smoker who has quit within the past 15 years.  Talk to your health care provider about your screening options, when you should start screening, and how often you should be screened.  Colorectal Cancer  Routine colorectal cancer screening usually begins at 53 years of age and should be repeated every 5-10 years until you are 53 years old. You may need to be screened more often if early forms of precancerous polyps or small growths are found. Your health care provider may recommend screening at an earlier age if you have risk factors for colon cancer.  Your health care provider may recommend using home test kits to check for hidden blood in the stool.  A small camera at the end of a tube can be used to examine your colon (sigmoidoscopy or colonoscopy). This checks for the earliest forms of colorectal cancer.  Prostate and Testicular Cancer  Depending  on your age and overall health, your health care provider may do certain tests to screen for prostate and testicular cancer.  Talk to your health care provider about any symptoms or concerns you have about testicular or prostate cancer.  Skin Cancer  Check your skin from head to toe regularly.  Tell your health care provider about any new moles or changes in moles, especially if: ? There is a change in a mole's size, shape, or color. ? You have a mole that is larger than a pencil eraser.  Always use sunscreen. Apply sunscreen liberally and repeat throughout  the day.  Protect yourself by wearing long sleeves, pants, a wide-brimmed hat, and sunglasses when outside.  What should I know about heart disease, diabetes, and high blood pressure?  If you are 49-66 years of age, have your blood pressure checked every 3-5 years. If you are 80 years of age or older, have your blood pressure checked every year. You should have your blood pressure measured twice-once when you are at a hospital or clinic, and once when you are not at a hospital or clinic. Record the average of the two measurements. To check your blood pressure when you are not at a hospital or clinic, you can use: ? An automated blood pressure machine at a pharmacy. ? A home blood pressure monitor.  Talk to your health care provider about your target blood pressure.  If you are between 28-2 years old, ask your health care provider if you should take aspirin to prevent heart disease.  Have regular diabetes screenings by checking your fasting blood sugar level. ? If you are at a normal weight and have a low risk for diabetes, have this test once every three years after the age of 7. ? If you are overweight and have a high risk for diabetes, consider being tested at a younger age or more often.  A one-time screening for abdominal aortic aneurysm (AAA) by ultrasound is recommended for men aged 20-75 years who are current or former smokers. What should I know about preventing infection? Hepatitis B If you have a higher risk for hepatitis B, you should be screened for this virus. Talk with your health care provider to find out if you are at risk for hepatitis B infection. Hepatitis C Blood testing is recommended for:  Everyone born from [birth year] through 1966.  Anyone with known risk factors for hepatitis C.  Sexually Transmitted Diseases (STDs)  You should be screened each year for STDs including gonorrhea and chlamydia if: ? You are sexually active and are younger than 53 years of age. ? You  are older than 53 years of age and your health care provider tells you that you are at risk for this type of infection. ? Your sexual activity has changed since you were last screened and you are at an increased risk for chlamydia or gonorrhea. Ask your health care provider if you are at risk.  Talk with your health care provider about whether you are at high risk of being infected with HIV. Your health care provider may recommend a prescription medicine to help prevent HIV infection.  What else can I do?  Schedule regular health, dental, and eye exams.  Stay current with your vaccines (immunizations).  Do not use any tobacco products, such as cigarettes, chewing tobacco, and e-cigarettes. If you need help quitting, ask your health care provider.  Limit alcohol intake to no more than 2 drinks per day. One drink equals  12 ounces of beer, 5 ounces of wine, or 1 ounces of hard liquor.  Do not use street drugs.  Do not share needles.  Ask your health care provider for help if you need support or information about quitting drugs.  Tell your health care provider if you often feel depressed.  Tell your health care provider if you have ever been abused or do not feel safe at home. This information is not intended to replace advice given to you by your health care provider. Make sure you discuss any questions you have with your health care provider. Document Released: 09/07/2007 Document Revised: 11/08/2015 Document Reviewed: 12/13/2014 Elsevier Interactive Patient Education  2018 [employer].  Constipation, Adult Constipation is when a person:  Poops (has a bowel movement) fewer times in a week than normal.  Has a hard time pooping.  Has poop that is dry, hard, or bigger than normal.  Follow these instructions at home: Eating and drinking   Eat foods that have a lot of fiber, such as: ? Fresh fruits and vegetables. ? Whole grains. ? Beans.  Eat less of foods that are high in  fat, low in fiber, or overly processed, such as: ? [place] fries. ? Hamburgers. ? Cookies. ? Candy. ? Soda.  Drink enough fluid to keep your pee (urine) clear or pale yellow. General instructions  Exercise regularly or as told by your doctor.  Go to the restroom when you feel like you need to poop. Do not hold it in.  Take over-the-counter and prescription medicines only as told by your doctor. These include any fiber supplements.  Do pelvic floor retraining exercises, such as: ? Doing deep breathing while relaxing your lower belly (abdomen). ? Relaxing your pelvic floor while pooping.  Watch your condition for any changes.  Keep all follow-up visits as told by your doctor. This is important. Contact a doctor if:  You have pain that gets worse.  You have a fever.  You have not pooped for 4 days.  You throw up (vomit).  You are not hungry.  You lose weight.  You are bleeding from the anus.  You have thin, pencil-like poop (stool). Get help right away if:  You have a fever, and your symptoms suddenly get worse.  You leak poop or have blood in your poop.  Your belly feels hard or bigger than normal (is bloated).  You have very bad belly pain.  You feel dizzy or you faint. This information is not intended to replace advice given to you by your health care provider. Make sure you discuss any questions you have with your health care provider. Document Released: 08/28/2007 Document Revised: 09/29/2015 Document Reviewed: 08/30/2015 Elsevier Interactive Patient Education  2018 [employer].

## 2017-06-03 NOTE — Assessment & Plan Note (Signed)
Increase hydration and check labs

## 2017-06-03 NOTE — Progress Notes (Signed)
Subjective:  I acted as a Education administrator for Dr. Charlett Blake. [place], [place]  Patient ID: Aaron Dyer, male    DOB: 1965-11-24, 52 y.o.   MRN: [id number]  No chief complaint on file.   HPI  Patient is in today for an annual exam and follow up on chronic medical concerns including hyperlipidemia, reflux, IBS, depression and anxiety. He is accompanied by his wife. They report he is managing his grief over the loss of his mother well most days. He does acknowledge some anhedonia, depression and anxiety still but denies any suicidal ideation. He is noting a recent onset of night sweats.this week. He has been noting blood in his stool, bright red over the past year and recently it has increased in frequency to 2-3 x a week lately. No abdominal pain, anorexia, n/v. Moves his bowels every 1-2 days. He is doing well with ADLs. No recent hospitalization or febrile illness. 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 This April will be 5 years since his melanoma excision. Denies CP/palp/SOB/HA/congestion/fevers/GI or GU c/o. Taking meds as prescribed  Patient Care Team: Mosie Lukes, MD as PCP - General (Family Medicine)   Past Medical History:  Diagnosis Date  . Anxiety and depression   . Back pain 03/15/2013  . Cancer (HCC)    face, arms, legs, and ear, SCC  . Chicken pox as a child  . Colon polyps 05/28/2011  . Constipation 09/08/2015  . Depression   . Diverticulosis of colon without hemorrhage 12/18/2014  . ED (erectile dysfunction) 08/22/2013  . Elevated WBCs 04/10/2014  . GERD (gastroesophageal reflux disease)   . Hematuria 04/30/2011  . Hemorrhoids 04/29/2011  . Hyperlipidemia   . IBS (irritable bowel syndrome)   . Insomnia 05/28/2011  . Laceration of skin of scalp 09/05/2012   right  . Lipoma 04/21/2012   On back near left shoulder blade and above right hip  . Mumps as a child  . Rectal bleeding 04/29/2011  . Skin cancer    face, arms, legs, and ear skin  cancer Followed with surgeon who has retired per patient, has had 2 Mohs procedures on his right ear and 2 lesions excised, one form each leg.   . Sun-damaged skin 04/21/2012  . Tubular adenoma of colon   . Urinary frequency 04/30/2011  . Urinary hesitancy 04/30/2011    Past Surgical History:  Procedure Laterality Date  . MOHS SURGERY  2010   right ear, twice  . remove squamous cell     b/l legs    Family History  Problem Relation Age of Onset  . COPD Mother   . Emphysema Mother   . Stroke Mother        X 2  . Heart disease Mother        CABG in her 63s  . Hyperlipidemia Mother   . Hypertension Mother   . Diabetes Mother        type 2  . Cancer Father        melanoma  . Stroke Father        24  . Hypertension Father   . Alcohol abuse Father   . Heart disease Father   . Diabetes Maternal Grandmother        type 2  . Heart disease Maternal Grandmother        CHF  . Heart attack Maternal Grandfather   . Heart disease Paternal Grandmother   . Heart  disease Paternal Grandfather   . Colon cancer Neg Hx   . Colon polyps Neg Hx     Social History   Socioeconomic History  . Marital status: Married    Spouse name: Sharyn Lull  . Number of children: 2  . Years of education: Not on file  . Highest education level: Not on file  Social Needs  . Financial resource strain: Not on file  . Food insecurity - worry: Not on file  . Food insecurity - inability: Not on file  . Transportation needs - medical: Not on file  . Transportation needs - non-medical: Not on file  Occupational History  . Occupation: Landscape  Tobacco Use  . Smoking status: Never Smoker  . Smokeless tobacco: Never Used  Substance and Sexual Activity  . Alcohol use: Yes    Alcohol/week: 0.0 oz    Frequency: Never    Comment: twice monthly  . Drug use: No  . Sexual activity: Yes  Other Topics Concern  . Not on file  Social History Narrative  . Not on file    Outpatient Medications Prior to Visit    Medication Sig Dispense Refill  . aspirin 81 MG tablet Take 81 mg by mouth daily.    Marland Kitchen buPROPion (WELLBUTRIN XL) 300 MG 24 hr tablet Take 1 tablet (300 mg total) by mouth daily. 90 tablet 3  . cyclobenzaprine (FLEXERIL) 5 MG tablet Take 1 tablet (5 mg total) by mouth 3 (three) times daily as needed for muscle spasms. 21 tablet 0  . FLUoxetine (PROZAC) 20 MG capsule TAKE 1 CAPSULE BY MOUTH ONCE DAILY 90 capsule 0  . fluticasone (FLONASE) 50 MCG/ACT nasal spray Place 2 sprays into both nostrils every morning. 16 g 6  . hydrocortisone (ANUSOL-HC) 2.5 % rectal cream Place 1 application rectally 2 (two) times daily. 30 g 0  . mupirocin ointment (BACTROBAN) 2 % APPLY TO NARES EVERY NIGHT AT BEDTIME AS NEEDED FOR LESIONS 22 g 1  . ranitidine (ZANTAC) 300 MG tablet TAKE 1 TABLET BY MOUTH AT BEDTIME 90 tablet 0  . diclofenac (VOLTAREN) 75 MG EC tablet TAKE 1 TABLET BY MOUTH TWICE DAILY (Patient not taking: Reported on 06/03/2017) 30 tablet 2  . fenofibrate micronized (ANTARA) 130 MG capsule Take 1 capsule (130 mg total) by mouth daily. (Patient not taking: Reported on 06/03/2017) 30 capsule 3   No facility-administered medications prior to visit.     Allergies  Allergen Reactions  . Crestor [Rosuvastatin]     myalgias    Review of Systems  Constitutional: Negative for fever and malaise/fatigue.  HENT: Negative for congestion.   Eyes: Negative for blurred vision.  Respiratory: Negative for shortness of breath.   Cardiovascular: Negative for chest pain, palpitations and leg swelling.  Gastrointestinal: Positive for blood in stool. Negative for abdominal pain, constipation, diarrhea, melena, nausea and vomiting.  Genitourinary: Negative for dysuria and frequency.  Musculoskeletal: Negative for falls.  Skin: Negative for rash.  Neurological: Negative for dizziness, loss of consciousness and headaches.  Endo/Heme/Allergies: Negative for environmental allergies.  Psychiatric/Behavioral: Negative  for depression. The patient is nervous/anxious.        Objective:    Physical Exam  Constitutional: He is oriented to person, place, and time. He appears well-developed and well-nourished. No distress.  HENT:  Head: Normocephalic and atraumatic.  Nose: Nose normal.  Eyes: Right eye exhibits no discharge. Left eye exhibits no discharge.  Neck: Normal range of motion. Neck supple.  Cardiovascular: Normal rate and regular  rhythm.  No murmur heard. Pulmonary/Chest: Effort normal and breath sounds normal.  Abdominal: Soft. Bowel sounds are normal. There is no tenderness.  Musculoskeletal: He exhibits no edema.  Neurological: He is alert and oriented to person, place, and time.  Skin: Skin is warm and dry.  Psychiatric: He has a normal mood and affect.  Nursing note and vitals reviewed.   BP 124/84 (BP Location: Left Arm, Patient Position: Sitting, Cuff Size: Normal)   Pulse 76   Resp 18   Ht 5\' 11"  (1.803 m)   Wt 165 lb (74.8 kg)   SpO2 98%   BMI 23.01 kg/m  Wt Readings from Last 3 Encounters:  06/03/17 165 lb (74.8 kg)  06/03/17 165 lb 3.2 oz (74.9 kg)  09/12/16 165 lb 9.6 oz (75.1 kg)   BP Readings from Last 3 Encounters:  06/03/17 124/84  06/03/17 124/84  09/12/16 110/82     Immunization History  Administered Date(s) Administered  . Td 03/25/2005  . Tdap 05/29/2015    Health Maintenance  Topic Date Due  . HIV Screening  01/22/1980  . INFLUENZA VACCINE  02/03/2018 (Originally 10/23/2016)  . COLONOSCOPY  11/10/2019  . TETANUS/TDAP  05/28/2025    Lab Results  Component Value Date   WBC 9.8 06/03/2017   HGB 15.2 06/03/2017   HCT 43.9 06/03/2017   PLT 286.0 06/03/2017   GLUCOSE 90 06/03/2017   CHOL 252 (H) 06/03/2017   TRIG 136.0 06/03/2017   HDL 41.60 06/03/2017   LDLDIRECT 130.2 04/04/2014   LDLCALC 183 (H) 06/03/2017   ALT 22 06/03/2017   AST 16 06/03/2017   NA 136 06/03/2017   K 4.1 06/03/2017   CL 100 06/03/2017   CREATININE 0.87 06/03/2017   BUN  14 06/03/2017   CO2 31 06/03/2017   TSH 2.56 06/03/2017   PSA 0.48 04/13/2015    Lab Results  Component Value Date   TSH 2.56 06/03/2017   Lab Results  Component Value Date   WBC 9.8 06/03/2017   HGB 15.2 06/03/2017   HCT 43.9 06/03/2017   MCV 93.7 06/03/2017   PLT 286.0 06/03/2017   Lab Results  Component Value Date   NA 136 06/03/2017   K 4.1 06/03/2017   CO2 31 06/03/2017   GLUCOSE 90 06/03/2017   BUN 14 06/03/2017   CREATININE 0.87 06/03/2017   BILITOT 0.4 06/03/2017   ALKPHOS 72 06/03/2017   AST 16 06/03/2017   ALT 22 06/03/2017   PROT 7.3 06/03/2017   ALBUMIN 4.6 06/03/2017   CALCIUM 9.8 06/03/2017   GFR 97.80 06/03/2017   Lab Results  Component Value Date   CHOL 252 (H) 06/03/2017   Lab Results  Component Value Date   HDL 41.60 06/03/2017   Lab Results  Component Value Date   LDLCALC 183 (H) 06/03/2017   Lab Results  Component Value Date   TRIG 136.0 06/03/2017   Lab Results  Component Value Date   CHOLHDL 6 06/03/2017   No results found for: HGBA1C       Assessment & Plan:   Problem List Items Addressed This Visit    Anxiety and depression    Continues to struggle with the grief of loosing his mother recently. Feels he is handling it well and does not feel he needs counseling or medication adjustments at this time.       Hyperlipidemia    Encouraged heart healthy diet, increase exercise, avoid trans fats, consider a krill oil cap daily  Relevant Medications   ezetimibe (ZETIA) 10 MG tablet   Other Relevant Orders   Lipid panel (Completed)   TSH (Completed)   Myalgia    Increase hydration and check labs      Relevant Orders   Comprehensive metabolic panel (Completed)   Magnesium (Completed)   Blood in stool    He has a history of colon polyps and hemorrhoids. Unfortunately he did not go for his procedure to have his hemorrhoids removed. Now he is having blood in his stool several times a week. No correlation with  straining. No anorexia or abdominal pain. Referred back to gastroenterology for further consideration. Given anusol hc suppositories to use prn.       Relevant Orders   Ambulatory referral to Gastroenterology   Night sweats    New onset will check labs and encouraged to minimize simple carbs, increase exercise. Add protein to each meal. Keep stress down and hydrate well. If continues will need further work up      Relevant Orders   Ambulatory referral to Gastroenterology   TSH (Completed)   Urinalysis (Completed)   Change in bowel habits - Primary   Relevant Orders   Ambulatory referral to Gastroenterology   CBC with Differential/Platelet (Completed)   TSH (Completed)   Anemia   Relevant Orders   Ambulatory referral to Gastroenterology   CBC with Differential/Platelet (Completed)      I am having Grace Bushy. Turney "Richardson Landry" start on hydrocortisone and ezetimibe. I am also having him maintain his aspirin, mupirocin ointment, fluticasone, buPROPion, fenofibrate micronized, hydrocortisone, cyclobenzaprine, diclofenac, FLUoxetine, and ranitidine.  Meds ordered this encounter  Medications  . hydrocortisone (ANUSOL-HC) 25 MG suppository    Sig: Place 1 suppository (25 mg total) rectally at bedtime as needed for hemorrhoids or anal itching.    Dispense:  20 suppository    Refill:  1  . ezetimibe (ZETIA) 10 MG tablet    Sig: Take 1 tablet (10 mg total) by mouth daily.    Dispense:  30 tablet    Refill:  3    CMA served as scribe during this visit. History, Physical and Plan performed by medical provider. Documentation and orders reviewed and attested to.  Penni Homans, MD

## 2017-06-03 NOTE — Patient Instructions (Signed)
64 oz of clear fluids daily and avoid alcohol o Anemia Anemia is a condition in which you do not have enough red blood cells or hemoglobin. Hemoglobin is a substance in red blood cells that carries oxygen. When you do not have enough red blood cells or hemoglobin (are anemic), your body cannot get enough oxygen and your organs may not work properly. As a result, you may feel very tired or have other problems. What are the causes? Common causes of anemia include:  Excessive bleeding. Anemia can be caused by excessive bleeding inside or outside the body, including bleeding from the intestine or from periods in women.  Poor nutrition.  Long-lasting (chronic) kidney, thyroid, and liver disease.  Bone marrow disorders.  Cancer and treatments for cancer.  HIV (human immunodeficiency virus) and AIDS (acquired immunodeficiency syndrome).  Treatments for HIV and AIDS.  Spleen problems.  Blood disorders.  Infections, medicines, and autoimmune disorders that destroy red blood cells.  What are the signs or symptoms? Symptoms of this condition include:  Minor weakness.  Dizziness.  Headache.  Feeling heartbeats that are irregular or faster than normal (palpitations).  Shortness of breath, especially with exercise.  Paleness.  Cold sensitivity.  Indigestion.  Nausea.  Difficulty sleeping.  Difficulty concentrating.  Symptoms may occur suddenly or develop slowly. If your anemia is mild, you may not have symptoms. How is this diagnosed? This condition is diagnosed based on:  Blood tests.  Your medical history.  A physical exam.  Bone marrow biopsy.  Your health care provider may also check your stool (feces) for blood and may do additional testing to look for the cause of your bleeding. You may also have other tests, including:  Imaging tests, such as a CT scan or MRI.  Endoscopy.  Colonoscopy.  How is this treated? Treatment for this condition depends on the  cause. If you continue to lose a lot of blood, you may need to be treated at a hospital. Treatment may include:  Taking supplements of iron, vitamin K59, or folic acid.  Taking a hormone medicine (erythropoietin) that can help to stimulate red blood cell growth.  Having a blood transfusion. This may be needed if you lose a lot of blood.  Making changes to your diet.  Having surgery to remove your spleen.  Follow these instructions at home:  Take over-the-counter and prescription medicines only as told by your health care provider.  Take supplements only as told by your health care provider.  Follow any diet instructions that you were given.  Keep all follow-up visits as told by your health care provider. This is important. Contact a health care provider if:  You develop new bleeding anywhere in the body. Get help right away if:  You are very weak.  You are short of breath.  You have pain in your abdomen or chest.  You are dizzy or feel faint.  You have trouble concentrating.  You have bloody or black, tarry stools.  You vomit repeatedly or you vomit up blood. Summary  Anemia is a condition in which you do not have enough red blood cells or enough of a substance in your red blood cells that carries oxygen (hemoglobin).  Symptoms may occur suddenly or develop slowly.  If your anemia is mild, you may not have symptoms.  This condition is diagnosed with blood tests as well as a medical history and physical exam. Other tests may be needed.  Treatment for this condition depends on the cause of  the anemia. This information is not intended to replace advice given to you by your health care provider. Make sure you discuss any questions you have with your health care provider. Document Released: 04/18/2004 Document Revised: 04/12/2016 Document Reviewed: 04/12/2016 Elsevier Interactive Patient Education  [employer].

## 2017-06-03 NOTE — Assessment & Plan Note (Signed)
Encouraged heart healthy diet, increase exercise, avoid trans fats, consider a krill oil cap daily 

## 2017-06-04 DIAGNOSIS — D649 Anemia, unspecified: Secondary | ICD-10-CM | POA: Insufficient documentation

## 2017-06-04 DIAGNOSIS — R61 Generalized hyperhidrosis: Secondary | ICD-10-CM | POA: Insufficient documentation

## 2017-06-04 DIAGNOSIS — Z0000 Encounter for general adult medical examination without abnormal findings: Secondary | ICD-10-CM | POA: Insufficient documentation

## 2017-06-04 DIAGNOSIS — R194 Change in bowel habit: Secondary | ICD-10-CM | POA: Insufficient documentation

## 2017-06-04 DIAGNOSIS — K921 Melena: Secondary | ICD-10-CM | POA: Insufficient documentation

## 2017-06-04 MED ORDER — EZETIMIBE 10 MG PO TABS: 10.0000 mg | ORAL_TABLET | Freq: Every day | ORAL | 3 refills | Status: DC

## 2017-06-04 NOTE — Assessment & Plan Note (Signed)
Continues to struggle with the grief of loosing his mother recently. Feels he is handling it well and does not feel he needs counseling or medication adjustments at this time.

## 2017-06-04 NOTE — Assessment & Plan Note (Signed)
He has a history of colon polyps and hemorrhoids. Unfortunately he did not go for his procedure to have his hemorrhoids removed. Now he is having blood in his stool several times a week. No correlation with straining. No anorexia or abdominal pain. Referred back to gastroenterology for further consideration. Given anusol hc suppositories to use prn.

## 2017-06-04 NOTE — Assessment & Plan Note (Signed)
New onset will check labs and encouraged to minimize simple carbs, increase exercise. Add protein to each meal. Keep stress down and hydrate well. If continues will need further work up

## 2017-06-04 NOTE — Assessment & Plan Note (Signed)
Patient encouraged to maintain heart healthy diet, regular exercise, adequate sleep. Consider daily probiotics. Take medications as prescribed 

## 2017-06-19 ENCOUNTER — Ambulatory Visit (INDEPENDENT_AMBULATORY_CARE_PROVIDER_SITE_OTHER): Payer: BLUE CROSS/BLUE SHIELD | Admitting: Physician Assistant

## 2017-06-19 ENCOUNTER — Encounter: Payer: Self-pay | Admitting: Physician Assistant

## 2017-06-19 VITALS — BP 128/84 | HR 76 | Ht 69.0 in | Wt 165.5 lb

## 2017-06-19 DIAGNOSIS — K625 Hemorrhage of anus and rectum: Secondary | ICD-10-CM | POA: Diagnosis not present

## 2017-06-19 DIAGNOSIS — Z86010 Personal history of colonic polyps: Secondary | ICD-10-CM | POA: Diagnosis not present

## 2017-06-19 DIAGNOSIS — K648 Other hemorrhoids: Secondary | ICD-10-CM | POA: Diagnosis not present

## 2017-06-19 MED ORDER — HYDROCORTISONE ACETATE 25 MG RE SUPP: RECTAL | 2 refills | Status: DC

## 2017-06-19 NOTE — Patient Instructions (Addendum)
If you are age 53 or older, your body mass index should be between 23-30. Your Body mass index is 24.44 kg/m. If this is out of the aforementioned range listed, please consider follow up with your Primary Care Provider.  If you are age 31 or younger, your body mass index should be between 19-25. Your Body mass index is 24.44 kg/m. If this is out of the aformentioned range listed, please consider follow up with your Primary Care Provider.   We have sent the following medications to your pharmacy for you to pick up at your convenience: Anusol Suppository use at bedtime x 1 week and as needed for bleeding.

## 2017-06-19 NOTE — Progress Notes (Addendum)
Subjective:    Patient ID: Aaron Dyer, male    DOB: 1965-11-11, 52 y.o.   MRN: [id number]  HPI Aaron Dyer" is a 52 year old white male, known to Dr. Hilarie Fredrickson who was last seen in our office in 2017.  Comes back today after recently seeing Dr. Charlett Blake and mentioning that he was continuing to see blood in his stools intermittently and had recently been seeing more blood than in the past. Last colonoscopy was done in August 2016 for history of adenomatous colon polyps.  He was found to have 2 3-5 mm polyps both of which were removed and found to be benign polypoid mucosa.  Also with mild diverticulosis in the sigmoid and internal hemorrhoids.  Is indicated for 5-year interval follow-up. After that colonoscopy he was scheduled for hemorrhoidal banding with Dr. Hilarie Fredrickson in August 2017.  He came for that appointment but then left prior to being seen. Patient currently denies any problems with abdominal pain.  He says he occasionally has mild constipation and sometimes will have loose stools.  He is not requiring any laxatives and does not feel that he needs anything to regulate his bowels.  He has had ongoing intermittent bleeding over the past couple of years and says over the past several months there will be more blood at times than what he had seen in past years.  He has no complaints of rectal discomfort.  He does have a prescription for Anusol suppositories but generally does not use them. CBC was done on 06/03/2017 hemoglobin 15.2 hematocrit of 43.9  Review of Systems Pertinent positive and negative review of systems were noted in the above HPI section.  All other review of systems was otherwise negative.  Outpatient Encounter Medications as of 06/19/2017  Medication Sig  . buPROPion (WELLBUTRIN XL) 300 MG 24 hr tablet Take 1 tablet (300 mg total) by mouth daily.  . cyclobenzaprine (FLEXERIL) 5 MG tablet Take 1 tablet (5 mg total) by mouth 3 (three) times daily as needed for muscle spasms.  .  diclofenac (VOLTAREN) 75 MG EC tablet TAKE 1 TABLET BY MOUTH TWICE DAILY  . ezetimibe (ZETIA) 10 MG tablet Take 1 tablet (10 mg total) by mouth daily.  . fenofibrate micronized (ANTARA) 130 MG capsule Take 1 capsule (130 mg total) by mouth daily.  Marland Kitchen FLUoxetine (PROZAC) 20 MG capsule TAKE 1 CAPSULE BY MOUTH ONCE DAILY  . fluticasone (FLONASE) 50 MCG/ACT nasal spray Place 2 sprays into both nostrils every morning.  . hydrocortisone (ANUSOL-HC) 2.5 % rectal cream Place 1 application rectally 2 (two) times daily.  . hydrocortisone (ANUSOL-HC) 25 MG suppository Use at bedtime x 1 week and as needed for bleeding.  . mupirocin ointment (BACTROBAN) 2 % APPLY TO NARES EVERY NIGHT AT BEDTIME AS NEEDED FOR LESIONS  . ranitidine (ZANTAC) 300 MG tablet TAKE 1 TABLET BY MOUTH AT BEDTIME  . [DISCONTINUED] hydrocortisone (ANUSOL-HC) 25 MG suppository Place 1 suppository (25 mg total) rectally at bedtime as needed for hemorrhoids or anal itching.  . [DISCONTINUED] aspirin 81 MG tablet Take 81 mg by mouth daily.   No facility-administered encounter medications on file as of 06/19/2017.    Allergies  Allergen Reactions  . Crestor [Rosuvastatin]     myalgias   Patient Active Problem List   Diagnosis Date Noted  . Blood in stool 06/04/2017  . Night sweats 06/04/2017  . Change in bowel habits 06/04/2017  . Anemia 06/04/2017  . Preventative health care 06/04/2017  . Myalgia 06/03/2017  .  Constipation 09/08/2015  . Tick bite 09/08/2015  . Cold intolerance 06/04/2015  . Diverticulosis of colon without hemorrhage 12/18/2014  . Acute lymphadenitis of lower extremity 10/29/2014  . Elevated WBCs 04/10/2014  . Neck pain, bilateral 11/29/2013  . ED (erectile dysfunction) 08/22/2013  . Back pain 03/15/2013  . Laceration of skin of scalp 09/05/2012  . Lipoma 04/21/2012  . Sun-damaged skin 04/21/2012  . Chest pain 11/19/2011  . Colon polyps 05/28/2011  . Insomnia 05/28/2011  . Urinary frequency 04/30/2011  .  Hematuria 04/30/2011  . Reflux 04/29/2011  . Hemorrhoids 04/29/2011  . Anxiety and depression   . Skin cancer   . Hyperlipidemia    Social History   Socioeconomic History  . Marital status: Married    Spouse name: Sharyn Lull  . Number of children: 2  . Years of education: Not on file  . Highest education level: Not on file  Occupational History  . Occupation: Designer, multimedia  . Financial resource strain: Not on file  . Food insecurity:    Worry: Not on file    Inability: Not on file  . Transportation needs:    Medical: Not on file    Non-medical: Not on file  Tobacco Use  . Smoking status: Never Smoker  . Smokeless tobacco: Never Used  Substance and Sexual Activity  . Alcohol use: Yes    Alcohol/week: 0.0 oz    Frequency: Never    Comment: twice monthly  . Drug use: No  . Sexual activity: Yes  Lifestyle  . Physical activity:    Days per week: Not on file    Minutes per session: Not on file  . Stress: Not on file  Relationships  . Social connections:    Talks on phone: Not on file    Gets together: Not on file    Attends religious service: Not on file    Active member of club or organization: Not on file    Attends meetings of clubs or organizations: Not on file    Relationship status: Not on file  . Intimate partner violence:    Fear of current or ex partner: Not on file    Emotionally abused: Not on file    Physically abused: Not on file    Forced sexual activity: Not on file  Other Topics Concern  . Not on file  Social History Narrative  . Not on file    Mr. Galeana's family history includes Alcohol abuse in his father; COPD in his mother; Cancer in his father; Diabetes in his maternal grandmother and mother; Emphysema in his mother; Heart attack in his maternal grandfather; Heart disease in his father, maternal grandmother, mother, paternal grandfather, and paternal grandmother; Hyperlipidemia in his mother; Hypertension in his father and mother;  Stroke in his father and mother.      Objective:    Vitals:   06/19/17 1406  BP: 128/84  Pulse: 76    Physical Exam; well-developed white male in no acute distress, accompanied by his wife, both pleasant blood pressure 128/84 pulse 76, height 5 foot 9, weight 165, BMI 24.4.  HEENT; nontraumatic normocephalic EOMI PERRLA sclera anicteric, Cardiovascular; regular rate and rhythm with S1-S2 no murmur rub or gallop, Pulmonary; clear bilaterally, Abdomen ;soft, nontender nondistended bowel sounds are active there is no palpable mass or hepatosplenomegaly, Rectal ;exam not done, Extremities ;no clubbing cyanosis or edema skin warm and dry, Neuro psych; mood and affect appropriate       Assessment &  Plan:   #61 52 year old white male with ongoing intermittent rectal bleeding over the past 3 years, some recent increase in the amount of blood over the past months.  Patient has documented internal hemorrhoids from prior colonoscopy which is very likely the source of his ongoing bleeding. 2.  History of adenomatous colon polyps-up-to-date with colonoscopy last done August 2016, due for follow-up 2021 #3 history of IBS 4.  Diverticulosis 5.  History of melanoma 6.  History of depression anxiety  Plan; Discussed hemorrhoidal banding with the patient and his wife today.  He is interested in pursuing this.  He will be scheduled for banding appointment with Dr. Hilarie Fredrickson.  He was advised that he may need 2 or 3 sessions to treat his hemorrhoids. He was also advised to use Anusol HC suppositories on an as needed basis when he has an episode of bleeding . Plan for follow-up colonoscopy 2021  Alfredia Ferguson PA-C 06/19/2017   Cc: Mosie Lukes, MD   Addendum: Reviewed and agree with initial management. Pyrtle, Lajuan Lines, MD

## 2017-07-14 ENCOUNTER — Encounter: Payer: Self-pay | Admitting: *Deleted

## 2017-07-17 ENCOUNTER — Other Ambulatory Visit: Payer: Self-pay | Admitting: Family Medicine

## 2017-07-17 DIAGNOSIS — F32A Depression, unspecified: Secondary | ICD-10-CM

## 2017-07-17 DIAGNOSIS — F418 Other specified anxiety disorders: Secondary | ICD-10-CM

## 2017-07-17 DIAGNOSIS — M545 Low back pain, unspecified: Secondary | ICD-10-CM

## 2017-07-17 DIAGNOSIS — F329 Major depressive disorder, single episode, unspecified: Secondary | ICD-10-CM

## 2017-07-17 DIAGNOSIS — E785 Hyperlipidemia, unspecified: Secondary | ICD-10-CM

## 2017-07-17 DIAGNOSIS — K649 Unspecified hemorrhoids: Secondary | ICD-10-CM

## 2017-07-17 DIAGNOSIS — W57XXXA Bitten or stung by nonvenomous insect and other nonvenomous arthropods, initial encounter: Secondary | ICD-10-CM

## 2017-07-17 DIAGNOSIS — D72829 Elevated white blood cell count, unspecified: Secondary | ICD-10-CM

## 2017-07-17 DIAGNOSIS — S0101XA Laceration without foreign body of scalp, initial encounter: Secondary | ICD-10-CM

## 2017-07-31 ENCOUNTER — Encounter: Payer: Self-pay | Admitting: Internal Medicine

## 2017-07-31 ENCOUNTER — Ambulatory Visit (INDEPENDENT_AMBULATORY_CARE_PROVIDER_SITE_OTHER): Payer: BLUE CROSS/BLUE SHIELD | Admitting: Internal Medicine

## 2017-07-31 VITALS — BP 116/74 | HR 76 | Ht 69.0 in | Wt 160.2 lb

## 2017-07-31 DIAGNOSIS — K648 Other hemorrhoids: Secondary | ICD-10-CM

## 2017-07-31 NOTE — Progress Notes (Signed)
Aaron Dyer is a 52 year old male with a history of adenomatous polyps and internal hemorrhoids who returns for hemorrhoidal banding.  He was seen by Nicoletta Ba on 06/19/2017 with classic hemorrhoidal symptoms.  He reports that he has bright red blood per rectum with about half of his bowel movements.  This is usually painless in nature.  He has some mild anal itching but no prolapse or fecal seepage/smearing.  Bowel movements can be inconsistent with both loose stool and at times constipation.  He has a bowel movement every day to every 2 days.  At times he does feel that it is difficult to produce a stool and thus he strains.  Usually when stool is hard.   PROCEDURE NOTE:  The patient presents with symptomatic grade 2 internal hemorrhoids, requesting rubber band ligation of his hemorrhoidal disease.  All risks, benefits and alternative forms of therapy were described and informed consent was obtained.   The anorectum was pre-medicated with 0.125% nitroglycerin ointment The decision was made to band the LL internal hemorrhoid, and the [place] was used to perform band ligation without complication.   Digital anorectal examination was then performed to assure proper positioning of the band, and to adjust the banded tissue as required.  The patient was discharged home without pain or other issues.  Dietary and behavioral recommendations were given and along with follow-up instructions.     The following adjunctive treatments were recommended: Begin Metamucil 1 tablespoon once to twice daily for altered bowel habits with constipation exacerbating hemorrhoidal bleeding  The patient will return as scheduled for follow-up and possible additional banding as required. No complications were encountered and the patient tolerated the procedure well.

## 2017-07-31 NOTE — Patient Instructions (Addendum)
If you are age 53 or older, your body mass index should be between 23-30. Your Body mass index is 23.66 kg/m. If this is out of the aforementioned range listed, please consider follow up with your Primary Care Provider.  If you are age 12 or younger, your body mass index should be between 19-25. Your Body mass index is 23.66 kg/m. If this is out of the aformentioned range listed, please consider follow up with your Primary Care Provider.   HEMORRHOID BANDING PROCEDURE    FOLLOW-UP CARE   1. The procedure you have had should have been relatively painless since the banding of the area involved does not have nerve endings and there is no pain sensation.  The rubber band cuts off the blood supply to the hemorrhoid and the band may fall off as soon as 48 hours after the banding (the band may occasionally be seen in the toilet bowl following a bowel movement). You may notice a temporary feeling of fullness in the rectum which should respond adequately to plain Tylenol or Motrin.  2. Following the banding, avoid strenuous exercise that evening and resume full activity the next day.  A sitz bath (soaking in a warm tub) or bidet is soothing, and can be useful for cleansing the area after bowel movements.     3. To avoid constipation, take two tablespoons of natural wheat bran, natural oat bran, flax, Benefiber or any over the counter fiber supplement and increase your water intake to 7-8 glasses daily.    4. Unless you have been prescribed anorectal medication, do not put anything inside your rectum for two weeks: No suppositories, enemas, fingers, etc.  5. Occasionally, you may have more bleeding than usual after the banding procedure.  This is often from the untreated hemorrhoids rather than the treated one.  Don't be concerned if there is a tablespoon or so of blood.  If there is more blood than this, lie flat with your bottom higher than your head and apply an ice pack to the area. If the bleeding  does not stop within a half an hour or if you feel faint, call our office at [phone] or go to the emergency room.  6. Problems are not common; however, if there is a substantial amount of bleeding, severe pain, chills, fever or difficulty passing urine (very rare) or other problems, you should call us at [phone] or report to the nearest emergency room.  7. Do not stay seated continuously for more than 2-3 hours for a day or two after the procedure.  Tighten your buttock muscles 10-15 times every two hours and take 10-15 deep breaths every 1-2 hours.  Do not spend more than a few minutes on the toilet if you cannot empty your bowel; instead re-visit the toilet at a later time.    Start Metamucil 1 heaping tablespoon 1-2 times daily.  Follow up with me on August 27, 2017 at 11:30 am.  May need repeat banding.  Thank you for choosing me and [facility].   Dr. Billie Lade

## 2017-08-05 ENCOUNTER — Ambulatory Visit: Payer: Medicare Other | Admitting: Family Medicine

## 2017-08-06 ENCOUNTER — Encounter: Payer: Self-pay | Admitting: Family Medicine

## 2017-08-27 ENCOUNTER — Telehealth: Payer: Self-pay | Admitting: Internal Medicine

## 2017-08-27 ENCOUNTER — Encounter: Payer: Self-pay | Admitting: Internal Medicine

## 2017-08-27 ENCOUNTER — Ambulatory Visit (INDEPENDENT_AMBULATORY_CARE_PROVIDER_SITE_OTHER): Payer: BLUE CROSS/BLUE SHIELD | Admitting: Internal Medicine

## 2017-08-27 VITALS — BP 126/80 | HR 72 | Ht 69.0 in | Wt 159.2 lb

## 2017-08-27 DIAGNOSIS — K648 Other hemorrhoids: Secondary | ICD-10-CM

## 2017-08-27 NOTE — Progress Notes (Signed)
Aaron Dyer is a 52 year old male with a history of adenomatous polyps and internal hemorrhoids who returns today for second hemorrhoidal banding  History of hemorrhoidal bleeding, mild perianal itching.  Some inconsistent bowel movements.  Did not yet start Metamucil  Hemorrhoidal banding #1 performed on 07/31/2017; tolerated well.  Had 1 to 2 days of somewhat heavy rectal bleeding about 2 days after banding.  This resolved.  Now seeing some scant blood with wiping and bowel movement.   PROCEDURE NOTE:  The patient presents with symptomatic grade 2 internal hemorrhoids, requesting rubber band ligation of his hemorrhoidal disease.  All risks, benefits and alternative forms of therapy were described and informed consent was obtained.   The anorectum was pre-medicated with 0.125% nitroglycerin ointment The decision was made to band the RA (LL banded at visit #1) internal hemorrhoid, and the [place] was used to perform band ligation without complication.   Digital anorectal examination was then performed to assure proper positioning of the band, and to adjust the banded tissue as required.  The patient was discharged home without pain or other issues.  Dietary and behavioral recommendations were given and along with follow-up instructions.     The following adjunctive treatments were recommended: Start Metamucil 1 tablespoon once to twice daily for intermittent constipation to hopefully make bowel movements more consistent and easier to pass.  Should improve exacerbation of hemorrhoidal disease.  The patient will return as scheduled for follow-up and possible additional banding as required. No complications were encountered and the patient tolerated the procedure well.

## 2017-08-27 NOTE — Patient Instructions (Addendum)
You have been scheduled for your 3rd banding on 10/20/17 at 330 pm.  Please purchase the following medications over the counter and take as directed: Metamucil 1 tablespoon 1-2 times daily  HEMORRHOID BANDING PROCEDURE    FOLLOW-UP CARE   1. The procedure you have had should have been relatively painless since the banding of the area involved does not have nerve endings and there is no pain sensation.  The rubber band cuts off the blood supply to the hemorrhoid and the band may fall off as soon as 48 hours after the banding (the band may occasionally be seen in the toilet bowl following a bowel movement). You may notice a temporary feeling of fullness in the rectum which should respond adequately to plain Tylenol or Motrin.  2. Following the banding, avoid strenuous exercise that evening and resume full activity the next day.  A sitz bath (soaking in a warm tub) or bidet is soothing, and can be useful for cleansing the area after bowel movements.     3. To avoid constipation, take two tablespoons of natural wheat bran, natural oat bran, flax, Benefiber or any over the counter fiber supplement and increase your water intake to 7-8 glasses daily.    4. Unless you have been prescribed anorectal medication, do not put anything inside your rectum for two weeks: No suppositories, enemas, fingers, etc.  5. Occasionally, you may have more bleeding than usual after the banding procedure.  This is often from the untreated hemorrhoids rather than the treated one.  Don't be concerned if there is a tablespoon or so of blood.  If there is more blood than this, lie flat with your bottom higher than your head and apply an ice pack to the area. If the bleeding does not stop within a half an hour or if you feel faint, call our office at [phone] or go to the emergency room.  6. Problems are not common; however, if there is a substantial amount of bleeding, severe pain, chills, fever or difficulty passing  urine (very rare) or other problems, you should call us at [phone] or report to the nearest emergency room.  7. Do not stay seated continuously for more than 2-3 hours for a day or two after the procedure.  Tighten your buttock muscles 10-15 times every two hours and take 10-15 deep breaths every 1-2 hours.  Do not spend more than a few minutes on the toilet if you cannot empty your bowel; instead re-visit the toilet at a later time.    If you are age 46 or older, your body mass index should be between 23-30. Your Body mass index is 23.51 kg/m. If this is out of the aforementioned range listed, please consider follow up with your Primary Care Provider.  If you are age 23 or younger, your body mass index should be between 19-25. Your Body mass index is 23.51 kg/m. If this is out of the aformentioned range listed, please consider follow up with your Primary Care Provider.

## 2017-08-27 NOTE — Telephone Encounter (Signed)
See note below and advise. 

## 2017-08-27 NOTE — Telephone Encounter (Signed)
So he saw the band in his stool? Or it was passed via rectum? This is not expected and likely will not result in intended result. Thus, he should be rescheduled for repeat banding and I will not charge him for the next visit JMP

## 2017-08-27 NOTE — Telephone Encounter (Signed)
Pt states the band was passed with some slimy clear material. Pt scheduled for banding per Dr. Hilarie Fredrickson 09/04/17@8 :45am. Pt aware of appt.

## 2017-09-04 ENCOUNTER — Encounter: Payer: Self-pay | Admitting: Internal Medicine

## 2017-09-04 ENCOUNTER — Ambulatory Visit (INDEPENDENT_AMBULATORY_CARE_PROVIDER_SITE_OTHER): Payer: BLUE CROSS/BLUE SHIELD | Admitting: Internal Medicine

## 2017-09-04 VITALS — BP 142/84 | HR 70 | Ht 69.0 in | Wt 161.4 lb

## 2017-09-04 DIAGNOSIS — K648 Other hemorrhoids: Secondary | ICD-10-CM

## 2017-09-04 NOTE — Progress Notes (Signed)
Aaron Dyer is a 52 year old male with history of adenomatous polyps and internal hemorrhoids who returns for hemorrhoidal banding  I performed his second hemorrhoidal banding to the right anterior internal hemorrhoid on 08/27/2017.  Later that day after leaving he spontaneously passed the rubber band. For that reason I bring him back for repeat banding at no charge   PROCEDURE NOTE:  The patient presents with symptomatic grade 2 internal hemorrhoids, requesting rubber band ligation of his hemorrhoidal disease.  All risks, benefits and alternative forms of therapy were described and informed consent was obtained.   The anorectum was pre-medicated with 0.125% nitroglycerin ointment The decision was made to band the RA (LL x 1, RA 1 week ago but band fell off early) internal hemorrhoid, and the [place] was used to perform band ligation without complication.   Digital anorectal examination was then performed to assure proper positioning of the band, and to adjust the banded tissue as required.  The patient was discharged home without pain or other issues.  Dietary and behavioral recommendations were given and along with follow-up instructions.     The following adjunctive treatments were recommended: Continue with plan for Metamucil 1 tablespoon once to twice daily  The patient will return as scheduled for  follow-up and possible additional banding as required. No complications were encountered and the patient tolerated the procedure well.

## 2017-09-04 NOTE — Patient Instructions (Signed)
Your 3rd banding is scheduled for 10/20/17 at 330 pm.  HEMORRHOID BANDING PROCEDURE    FOLLOW-UP CARE   1. The procedure you have had should have been relatively painless since the banding of the area involved does not have nerve endings and there is no pain sensation.  The rubber band cuts off the blood supply to the hemorrhoid and the band may fall off as soon as 48 hours after the banding (the band may occasionally be seen in the toilet bowl following a bowel movement). You may notice a temporary feeling of fullness in the rectum which should respond adequately to plain Tylenol or Motrin.  2. Following the banding, avoid strenuous exercise that evening and resume full activity the next day.  A sitz bath (soaking in a warm tub) or bidet is soothing, and can be useful for cleansing the area after bowel movements.     3. To avoid constipation, take two tablespoons of natural wheat bran, natural oat bran, flax, Benefiber or any over the counter fiber supplement and increase your water intake to 7-8 glasses daily.    4. Unless you have been prescribed anorectal medication, do not put anything inside your rectum for two weeks: No suppositories, enemas, fingers, etc.  5. Occasionally, you may have more bleeding than usual after the banding procedure.  This is often from the untreated hemorrhoids rather than the treated one.  Don't be concerned if there is a tablespoon or so of blood.  If there is more blood than this, lie flat with your bottom higher than your head and apply an ice pack to the area. If the bleeding does not stop within a half an hour or if you feel faint, call our office at [phone] or go to the emergency room.  6. Problems are not common; however, if there is a substantial amount of bleeding, severe pain, chills, fever or difficulty passing urine (very rare) or other problems, you should call us at [phone] or report to the nearest emergency room.  7. Do not stay  seated continuously for more than 2-3 hours for a day or two after the procedure.  Tighten your buttock muscles 10-15 times every two hours and take 10-15 deep breaths every 1-2 hours.  Do not spend more than a few minutes on the toilet if you cannot empty your bowel; instead re-visit the toilet at a later time.

## 2017-09-15 ENCOUNTER — Telehealth: Payer: Self-pay | Admitting: Internal Medicine

## 2017-09-15 DIAGNOSIS — K648 Other hemorrhoids: Secondary | ICD-10-CM

## 2017-09-15 NOTE — Telephone Encounter (Signed)
Left message on machine to call back  

## 2017-09-15 NOTE — Telephone Encounter (Signed)
Please let pt know I am sorry to hear the bleeding has recurred so quickly after banding x 2  For persistent symptoms, surgery may be the best option for definitive treatment given lack of response to banding. I would have him come for CBC to ensure no anemia Please refer to see Dr. Johney Maine at [facility] if patient willing for hemorrhoidectomy after non-response to banding

## 2017-09-15 NOTE — Telephone Encounter (Signed)
The pt has a hx of hemorrhoids, had #2 banding a week ago but the banding came off by the time he got home.  Bleeding began 1 week after the second banding just as bad as prior to the initial banding.  The blood is bright red and has itching around the rectum.  Toilet and tissue turn bright with every BM.  Has 3-4 BM daily.  He states the stools are loose.  No abd pain, or fever.  Please advise

## 2017-09-15 NOTE — Telephone Encounter (Signed)
Patient returning phone call 

## 2017-09-16 NOTE — Telephone Encounter (Signed)
Left message on machine to call back  

## 2017-09-17 NOTE — Telephone Encounter (Signed)
The pt has been advised of the recommendations per Dr Hilarie Fredrickson.  [facility] referral  Made and lab in Epic.  Pt will come in at his convenience for the lab and will call if no appt made with [facility] in 1 week.

## 2017-09-29 ENCOUNTER — Other Ambulatory Visit (INDEPENDENT_AMBULATORY_CARE_PROVIDER_SITE_OTHER): Payer: BLUE CROSS/BLUE SHIELD

## 2017-09-29 DIAGNOSIS — K648 Other hemorrhoids: Secondary | ICD-10-CM | POA: Diagnosis not present

## 2017-09-29 LAB — CBC WITH DIFFERENTIAL/PLATELET
Basophils Absolute: 0 10*3/uL (ref 0.0–0.1)
Basophils Relative: 0.6 % (ref 0.0–3.0)
Eosinophils Absolute: 0.1 10*3/uL (ref 0.0–0.7)
Eosinophils Relative: 1.4 % (ref 0.0–5.0)
HEMATOCRIT: 44.6 % (ref 39.0–52.0)
HEMOGLOBIN: 15.3 g/dL (ref 13.0–17.0)
LYMPHS PCT: 31.8 % (ref 12.0–46.0)
Lymphs Abs: 2.7 10*3/uL (ref 0.7–4.0)
MCHC: 34.4 g/dL (ref 30.0–36.0)
MCV: 92.6 fl (ref 78.0–100.0)
MONOS PCT: 4.9 % (ref 3.0–12.0)
Monocytes Absolute: 0.4 10*3/uL (ref 0.1–1.0)
Neutro Abs: 5.2 10*3/uL (ref 1.4–7.7)
Neutrophils Relative %: 61.3 % (ref 43.0–77.0)
Platelets: 269 10*3/uL (ref 150.0–400.0)
RBC: 4.82 Mil/uL (ref 4.22–5.81)
RDW: 13.8 % (ref 11.5–15.5)
WBC: 8.5 10*3/uL (ref 4.0–10.5)

## 2017-10-01 ENCOUNTER — Other Ambulatory Visit: Payer: Self-pay | Admitting: Family Medicine

## 2017-10-01 DIAGNOSIS — D72829 Elevated white blood cell count, unspecified: Secondary | ICD-10-CM

## 2017-10-01 DIAGNOSIS — F32A Depression, unspecified: Secondary | ICD-10-CM

## 2017-10-01 DIAGNOSIS — S0101XA Laceration without foreign body of scalp, initial encounter: Secondary | ICD-10-CM

## 2017-10-01 DIAGNOSIS — M545 Low back pain, unspecified: Secondary | ICD-10-CM

## 2017-10-01 DIAGNOSIS — E785 Hyperlipidemia, unspecified: Secondary | ICD-10-CM

## 2017-10-01 DIAGNOSIS — F418 Other specified anxiety disorders: Secondary | ICD-10-CM

## 2017-10-01 DIAGNOSIS — K649 Unspecified hemorrhoids: Secondary | ICD-10-CM

## 2017-10-01 DIAGNOSIS — W57XXXA Bitten or stung by nonvenomous insect and other nonvenomous arthropods, initial encounter: Secondary | ICD-10-CM

## 2017-10-01 DIAGNOSIS — F329 Major depressive disorder, single episode, unspecified: Secondary | ICD-10-CM

## 2017-10-06 ENCOUNTER — Ambulatory Visit: Payer: Self-pay | Admitting: Surgery

## 2017-10-06 DIAGNOSIS — K648 Other hemorrhoids: Secondary | ICD-10-CM | POA: Diagnosis not present

## 2017-10-06 DIAGNOSIS — Z01818 Encounter for other preprocedural examination: Secondary | ICD-10-CM | POA: Diagnosis not present

## 2017-10-06 DIAGNOSIS — K643 Fourth degree hemorrhoids: Secondary | ICD-10-CM | POA: Diagnosis not present

## 2017-10-06 NOTE — H&P (View-Only) (Signed)
Aaron Dyer Documented: 10/06/2017 8:44 AM Location: [facility] Patient #: [id number] DOB: 17-Mar-1966 Married / Language: Cleophus Molt / Race: White Male  History of Present Illness Adin Hector MD; 10/06/2017 9:12 AM) The patient is a 52 year old male who presents with hemorrhoids. Note for "Hemorrhoids": ` ` ` Patient sent for surgical consultation at the request of Dr Luis Abed gastroenterology  Chief Complaint: Internal hemorrhoids with persistent bleeding despite numerous band attempts. ` ` The patient is a male with history of rectal bleeding. Had colonoscopy in 2016 with a few small polyps removed. Looks like he had enlarged internal hemorrhoids at least grade 2 on the pictures. Had persistent/worsening rectal bleeding presumed to be hemorrhoids. Underwent banding. Banding fell off. Underwent repeat banding. Still struggling. Surgical consultation requested. He's had some irritable bowel like symptoms with constipation. Followed by gastroenterology in the past. He is on a fiber supplement. Instead moving bowels twice a week he goes intermittently daily. Often has a lot of straining discomfort. He does not smoke. Not diabetic. No prior abdominal surgeries. No other interventions aside from hemorrhoiid banding.  No personal nor family history of GI/colon cancer, inflammatory bowel disease, irritable bowel syndrome, allergy such as Celiac Sprue, dietary/dairy problems, colitis, ulcers nor gastritis. No recent sick contacts/gastroenteritis. No travel outside the country. No changes in diet. No dysphagia to solids or liquids. No significant heartburn or reflux. No hematochezia, hematemesis, coffee ground emesis. No evidence of prior gastric/peptic ulceration.  (Review of systems as stated in this history (HPI) or in the review of systems. Otherwise all other 12 point ROS are negative) ` ` `   Past Surgical History Illene Regulus, CMA;  10/06/2017 8:45 AM) No pertinent past surgical history  Diagnostic Studies History Illene Regulus, CMA; 10/06/2017 8:45 AM) Colonoscopy 1-5 years ago  Allergies Lars Mage Spillers, CMA; 10/06/2017 8:46 AM) Crestor *ANTIHYPERLIPIDEMICS*  Medication History (Alisha Spillers, CMA; 10/06/2017 8:48 AM) RaNITidine HCl (300MG  Tablet, Oral) Active. FLUoxetine HCl (20MG  Capsule, Oral) Active. Hydrocortisone (0.25% Cream, External) Active. Medications Reconciled  Social History Illene Regulus, CMA; 10/06/2017 8:45 AM) Alcohol use Occasional alcohol use. Caffeine use Coffee, Tea. Tobacco use Never smoker.  Family History Illene Regulus, [place]; 10/06/2017 8:45 AM) Alcohol Abuse Father. Arthritis Mother. Cerebrovascular Accident Father, Mother. Depression Mother. Diabetes Mellitus Mother. Heart Disease Mother. Heart disease in male family member before age 61 Hypertension Father, Mother. Kidney Disease Mother. Melanoma Father. Respiratory Condition Mother.  Other Problems Lars Mage Spillers, CMA; 10/06/2017 8:45 AM) Anxiety Disorder Arthritis Back Pain Depression Gastroesophageal Reflux Disease Hemorrhoids Hypercholesterolemia Melanoma     Review of Systems (Alisha Spillers CMA; 10/06/2017 8:45 AM) General Present- Appetite Loss, Chills, Fatigue, Night Sweats and Weight Loss. Not Present- Fever and Weight Gain. HEENT Present- Hearing Loss and Seasonal Allergies. Not Present- Earache, Hoarseness, Nose Bleed, Oral Ulcers, Ringing in the Ears, Sinus Pain, Sore Throat, Visual Disturbances, Wears glasses/contact lenses and Yellow Eyes. Respiratory Present- Snoring. Not Present- Bloody sputum, Chronic Cough, Difficulty Breathing and Wheezing. Breast Not Present- Breast Mass, Breast Pain, Nipple Discharge and Skin Changes. Cardiovascular Present- Leg Cramps. Not Present- Chest Pain, Difficulty Breathing Lying Down, Palpitations, Rapid Heart Rate, Shortness of  Breath and Swelling of Extremities. Gastrointestinal Present- Bloody Stool, Change in Bowel Habits, Constipation, Hemorrhoids, Indigestion and Rectal Pain. Not Present- Abdominal Pain, Bloating, Chronic diarrhea, Difficulty Swallowing, Excessive gas, Gets full quickly at meals, Nausea and Vomiting. Male Genitourinary Not Present- Blood in Urine, Change in Urinary Stream, Frequency, Impotence, Nocturia, Painful Urination, Urgency and Urine  Leakage. Musculoskeletal Present- Back Pain, Joint Pain and Joint Stiffness. Not Present- Muscle Pain, Muscle Weakness and Swelling of Extremities. Neurological Present- Numbness and Tingling. Not Present- Decreased Memory, Fainting, Headaches, Seizures, Tremor, Trouble walking and Weakness. Psychiatric Present- Bipolar and Depression. Not Present- Anxiety, Change in Sleep Pattern, Fearful and Frequent crying. Endocrine Not Present- Cold Intolerance, Excessive Hunger, Hair Changes, Heat Intolerance, Hot flashes and New Diabetes. Hematology Not Present- Blood Thinners, Easy Bruising, Excessive bleeding, Gland problems, HIV and Persistent Infections.  Vitals (Alisha Spillers CMA; 10/06/2017 8:46 AM) 10/06/2017 8:46 AM Weight: 162 lb Height: 69in Body Surface Area: 1.89 m Body Mass Index: 23.92 kg/m  Pulse: 70 (Regular)  BP: 132/88 (Sitting, Left Arm, Standard)      Physical Exam Adin Hector MD; 10/06/2017 8:59 AM)  General Mental Status-Alert. General Appearance-Not in acute distress, Not Sickly. Orientation-Oriented X3. Hydration-Well hydrated. Voice-Normal.  Integumentary Global Assessment Upon inspection and palpation of skin surfaces of the - Axillae: non-tender, no inflammation or ulceration, no drainage. and Distribution of scalp and body hair is normal. General Characteristics Temperature - normal warmth is noted.  Head and Neck Head-normocephalic, atraumatic with no lesions or palpable masses. Face Global  Assessment - atraumatic, no absence of expression. Neck Global Assessment - no abnormal movements, no bruit auscultated on the right, no bruit auscultated on the left, no decreased range of motion, non-tender. Trachea-midline. Thyroid Gland Characteristics - non-tender. Note: Well-healed trapezoidal skin flap over left cheek  Eye Eyeball - Left-Extraocular movements intact, No Nystagmus. Eyeball - Right-Extraocular movements intact, No Nystagmus. Cornea - Left-No Hazy. Cornea - Right-No Hazy. Sclera/Conjunctiva - Left-No scleral icterus, No Discharge. Sclera/Conjunctiva - Right-No scleral icterus, No Discharge. Pupil - Left-Direct reaction to light normal. Pupil - Right-Direct reaction to light normal.  ENMT Ears Pinna - Left - no drainage observed, no generalized tenderness observed. Right - no drainage observed, no generalized tenderness observed. Nose and Sinuses External Inspection of the Nose - no destructive lesion observed. Inspection of the nares - Left - quiet respiration. Right - quiet respiration. Mouth and Throat Lips - Upper Lip - no fissures observed, no pallor noted. Lower Lip - no fissures observed, no pallor noted. Nasopharynx - no discharge present. Oral Cavity/Oropharynx - Tongue - no dryness observed. Oral Mucosa - no cyanosis observed. Hypopharynx - no evidence of airway distress observed.  Chest and Lung Exam Inspection Movements - Normal and Symmetrical. Accessory muscles - No use of accessory muscles in breathing. Palpation Palpation of the chest reveals - Non-tender. Auscultation Breath sounds - Normal and Clear.  Cardiovascular Auscultation Rhythm - Regular. Murmurs & Other Heart Sounds - Auscultation of the heart reveals - No Murmurs and No Systolic Clicks.  Abdomen Inspection Inspection of the abdomen reveals - No Visible peristalsis and No Abnormal pulsations. Umbilicus - No Bleeding, No Urine  drainage. Palpation/Percussion Palpation and Percussion of the abdomen reveal - Soft, Non Tender, No Rebound tenderness, No Rigidity (guarding) and No Cutaneous hyperesthesia. Note: Abdomen soft. Nontender. Not distended. No umbilical or incisional hernias. No guarding.  Male Genitourinary Sexual Maturity Tanner 5 - Adult hair pattern and Adult penile size and shape. Note: No inguinal hernias. Normal external genitalia. Epididymi, testes, and spermatic cords normal without any masses.  Rectal Note: Please refer to anoscopy section. Grade 3 hemorrhoids on the right side.  Peripheral Vascular Upper Extremity Inspection - Left - No Cyanotic nailbeds, Not Ischemic. Right - No Cyanotic nailbeds, Not Ischemic.  Neurologic Neurologic evaluation reveals -normal attention span and ability  to concentrate, able to name objects and repeat phrases. Appropriate fund of knowledge , normal sensation and normal coordination. Mental Status Affect - not angry, not paranoid. Cranial Nerves-Normal Bilaterally. Gait-Normal.  Neuropsychiatric Mental status exam performed with findings of-able to articulate well with normal speech/language, rate, volume and coherence, thought content normal with ability to perform basic computations and apply abstract reasoning and no evidence of hallucinations, delusions, obsessions or homicidal/suicidal ideation.  Musculoskeletal Global Assessment Spine, Ribs and Pelvis - no instability, subluxation or laxity. Right Upper Extremity - no instability, subluxation or laxity.  Lymphatic Head & Neck  General Head & Neck Lymphatics: Bilateral - Description - No Localized lymphadenopathy. Axillary  General Axillary Region: Bilateral - Description - No Localized lymphadenopathy. Femoral & Inguinal  Generalized Femoral & Inguinal Lymphatics: Left - Description - No Localized lymphadenopathy. Right - Description - No Localized  lymphadenopathy.    Assessment & Plan Adin Hector MD; 10/06/2017 9:10 AM)  PROLAPSED INTERNAL HEMORRHOIDS, GRADE 4 (K64.3) Impression: Internal bleeding hemorrhoids refractory to banding attempts 3. At least 2 the piles are partially prolapsed out grade 3. Left lateral grade 2.  I think he's exhausted his nonsurgical options. I think he would benefit from harder ligation and Paxene probable partial hemorrhoidectomy of any external component. Outpatient surgery.  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 I cautioned against him doing too much activities proceeded the first 2 weeks. Eventually Toula Moos unrestricted activity by 6 weeks. He's exhausted other options. He is ready to consider surgery. His wife agrees.   INTERNAL BLEEDING HEMORRHOIDS (K64.8)  Current Plans ANOSCOPY, DIAGNOSTIC (34742) Pt Education - Pamphlet Given - The Hemorrhoid Book: discussed with patient and provided information. You are being scheduled for surgery- Our schedulers will call you.  You should hear from our office's scheduling department within 5 working days about the location, date, and time of surgery. We try to make accommodations for patient's preferences in scheduling surgery, but sometimes the OR schedule or the surgeon's schedule prevents [place] from making those accommodations.  If you have not heard from our office [phone]) in 5 working days, call the office and ask for your surgeon's nurse.  If you have other questions about your diagnosis, plan, or surgery, call the office and ask for your surgeon's nurse.  The anatomy & physiology of the anorectal region was discussed. The pathophysiology of hemorrhoids and differential diagnosis was discussed. Natural history risks without surgery was discussed. I stressed the importance of a bowel regimen to have daily soft bowel movements to minimize progression of disease. Interventions such as sclerotherapy & banding were discussed.  The  patient's symptoms are not adequately controlled by medicines and other non-operative treatments. I feel the risks & problems of no surgery outweigh the operative risks; therefore, I recommended surgery to treat the hemorrhoids by ligation, pexy, and possible resection.  Risks such as bleeding, infection, urinary difficulties, need for further treatment, heart attack, death, and other risks were discussed. I noted a good likelihood this will help address the problem. Goals of post-operative recovery were discussed as well. Possibility that this will not correct all symptoms was explained. Post-operative pain, bleeding, constipation, and other problems after surgery were discussed. We will work to minimize complications. Educational handouts further explaining the pathology, treatment options, and bowel regimen were given as well. Questions were answered. The patient expresses understanding & wishes to proceed with surgery.  Pt Education - [facility] Hemorrhoids (Riyah Bardon): discussed with patient and provided information.  ENCOUNTER FOR PREOPERATIVE EXAMINATION FOR  GENERAL SURGICAL PROCEDURE (Z01.818)  Current Plans You are being scheduled for surgery- Our schedulers will call you.  You should hear from our office's scheduling department within 5 working days about the location, date, and time of surgery. We try to make accommodations for patient's preferences in scheduling surgery, but sometimes the OR schedule or the surgeon's schedule prevents [place] from making those accommodations.  If you have not heard from our office [phone]) in 5 working days, call the office and ask for your surgeon's nurse.  If you have other questions about your diagnosis, plan, or surgery, call the office and ask for your surgeon's nurse.  The anatomy and the physiology was discussed. The pathophysiology and natural history of the disease was discussed. Options were discussed and recommendations were made.  Technique, risks, benefits, & alternatives were discussed. Risks such as stroke, heart attack, bleeding, indection, death, and other risks discussed. Questions answered. The patient agrees to proceed. Pt Education - [facility] Rectal Prep for Anorectal outpatient/office surgery: discussed with patient and provided information. Pt Education - [facility] Rectal Surgery HCI (Jarman Litton): discussed with patient and provided information. Pt Education - [facility] Good Bowel Health (Sven Pinheiro)  Adin Hector, MD, FACS, MASCRS Gastrointestinal and Minimally Invasive Surgery    [address], [place], [place] [address] [phone] Main / Paging [phone] Fax

## 2017-10-06 NOTE — H&P (Signed)
Aaron Dyer Documented: 10/06/2017 8:44 AM Location: [facility] Patient #: [id number] DOB: October 07, 1965 Married / Language: Cleophus Molt / Race: White Male  History of Present Illness Aaron Hector MD; 10/06/2017 9:12 AM) The patient is a 52 year old male who presents with hemorrhoids. Note for "Hemorrhoids": ` ` ` Patient sent for surgical consultation at the request of Dr Luis Abed gastroenterology  Chief Complaint: Internal hemorrhoids with persistent bleeding despite numerous band attempts. ` ` The patient is a male with history of rectal bleeding. Had colonoscopy in 2016 with a few small polyps removed. Looks like he had enlarged internal hemorrhoids at least grade 2 on the pictures. Had persistent/worsening rectal bleeding presumed to be hemorrhoids. Underwent banding. Banding fell off. Underwent repeat banding. Still struggling. Surgical consultation requested. He's had some irritable bowel like symptoms with constipation. Followed by gastroenterology in the past. He is on a fiber supplement. Instead moving bowels twice a week he goes intermittently daily. Often has a lot of straining discomfort. He does not smoke. Not diabetic. No prior abdominal surgeries. No other interventions aside from hemorrhoiid banding.  No personal nor family history of GI/colon cancer, inflammatory bowel disease, irritable bowel syndrome, allergy such as Celiac Sprue, dietary/dairy problems, colitis, ulcers nor gastritis. No recent sick contacts/gastroenteritis. No travel outside the country. No changes in diet. No dysphagia to solids or liquids. No significant heartburn or reflux. No hematochezia, hematemesis, coffee ground emesis. No evidence of prior gastric/peptic ulceration.  (Review of systems as stated in this history (HPI) or in the review of systems. Otherwise all other 12 point ROS are negative) ` ` `   Past Surgical History Illene Regulus, CMA;  10/06/2017 8:45 AM) No pertinent past surgical history  Diagnostic Studies History Illene Regulus, CMA; 10/06/2017 8:45 AM) Colonoscopy 1-5 years ago  Allergies Lars Mage Spillers, CMA; 10/06/2017 8:46 AM) Crestor *ANTIHYPERLIPIDEMICS*  Medication History (Alisha Spillers, CMA; 10/06/2017 8:48 AM) RaNITidine HCl (300MG  Tablet, Oral) Active. FLUoxetine HCl (20MG  Capsule, Oral) Active. Hydrocortisone (0.25% Cream, External) Active. Medications Reconciled  Social History Illene Regulus, CMA; 10/06/2017 8:45 AM) Alcohol use Occasional alcohol use. Caffeine use Coffee, Tea. Tobacco use Never smoker.  Family History Illene Regulus, [place]; 10/06/2017 8:45 AM) Alcohol Abuse Father. Arthritis Mother. Cerebrovascular Accident Father, Mother. Depression Mother. Diabetes Mellitus Mother. Heart Disease Mother. Heart disease in male family member before age 75 Hypertension Father, Mother. Kidney Disease Mother. Melanoma Father. Respiratory Condition Mother.  Other Problems Lars Mage Spillers, CMA; 10/06/2017 8:45 AM) Anxiety Disorder Arthritis Back Pain Depression Gastroesophageal Reflux Disease Hemorrhoids Hypercholesterolemia Melanoma     Review of Systems (Alisha Spillers CMA; 10/06/2017 8:45 AM) General Present- Appetite Loss, Chills, Fatigue, Night Sweats and Weight Loss. Not Present- Fever and Weight Gain. HEENT Present- Hearing Loss and Seasonal Allergies. Not Present- Earache, Hoarseness, Nose Bleed, Oral Ulcers, Ringing in the Ears, Sinus Pain, Sore Throat, Visual Disturbances, Wears glasses/contact lenses and Yellow Eyes. Respiratory Present- Snoring. Not Present- Bloody sputum, Chronic Cough, Difficulty Breathing and Wheezing. Breast Not Present- Breast Mass, Breast Pain, Nipple Discharge and Skin Changes. Cardiovascular Present- Leg Cramps. Not Present- Chest Pain, Difficulty Breathing Lying Down, Palpitations, Rapid Heart Rate, Shortness of  Breath and Swelling of Extremities. Gastrointestinal Present- Bloody Stool, Change in Bowel Habits, Constipation, Hemorrhoids, Indigestion and Rectal Pain. Not Present- Abdominal Pain, Bloating, Chronic diarrhea, Difficulty Swallowing, Excessive gas, Gets full quickly at meals, Nausea and Vomiting. Male Genitourinary Not Present- Blood in Urine, Change in Urinary Stream, Frequency, Impotence, Nocturia, Painful Urination, Urgency and Urine  Leakage. Musculoskeletal Present- Back Pain, Joint Pain and Joint Stiffness. Not Present- Muscle Pain, Muscle Weakness and Swelling of Extremities. Neurological Present- Numbness and Tingling. Not Present- Decreased Memory, Fainting, Headaches, Seizures, Tremor, Trouble walking and Weakness. Psychiatric Present- Bipolar and Depression. Not Present- Anxiety, Change in Sleep Pattern, Fearful and Frequent crying. Endocrine Not Present- Cold Intolerance, Excessive Hunger, Hair Changes, Heat Intolerance, Hot flashes and New Diabetes. Hematology Not Present- Blood Thinners, Easy Bruising, Excessive bleeding, Gland problems, HIV and Persistent Infections.  Vitals (Alisha Spillers CMA; 10/06/2017 8:46 AM) 10/06/2017 8:46 AM Weight: 162 lb Height: 69in Body Surface Area: 1.89 m Body Mass Index: 23.92 kg/m  Pulse: 70 (Regular)  BP: 132/88 (Sitting, Left Arm, Standard)      Physical Exam Aaron Hector MD; 10/06/2017 8:59 AM)  General Mental Status-Alert. General Appearance-Not in acute distress, Not Sickly. Orientation-Oriented X3. Hydration-Well hydrated. Voice-Normal.  Integumentary Global Assessment Upon inspection and palpation of skin surfaces of the - Axillae: non-tender, no inflammation or ulceration, no drainage. and Distribution of scalp and body hair is normal. General Characteristics Temperature - normal warmth is noted.  Head and Neck Head-normocephalic, atraumatic with no lesions or palpable masses. Face Global  Assessment - atraumatic, no absence of expression. Neck Global Assessment - no abnormal movements, no bruit auscultated on the right, no bruit auscultated on the left, no decreased range of motion, non-tender. Trachea-midline. Thyroid Gland Characteristics - non-tender. Note: Well-healed trapezoidal skin flap over left cheek  Eye Eyeball - Left-Extraocular movements intact, No Nystagmus. Eyeball - Right-Extraocular movements intact, No Nystagmus. Cornea - Left-No Hazy. Cornea - Right-No Hazy. Sclera/Conjunctiva - Left-No scleral icterus, No Discharge. Sclera/Conjunctiva - Right-No scleral icterus, No Discharge. Pupil - Left-Direct reaction to light normal. Pupil - Right-Direct reaction to light normal.  ENMT Ears Pinna - Left - no drainage observed, no generalized tenderness observed. Right - no drainage observed, no generalized tenderness observed. Nose and Sinuses External Inspection of the Nose - no destructive lesion observed. Inspection of the nares - Left - quiet respiration. Right - quiet respiration. Mouth and Throat Lips - Upper Lip - no fissures observed, no pallor noted. Lower Lip - no fissures observed, no pallor noted. Nasopharynx - no discharge present. Oral Cavity/Oropharynx - Tongue - no dryness observed. Oral Mucosa - no cyanosis observed. Hypopharynx - no evidence of airway distress observed.  Chest and Lung Exam Inspection Movements - Normal and Symmetrical. Accessory muscles - No use of accessory muscles in breathing. Palpation Palpation of the chest reveals - Non-tender. Auscultation Breath sounds - Normal and Clear.  Cardiovascular Auscultation Rhythm - Regular. Murmurs & Other Heart Sounds - Auscultation of the heart reveals - No Murmurs and No Systolic Clicks.  Abdomen Inspection Inspection of the abdomen reveals - No Visible peristalsis and No Abnormal pulsations. Umbilicus - No Bleeding, No Urine  drainage. Palpation/Percussion Palpation and Percussion of the abdomen reveal - Soft, Non Tender, No Rebound tenderness, No Rigidity (guarding) and No Cutaneous hyperesthesia. Note: Abdomen soft. Nontender. Not distended. No umbilical or incisional hernias. No guarding.  Male Genitourinary Sexual Maturity Tanner 5 - Adult hair pattern and Adult penile size and shape. Note: No inguinal hernias. Normal external genitalia. Epididymi, testes, and spermatic cords normal without any masses.  Rectal Note: Please refer to anoscopy section. Grade 3 hemorrhoids on the right side.  Peripheral Vascular Upper Extremity Inspection - Left - No Cyanotic nailbeds, Not Ischemic. Right - No Cyanotic nailbeds, Not Ischemic.  Neurologic Neurologic evaluation reveals -normal attention span and ability  to concentrate, able to name objects and repeat phrases. Appropriate fund of knowledge , normal sensation and normal coordination. Mental Status Affect - not angry, not paranoid. Cranial Nerves-Normal Bilaterally. Gait-Normal.  Neuropsychiatric Mental status exam performed with findings of-able to articulate well with normal speech/language, rate, volume and coherence, thought content normal with ability to perform basic computations and apply abstract reasoning and no evidence of hallucinations, delusions, obsessions or homicidal/suicidal ideation.  Musculoskeletal Global Assessment Spine, Ribs and Pelvis - no instability, subluxation or laxity. Right Upper Extremity - no instability, subluxation or laxity.  Lymphatic Head & Neck  General Head & Neck Lymphatics: Bilateral - Description - No Localized lymphadenopathy. Axillary  General Axillary Region: Bilateral - Description - No Localized lymphadenopathy. Femoral & Inguinal  Generalized Femoral & Inguinal Lymphatics: Left - Description - No Localized lymphadenopathy. Right - Description - No Localized  lymphadenopathy.    Assessment & Plan Aaron Hector MD; 10/06/2017 9:10 AM)  PROLAPSED INTERNAL HEMORRHOIDS, GRADE 4 (K64.3) Impression: Internal bleeding hemorrhoids refractory to banding attempts 3. At least 2 the piles are partially prolapsed out grade 3. Left lateral grade 2.  I think he's exhausted his nonsurgical options. I think he would benefit from harder ligation and Paxene probable partial hemorrhoidectomy of any external component. Outpatient surgery.  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 I cautioned against him doing too much activities proceeded the first 2 weeks. Eventually Toula Moos unrestricted activity by 6 weeks. He's exhausted other options. He is ready to consider surgery. His wife agrees.   INTERNAL BLEEDING HEMORRHOIDS (K64.8)  Current Plans ANOSCOPY, DIAGNOSTIC (41962) Pt Education - Pamphlet Given - The Hemorrhoid Book: discussed with patient and provided information. You are being scheduled for surgery- Our schedulers will call you.  You should hear from our office's scheduling department within 5 working days about the location, date, and time of surgery. We try to make accommodations for patient's preferences in scheduling surgery, but sometimes the OR schedule or the surgeon's schedule prevents [place] from making those accommodations.  If you have not heard from our office [phone]) in 5 working days, call the office and ask for your surgeon's nurse.  If you have other questions about your diagnosis, plan, or surgery, call the office and ask for your surgeon's nurse.  The anatomy & physiology of the anorectal region was discussed. The pathophysiology of hemorrhoids and differential diagnosis was discussed. Natural history risks without surgery was discussed. I stressed the importance of a bowel regimen to have daily soft bowel movements to minimize progression of disease. Interventions such as sclerotherapy & banding were discussed.  The  patient's symptoms are not adequately controlled by medicines and other non-operative treatments. I feel the risks & problems of no surgery outweigh the operative risks; therefore, I recommended surgery to treat the hemorrhoids by ligation, pexy, and possible resection.  Risks such as bleeding, infection, urinary difficulties, need for further treatment, heart attack, death, and other risks were discussed. I noted a good likelihood this will help address the problem. Goals of post-operative recovery were discussed as well. Possibility that this will not correct all symptoms was explained. Post-operative pain, bleeding, constipation, and other problems after surgery were discussed. We will work to minimize complications. Educational handouts further explaining the pathology, treatment options, and bowel regimen were given as well. Questions were answered. The patient expresses understanding & wishes to proceed with surgery.  Pt Education - [facility]CS Hemorrhoids (Gitel Beste): discussed with patient and provided information.  ENCOUNTER FOR PREOPERATIVE EXAMINATION FOR  GENERAL SURGICAL PROCEDURE (Z01.818)  Current Plans You are being scheduled for surgery- Our schedulers will call you.  You should hear from our office's scheduling department within 5 working days about the location, date, and time of surgery. We try to make accommodations for patient's preferences in scheduling surgery, but sometimes the OR schedule or the surgeon's schedule prevents [place] from making those accommodations.  If you have not heard from our office [phone]) in 5 working days, call the office and ask for your surgeon's nurse.  If you have other questions about your diagnosis, plan, or surgery, call the office and ask for your surgeon's nurse.  The anatomy and the physiology was discussed. The pathophysiology and natural history of the disease was discussed. Options were discussed and recommendations were made.  Technique, risks, benefits, & alternatives were discussed. Risks such as stroke, heart attack, bleeding, indection, death, and other risks discussed. Questions answered. The patient agrees to proceed. Pt Education - [facility]CS Rectal Prep for Anorectal outpatient/office surgery: discussed with patient and provided information. Pt Education - CCS Rectal Surgery HCI (Alando Colleran): discussed with patient and provided information. Pt Education - CCS Good Bowel Health (Christhoper Busbee)  Aaron Hector, MD, FACS, MASCRS Gastrointestinal and Minimally Invasive Surgery    [address] [place], [place] [address] [phone] Main / Paging [phone] Fax

## 2017-10-14 ENCOUNTER — Encounter (HOSPITAL_BASED_OUTPATIENT_CLINIC_OR_DEPARTMENT_OTHER): Payer: Self-pay | Admitting: *Deleted

## 2017-10-14 NOTE — Progress Notes (Signed)
Spoke w/ pt via phone for pre-op interview.  Npo after mn.  Arrive at [facility] [employer]  Will take wellbutrin and prozac am dos w/ sips of water.  Pt verbalized understanding rectal prep instructions given to him from dr gross office, no further questions.

## 2017-10-17 ENCOUNTER — Encounter (HOSPITAL_BASED_OUTPATIENT_CLINIC_OR_DEPARTMENT_OTHER)
Admission: RE | Disposition: A | Discharge status: Home / Self Care | Payer: Self-pay | Source: Ambulatory Visit | Attending: Surgery

## 2017-10-17 ENCOUNTER — Telehealth: Payer: Self-pay | Admitting: Internal Medicine

## 2017-10-17 ENCOUNTER — Ambulatory Visit (HOSPITAL_BASED_OUTPATIENT_CLINIC_OR_DEPARTMENT_OTHER): Payer: BLUE CROSS/BLUE SHIELD | Admitting: Certified Registered"

## 2017-10-17 ENCOUNTER — Ambulatory Visit (HOSPITAL_BASED_OUTPATIENT_CLINIC_OR_DEPARTMENT_OTHER)
Admission: RE | Admit: 2017-10-17 | Discharge: 2017-10-17 | Disposition: A | Discharge status: Home / Self Care | Payer: BLUE CROSS/BLUE SHIELD | Source: Ambulatory Visit | Attending: Surgery | Admitting: Surgery

## 2017-10-17 ENCOUNTER — Encounter (HOSPITAL_BASED_OUTPATIENT_CLINIC_OR_DEPARTMENT_OTHER): Payer: Self-pay | Admitting: Emergency Medicine

## 2017-10-17 DIAGNOSIS — Z809 Family history of malignant neoplasm, unspecified: Secondary | ICD-10-CM | POA: Diagnosis not present

## 2017-10-17 DIAGNOSIS — Z833 Family history of diabetes mellitus: Secondary | ICD-10-CM | POA: Insufficient documentation

## 2017-10-17 DIAGNOSIS — F329 Major depressive disorder, single episode, unspecified: Secondary | ICD-10-CM | POA: Insufficient documentation

## 2017-10-17 DIAGNOSIS — Z79899 Other long term (current) drug therapy: Secondary | ICD-10-CM | POA: Diagnosis not present

## 2017-10-17 DIAGNOSIS — M549 Dorsalgia, unspecified: Secondary | ICD-10-CM | POA: Diagnosis not present

## 2017-10-17 DIAGNOSIS — Z823 Family history of stroke: Secondary | ICD-10-CM | POA: Diagnosis not present

## 2017-10-17 DIAGNOSIS — Z8261 Family history of arthritis: Secondary | ICD-10-CM | POA: Diagnosis not present

## 2017-10-17 DIAGNOSIS — K642 Third degree hemorrhoids: Secondary | ICD-10-CM | POA: Diagnosis present

## 2017-10-17 DIAGNOSIS — Z8249 Family history of ischemic heart disease and other diseases of the circulatory system: Secondary | ICD-10-CM | POA: Diagnosis not present

## 2017-10-17 DIAGNOSIS — E7800 Pure hypercholesterolemia, unspecified: Secondary | ICD-10-CM | POA: Diagnosis not present

## 2017-10-17 DIAGNOSIS — K643 Fourth degree hemorrhoids: Secondary | ICD-10-CM | POA: Insufficient documentation

## 2017-10-17 DIAGNOSIS — Z85820 Personal history of malignant melanoma of skin: Secondary | ICD-10-CM | POA: Insufficient documentation

## 2017-10-17 DIAGNOSIS — Z818 Family history of other mental and behavioral disorders: Secondary | ICD-10-CM | POA: Diagnosis not present

## 2017-10-17 DIAGNOSIS — K219 Gastro-esophageal reflux disease without esophagitis: Secondary | ICD-10-CM | POA: Insufficient documentation

## 2017-10-17 DIAGNOSIS — F419 Anxiety disorder, unspecified: Secondary | ICD-10-CM | POA: Diagnosis not present

## 2017-10-17 DIAGNOSIS — Z888 Allergy status to other drugs, medicaments and biological substances status: Secondary | ICD-10-CM | POA: Diagnosis not present

## 2017-10-17 DIAGNOSIS — Z836 Family history of other diseases of the respiratory system: Secondary | ICD-10-CM | POA: Diagnosis not present

## 2017-10-17 DIAGNOSIS — K649 Unspecified hemorrhoids: Secondary | ICD-10-CM | POA: Diagnosis present

## 2017-10-17 DIAGNOSIS — Z86010 Personal history of colonic polyps: Secondary | ICD-10-CM | POA: Insufficient documentation

## 2017-10-17 DIAGNOSIS — K5900 Constipation, unspecified: Secondary | ICD-10-CM | POA: Insufficient documentation

## 2017-10-17 DIAGNOSIS — D649 Anemia, unspecified: Secondary | ICD-10-CM | POA: Diagnosis not present

## 2017-10-17 DIAGNOSIS — Z811 Family history of alcohol abuse and dependence: Secondary | ICD-10-CM | POA: Diagnosis not present

## 2017-10-17 DIAGNOSIS — M1990 Unspecified osteoarthritis, unspecified site: Secondary | ICD-10-CM | POA: Insufficient documentation

## 2017-10-17 HISTORY — DX: Other specified postprocedural states: Z98.890

## 2017-10-17 HISTORY — DX: Personal history of other medical treatment: Z92.89

## 2017-10-17 HISTORY — DX: Other specified postprocedural states: Z85.820

## 2017-10-17 HISTORY — DX: Fourth degree hemorrhoids: K64.3

## 2017-10-17 HISTORY — PX: EVALUATION UNDER ANESTHESIA WITH HEMORRHOIDECTOMY: SHX5624

## 2017-10-17 HISTORY — DX: Personal history of colon polyps: Z86.010

## 2017-10-17 HISTORY — DX: Personal history of adenomatous and serrated colon polyps: Z86.0101

## 2017-10-17 HISTORY — DX: Personal history of other malignant neoplasm of skin: Z85.828

## 2017-10-17 HISTORY — DX: Unspecified osteoarthritis, unspecified site: M19.90

## 2017-10-17 SURGERY — EXAM UNDER ANESTHESIA WITH HEMORRHOIDECTOMY
Anesthesia: General | Site: Rectum

## 2017-10-17 MED ORDER — LIDOCAINE 2% (20 MG/ML) 5 ML SYRINGE
INTRAMUSCULAR | Status: DC | PRN
  Administered 2017-10-17: 80 mg via INTRAVENOUS

## 2017-10-17 MED ORDER — ACETAMINOPHEN 500 MG PO TABS
1000.0000 mg | ORAL_TABLET | ORAL | Status: AC
  Administered 2017-10-17: 1000 mg via ORAL
  Filled 2017-10-17: qty 2

## 2017-10-17 MED ORDER — CEFAZOLIN SODIUM-DEXTROSE 2-4 GM/100ML-% IV SOLN
INTRAVENOUS | Status: AC
  Filled 2017-10-17: qty 100

## 2017-10-17 MED ORDER — SODIUM CHLORIDE 0.9 % IV SOLN
250.0000 mL | INTRAVENOUS | Status: DC | PRN
  Filled 2017-10-17: qty 250

## 2017-10-17 MED ORDER — PROPOFOL 10 MG/ML IV BOLUS
INTRAVENOUS | Status: AC
  Filled 2017-10-17: qty 20

## 2017-10-17 MED ORDER — HYDROMORPHONE HCL 1 MG/ML IJ SOLN
0.2500 mg | INTRAMUSCULAR | Status: DC | PRN
  Filled 2017-10-17: qty 0.5

## 2017-10-17 MED ORDER — CELECOXIB 200 MG PO CAPS
200.0000 mg | ORAL_CAPSULE | ORAL | Status: AC
  Administered 2017-10-17: 200 mg via ORAL
  Filled 2017-10-17: qty 1

## 2017-10-17 MED ORDER — FENTANYL CITRATE (PF) 100 MCG/2ML IJ SOLN
INTRAMUSCULAR | Status: AC
  Filled 2017-10-17: qty 2

## 2017-10-17 MED ORDER — CEFAZOLIN SODIUM-DEXTROSE 2-4 GM/100ML-% IV SOLN
2.0000 g | INTRAVENOUS | Status: AC
  Administered 2017-10-17: 2 g via INTRAVENOUS
  Filled 2017-10-17: qty 100

## 2017-10-17 MED ORDER — PROPOFOL 10 MG/ML IV BOLUS
INTRAVENOUS | Status: DC | PRN
  Administered 2017-10-17: 30 mg via INTRAVENOUS
  Administered 2017-10-17: 150 mg via INTRAVENOUS

## 2017-10-17 MED ORDER — OXYCODONE HCL 5 MG PO TABS
ORAL_TABLET | ORAL | Status: AC
  Filled 2017-10-17: qty 1

## 2017-10-17 MED ORDER — KETOROLAC TROMETHAMINE 30 MG/ML IJ SOLN
30.0000 mg | Freq: Once | INTRAMUSCULAR | Status: DC | PRN
  Filled 2017-10-17: qty 1

## 2017-10-17 MED ORDER — GABAPENTIN 300 MG PO CAPS
ORAL_CAPSULE | ORAL | Status: AC
  Filled 2017-10-17: qty 1

## 2017-10-17 MED ORDER — GABAPENTIN 300 MG PO CAPS
300.0000 mg | ORAL_CAPSULE | ORAL | Status: AC
  Administered 2017-10-17: 300 mg via ORAL
  Filled 2017-10-17: qty 1

## 2017-10-17 MED ORDER — LACTATED RINGERS IV SOLN
INTRAVENOUS | Status: DC
  Administered 2017-10-17 (×2): via INTRAVENOUS
  Filled 2017-10-17: qty 1000

## 2017-10-17 MED ORDER — DEXAMETHASONE SODIUM PHOSPHATE 10 MG/ML IJ SOLN
INTRAMUSCULAR | Status: DC | PRN
  Administered 2017-10-17: 10 mg via INTRAVENOUS

## 2017-10-17 MED ORDER — BUPIVACAINE LIPOSOME 1.3 % IJ SUSP
20.0000 mL | Freq: Once | INTRAMUSCULAR | Status: DC
  Filled 2017-10-17: qty 20

## 2017-10-17 MED ORDER — PROMETHAZINE HCL 25 MG/ML IJ SOLN
6.2500 mg | INTRAMUSCULAR | Status: DC | PRN
  Filled 2017-10-17: qty 1

## 2017-10-17 MED ORDER — SODIUM CHLORIDE 0.9% FLUSH
3.0000 mL | INTRAVENOUS | Status: DC | PRN
  Filled 2017-10-17: qty 3

## 2017-10-17 MED ORDER — SUGAMMADEX SODIUM 200 MG/2ML IV SOLN
INTRAVENOUS | Status: DC | PRN
  Administered 2017-10-17: 100 mg via INTRAVENOUS

## 2017-10-17 MED ORDER — BUPIVACAINE LIPOSOME 1.3 % IJ SUSP
INTRAMUSCULAR | Status: DC | PRN
  Administered 2017-10-17: 20 mL

## 2017-10-17 MED ORDER — OXYCODONE HCL 5 MG PO TABS
5.0000 mg | ORAL_TABLET | ORAL | Status: DC | PRN
  Administered 2017-10-17: 5 mg via ORAL
  Filled 2017-10-17: qty 2

## 2017-10-17 MED ORDER — ACETAMINOPHEN 650 MG RE SUPP
650.0000 mg | RECTAL | Status: DC | PRN
  Filled 2017-10-17: qty 1

## 2017-10-17 MED ORDER — MIDAZOLAM HCL 5 MG/5ML IJ SOLN
INTRAMUSCULAR | Status: DC | PRN
  Administered 2017-10-17: 2 mg via INTRAVENOUS

## 2017-10-17 MED ORDER — FENTANYL CITRATE (PF) 100 MCG/2ML IJ SOLN
25.0000 ug | INTRAMUSCULAR | Status: DC | PRN
  Filled 2017-10-17: qty 1

## 2017-10-17 MED ORDER — AMBULATORY NON FORMULARY MEDICATION: 1.0000 "application " | Freq: Four times a day (QID) | 2 refills | Status: DC

## 2017-10-17 MED ORDER — CELECOXIB 200 MG PO CAPS
ORAL_CAPSULE | ORAL | Status: AC
  Filled 2017-10-17: qty 1

## 2017-10-17 MED ORDER — EPHEDRINE 5 MG/ML INJ
INTRAVENOUS | Status: AC
  Filled 2017-10-17: qty 10

## 2017-10-17 MED ORDER — ONDANSETRON HCL 4 MG/2ML IJ SOLN
INTRAMUSCULAR | Status: AC
  Filled 2017-10-17: qty 2

## 2017-10-17 MED ORDER — ROCURONIUM BROMIDE 100 MG/10ML IV SOLN
INTRAVENOUS | Status: DC | PRN
  Administered 2017-10-17: 50 mg via INTRAVENOUS

## 2017-10-17 MED ORDER — ACETAMINOPHEN 500 MG PO TABS
ORAL_TABLET | ORAL | Status: AC
  Filled 2017-10-17: qty 2

## 2017-10-17 MED ORDER — BUPIVACAINE-EPINEPHRINE 0.25% -1:200000 IJ SOLN
INTRAMUSCULAR | Status: DC | PRN
  Administered 2017-10-17: 30 mL

## 2017-10-17 MED ORDER — SODIUM CHLORIDE 0.9% FLUSH
3.0000 mL | Freq: Two times a day (BID) | INTRAVENOUS | Status: DC
  Filled 2017-10-17: qty 3

## 2017-10-17 MED ORDER — ONDANSETRON HCL 4 MG/2ML IJ SOLN
INTRAMUSCULAR | Status: DC | PRN
  Administered 2017-10-17: 4 mg via INTRAVENOUS

## 2017-10-17 MED ORDER — ACETAMINOPHEN 325 MG PO TABS
650.0000 mg | ORAL_TABLET | ORAL | Status: DC | PRN
  Filled 2017-10-17: qty 2

## 2017-10-17 MED ORDER — CHLORHEXIDINE GLUCONATE CLOTH 2 % EX PADS
6.0000 | MEDICATED_PAD | Freq: Once | CUTANEOUS | Status: DC
  Filled 2017-10-17: qty 6

## 2017-10-17 MED ORDER — MIDAZOLAM HCL 2 MG/2ML IJ SOLN
INTRAMUSCULAR | Status: AC
  Filled 2017-10-17: qty 2

## 2017-10-17 MED ORDER — SUGAMMADEX SODIUM 200 MG/2ML IV SOLN
INTRAVENOUS | Status: AC
  Filled 2017-10-17: qty 2

## 2017-10-17 MED ORDER — DEXAMETHASONE SODIUM PHOSPHATE 10 MG/ML IJ SOLN
INTRAMUSCULAR | Status: AC
  Filled 2017-10-17: qty 1

## 2017-10-17 MED ORDER — METRONIDAZOLE IN NACL 5-0.79 MG/ML-% IV SOLN
500.0000 mg | INTRAVENOUS | Status: AC
  Administered 2017-10-17: 500 mg via INTRAVENOUS
  Filled 2017-10-17 (×2): qty 100

## 2017-10-17 MED ORDER — FENTANYL CITRATE (PF) 100 MCG/2ML IJ SOLN
INTRAMUSCULAR | Status: DC | PRN
  Administered 2017-10-17 (×2): 50 ug via INTRAVENOUS

## 2017-10-17 MED ORDER — OXYCODONE HCL 5 MG PO TABS: 5.0000 mg | ORAL_TABLET | Freq: Four times a day (QID) | ORAL | 0 refills | Status: DC | PRN

## 2017-10-17 MED ORDER — DIBUCAINE 1 % RE OINT
TOPICAL_OINTMENT | RECTAL | Status: DC | PRN
  Administered 2017-10-17: 1 via RECTAL

## 2017-10-17 SURGICAL SUPPLY — 55 items
BENZOIN TINCTURE PRP APPL 2/3 (GAUZE/BANDAGES/DRESSINGS) ×3 IMPLANT
BLADE HEX COATED 2.75 (ELECTRODE) ×3 IMPLANT
BLADE SURG 10 STRL SS (BLADE) IMPLANT
BLADE SURG 15 STRL LF DISP TIS (BLADE) ×1 IMPLANT
BLADE SURG 15 STRL SS (BLADE) ×2
BRIEF STRETCH FOR OB PAD LRG (UNDERPADS AND DIAPERS) ×3 IMPLANT
CANISTER SUCTION 1200CC (MISCELLANEOUS) ×3 IMPLANT
COVER BACK TABLE 60X90IN (DRAPES) ×3 IMPLANT
COVER MAYO STAND STRL (DRAPES) ×3 IMPLANT
DECANTER SPIKE VIAL GLASS SM (MISCELLANEOUS) ×3 IMPLANT
DRAPE LAPAROTOMY 100X72 PEDS (DRAPES) ×3 IMPLANT
DRAPE LG THREE QUARTER DISP (DRAPES) IMPLANT
DRAPE UNDERBUTTOCKS STRL (DRAPE) IMPLANT
ELECT NEEDLE TIP 2.8 STRL (NEEDLE) IMPLANT
ELECT REM PT RETURN 9FT ADLT (ELECTROSURGICAL) ×3
ELECTRODE REM PT RTRN 9FT ADLT (ELECTROSURGICAL) ×1 IMPLANT
GLOVE BIO SURGEON STRL SZ 6.5 (GLOVE) ×2 IMPLANT
GLOVE BIO SURGEONS STRL SZ 6.5 (GLOVE) ×1
GLOVE BIOGEL PI IND STRL 6.5 (GLOVE) ×1 IMPLANT
GLOVE BIOGEL PI IND STRL 7.5 (GLOVE) ×1 IMPLANT
GLOVE BIOGEL PI INDICATOR 6.5 (GLOVE) ×2
GLOVE BIOGEL PI INDICATOR 7.5 (GLOVE) ×2
GLOVE ECLIPSE 8.0 STRL XLNG CF (GLOVE) ×3 IMPLANT
GLOVE INDICATOR 8.0 STRL GRN (GLOVE) ×3 IMPLANT
GOWN STRL REUS W/TWL XL LVL3 (GOWN DISPOSABLE) ×3 IMPLANT
IV CATH PLACEMENT 20 GA (IV SOLUTION) ×3 IMPLANT
KIT TURNOVER CYSTO (KITS) ×3 IMPLANT
LEGGING LITHOTOMY PAIR STRL (DRAPES) IMPLANT
NEEDLE HYPO 22GX1.5 SAFETY (NEEDLE) ×3 IMPLANT
NS IRRIG 500ML POUR BTL (IV SOLUTION) ×3 IMPLANT
PACK BASIN DAY SURGERY FS (CUSTOM PROCEDURE TRAY) ×3 IMPLANT
PAD ABD 8X10 STRL (GAUZE/BANDAGES/DRESSINGS) ×3 IMPLANT
PAD PREP 24X48 CUFFED NSTRL (MISCELLANEOUS) IMPLANT
PENCIL BUTTON HOLSTER BLD 10FT (ELECTRODE) ×3 IMPLANT
SCRUB TECHNI CARE 4 OZ NO DYE (MISCELLANEOUS) ×3 IMPLANT
SHEARS HARMONIC 9CM CVD (BLADE) IMPLANT
SURGILUBE 2OZ TUBE FLIPTOP (MISCELLANEOUS) ×3 IMPLANT
SUT CHROMIC 2 0 SH (SUTURE) IMPLANT
SUT CHROMIC 3 0 SH 27 (SUTURE) IMPLANT
SUT VIC AB 2-0 SH 27 (SUTURE)
SUT VIC AB 2-0 SH 27XBRD (SUTURE) IMPLANT
SUT VIC AB 2-0 UR6 27 (SUTURE) ×15 IMPLANT
SUT VICRYL 0 UR6 27IN ABS (SUTURE) IMPLANT
SUT VICRYL AB 2 0 TIE (SUTURE) IMPLANT
SUT VICRYL AB 2 0 TIES (SUTURE)
SYR 20CC LL (SYRINGE) ×3 IMPLANT
SYR BULB IRRIGATION 50ML (SYRINGE) ×3 IMPLANT
SYR CONTROL 10ML LL (SYRINGE) IMPLANT
TAPE CLOTH 3X10 TAN LF (GAUZE/BANDAGES/DRESSINGS) ×3 IMPLANT
TOWEL OR 17X24 6PK STRL BLUE (TOWEL DISPOSABLE) ×6 IMPLANT
TRAY DSU PREP LF (CUSTOM PROCEDURE TRAY) ×3 IMPLANT
TUBE CONNECTING 12'X1/4 (SUCTIONS) ×1
TUBE CONNECTING 12X1/4 (SUCTIONS) ×2 IMPLANT
UNDERPAD 30X30 (UNDERPADS AND DIAPERS) ×3 IMPLANT
YANKAUER SUCT BULB TIP NO VENT (SUCTIONS) ×3 IMPLANT

## 2017-10-17 NOTE — Interval H&P Note (Signed)
History and Physical Interval Note:  10/17/2017 9:11 AM  Aaron Dyer  has presented today for surgery, with the diagnosis of HEMORRHOIDS PROLAPSED GRADE 4 WITH BLEEDING AND PAIN  The various methods of treatment have been discussed with the patient and family. After consideration of risks, benefits and other options for treatment, the patient has consented to  Procedure(s) with comments: EXAM UNDER ANESTHESIA WITH HEMORRHOIDECTOMY AND HEMORRHOIDAL LIGATION/PEXY (N/A) - GENERAL AND LOCAL as a surgical intervention .  The patient's history has been reviewed, patient examined, no change in status, stable for surgery.  I have reviewed the patient's chart and labs.  Questions were answered to the patient's satisfaction.    I have re-reviewed the the patient's records, history, medications, and allergies.  I have re-examined the patient.  I again discussed intraoperative plans and goals of post-operative recovery.  The patient agrees to proceed.  BLAND RUDZINSKI  1965-06-22 [id number]  Patient Care Team: Mosie Lukes, MD as PCP - General (Family Medicine) Michael Boston, MD as Consulting Physician (General Surgery) Pyrtle, Lajuan Lines, MD as Consulting Physician (Gastroenterology)  Patient Active Problem List   Diagnosis Date Noted  . Blood in stool 06/04/2017  . Night sweats 06/04/2017  . Change in bowel habits 06/04/2017  . Anemia 06/04/2017  . Preventative health care 06/04/2017  . Myalgia 06/03/2017  . 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 Squamous cell carcinoma of thigh, right 05/14/2017  . Constipation 09/08/2015  . Tick bite 09/08/2015  . Cold intolerance 06/04/2015  . Squamous cell carcinoma of left hand 05/11/2015  . Diverticulosis of colon without hemorrhage 12/18/2014  . Acute lymphadenitis of lower extremity 10/29/2014  . Elevated WBCs 04/10/2014  . Neck pain, bilateral 11/29/2013  . ED (erectile dysfunction) 08/22/2013  . Back pain  03/15/2013  . Laceration of skin of scalp 09/05/2012  . GERD (gastroesophageal reflux disease) 08/26/2012  . Depression 08/26/2012  . Melanoma of face ([place]) 08/04/2012  . Lipoma 04/21/2012  . Sun-damaged skin 04/21/2012  . Chest pain 11/19/2011  . Colon polyps 05/28/2011  . Insomnia 05/28/2011  . Urinary frequency 04/30/2011  . Hematuria 04/30/2011  . Reflux 04/29/2011  . Prolapsed internal hemorrhoids, grade 3 04/29/2011  . Anxiety and depression   . Skin cancer   . Hyperlipidemia     Past Medical History:  Diagnosis Date  . Anxiety and depression   . Arthritis    neck  . Diverticulosis of colon without hemorrhage 12/18/2014  . ED (erectile dysfunction)   . GERD (gastroesophageal reflux disease)   . History of adenomatous polyp of colon   . History of adenomatous polyp of colon   . History of exercise stress test 06/09/2006   ECHO ETT-- NORMAL  . History of melanoma excision    nonmalignant and malignant   . History of nonmelanoma skin cancer dermatologist-  dr williford   multiple areas on body,  SCC majority  . Hyperlipidemia   . IBS (irritable bowel syndrome)   . Prolapsed internal hemorrhoids, grade 4     Past Surgical History:  Procedure Laterality Date  . COLONOSCOPY  last one 11-10-2014  dr pyrtle  . EXCISION SEBACEOUS CYST  10-18-2013   @[facility]    ON BACK  . FLAP CLOSURE LEFT CHEEK POST WLE  09-18-2012    @[facility]   . MOHS SURGERY  X2 2010   right ear  . WIDE LOCAL EXCISION LEFT CHEEK AND LEFT CERVICAL SENITNAL LYMPH NODE BX  09-07-2012    @[facility]     Social History   Socioeconomic History  . Marital status: Married    Spouse name: Sharyn Lull  . Number of children: 2  . Years of education: Not on file  . Highest education level: Not on file  Occupational History  . Occupation: Designer, multimedia  . Financial resource strain: Not on file  . Food insecurity:    Worry: Not on file    Inability: Not on file  . Transportation needs:    Medical: Not on  file    Non-medical: Not on file  Tobacco Use  . Smoking status: Never Smoker  . Smokeless tobacco: Never Used  Substance and Sexual Activity  . Alcohol use: Yes    Alcohol/week: 0.0 oz    Frequency: Never    Comment: OCCASIONAL  . Drug use: No  . Sexual activity: Not on file  Lifestyle  . Physical activity:    Days per week: Not on file    Minutes per session: Not on file  . Stress: Not on file  Relationships  . Social connections:    Talks on phone: Not on file    Gets together: Not on file    Attends religious service: Not on file    Active member of club or organization: Not on file    Attends meetings of clubs or organizations: Not on file    Relationship status: Not on file  . Intimate partner violence:    Fear of current or ex partner: Not on file    Emotionally abused: Not on file    Physically abused: Not on file    Forced sexual activity: Not on file  Other Topics Concern  . Not on file  Social History Narrative  . Not on file    Family History  Problem Relation Age of Onset  . COPD Mother   . Emphysema Mother   . Stroke Mother        X 2  . Heart disease Mother        CABG in her 60s  . Hyperlipidemia Mother   . Hypertension Mother   . Diabetes Mother        type 2  . Cancer Father        melanoma  . Stroke Father        4  . Hypertension Father   . Alcohol abuse Father   . Heart disease Father   . Diabetes Maternal Grandmother        type 2  . Heart disease Maternal Grandmother        CHF  . Heart attack Maternal Grandfather   . Heart disease Paternal Grandmother   . Heart disease Paternal Grandfather   . Colon cancer Neg Hx   . Colon polyps Neg Hx     Medications Prior to Admission  Medication Sig Dispense Refill Last Dose  . buPROPion (WELLBUTRIN XL) 300 MG 24 hr tablet Take 1 tablet (300 mg total) by mouth daily. (Patient taking differently: Take 300 mg by mouth every morning. ) 90 tablet 3 10/16/2017 at Unknown time  .  cyclobenzaprine (FLEXERIL) 5 MG tablet Take 1 tablet (5 mg total) by mouth 3 (three) times daily as needed for muscle spasms. 21 tablet 0 Past Month at Unknown time  . ezetimibe (ZETIA) 10 MG tablet Take 1 tablet (10 mg total) by mouth daily. 30 tablet 3 Past Month at Unknown time  . fenofibrate micronized (ANTARA) 130  MG capsule Take 1 capsule (130 mg total) by mouth daily. 30 capsule 3 10/16/2017 at Unknown time  . FLUoxetine (PROZAC) 20 MG capsule TAKE 1 CAPSULE BY MOUTH ONCE DAILY (Patient taking differently: TAKE 1 CAPSULE BY MOUTH ONCE DAILY--- TAKES IN AM) 90 capsule 0 10/16/2017 at Unknown time  . fluticasone (FLONASE) 50 MCG/ACT nasal spray Place 2 sprays into both nostrils every morning. (Patient taking differently: Place 2 sprays into both nostrils daily as needed. ) 16 g 6 Taking  . mupirocin ointment (BACTROBAN) 2 % APPLY TO NARES EVERY NIGHT AT BEDTIME AS NEEDED FOR LESIONS 22 g 1 Taking  . ranitidine (ZANTAC) 300 MG tablet TAKE 1 TABLET BY MOUTH ONCE DAILY AT BEDTIME.  NEEDS OFFICE VISIT FOR FURTHER REFILLS. 90 tablet 0 10/16/2017 at Unknown time  . diclofenac (VOLTAREN) 75 MG EC tablet TAKE 1 TABLET BY MOUTH TWICE DAILY 30 tablet 2 More than a month at Unknown time  . hydrocortisone (ANUSOL-HC) 2.5 % rectal cream Place 1 application rectally 2 (two) times daily. (Patient taking differently: Place 1 application rectally 2 (two) times daily as needed. ) 30 g 0 More than a month at Unknown time  . hydrocortisone (ANUSOL-HC) 25 MG suppository Use at bedtime x 1 week and as needed for bleeding. (Patient taking differently: 2 (two) times daily as needed. Use at bedtime x 1 week and as needed for bleeding.) 6 suppository 2 More than a month at Unknown time    Current Facility-Administered Medications  Medication Dose Route Frequency Provider Last Rate Last Dose  . bupivacaine liposome (EXPAREL) 1.3 % injection 266 mg  20 mL Infiltration Once Michael Boston, MD      . ceFAZolin (ANCEF) IVPB 2g/100  mL premix  2 g Intravenous On Call to OR Michael Boston, MD       And  . metroNIDAZOLE (FLAGYL) IVPB 500 mg  500 mg Intravenous On Call to OR Michael Boston, MD      . Chlorhexidine Gluconate Cloth 2 % PADS 6 each  6 each Topical Once Michael Boston, MD       And  . Chlorhexidine Gluconate Cloth 2 % PADS 6 each  6 each Topical Once Michael Boston, MD      . lactated ringers infusion   Intravenous Continuous Myrtie Soman, MD 50 mL/hr at 10/17/17 0741       Allergies  Allergen Reactions  . Crestor [Rosuvastatin] Other (See Comments)    myalgias    BP (!) 140/91   Pulse 67   Temp (!) 97.5 F (36.4 C) (Oral)   Resp 16   Ht 5\' 9"  (1.753 m)   Wt 70.8 kg (156 lb)   SpO2 99%   BMI 23.04 kg/m   Labs: No results found for this or any previous visit (from the past 48 hour(s)).  Imaging / Studies: No results found.   Adin Hector, M.D., F.A.C.S. Gastrointestinal and Minimally Invasive Surgery [facility] [address] [place], [place] [address] [phone] Main / Paging  10/17/2017 9:11 AM     Adin Hector

## 2017-10-17 NOTE — Telephone Encounter (Signed)
FYI

## 2017-10-17 NOTE — Discharge Instructions (Signed)
ANORECTAL SURGERY:  °POST OPERATIVE INSTRUCTIONS ° °###################################################################### ° °EAT °Start with a pureed / full liquid diet °After 24 hours, gradually transition to a high fiber diet.   ° °CONTROL PAIN °Control pain so you can tolerate bowel movements,  °walk, sleep, tolerate sneezing/coughing, and go up/down stairs. ° ° °HAVE A BOWEL MOVEMENT DAILY °Keep your bowels regular to avoid problems.   °Taking a fiber supplement every day to keep bowels soft.   °Try a laxative to override constipation. °Use an antidairrheal to slow down diarrhea.   °Call if not better after 2 tries ° °WALK °Walk an hour a day.  Control your pain to do that. °  °CALL IF YOU HAVE PROBLEMS/CONCERNS °Call if you are still struggling despite following these instructions. °Call if you have concerns not answered by these instructions ° °###################################################################### ° ° ° °1. Take your usually prescribed home medications unless otherwise directed. °2. DIET: Follow a light bland diet the first 24 hours after arrival home, such as soup, liquids, crackers, etc.  Be sure to include lots of fluids daily.  Avoid fast food or heavy meals as your are more likely to get nauseated.  Eat a low fat the next few days after surgery.   °3. PAIN CONTROL: °a. Pain is best controlled by a usual combination of three different methods TOGETHER: °i. Ice/Heat °ii. Over the counter pain medication °iii. Prescription pain medication °b. Expect swelling and discomfort in the anus/rectal area.  Warm water baths (30-60 minutes up to 6 times a day, especially after bowel meovements) will help. Use ice for the first few days to help decrease swelling and bruising, then switch to heat such as warm towels, sitz baths, warm baths, etc to help relax tight/sore spots and speed recovery.  Some people prefer to use ice alone, heat alone, alternating between ice & heat.  Experiment to what works  for you.   °c. It is helpful to take an over-the-counter pain medication continuously for the first few weeks.  Choose one of the following that works best for you: °i. Naproxen (Aleve, etc)  Two 220mg tabs twice a day °ii. Ibuprofen (Advil, etc) Three 200mg tabs four times a day (every meal & bedtime) °iii. Acetaminophen (Tylenol, etc) 500-650mg four times a day (every meal & bedtime) °d. A  prescription for pain medication (such as oxycodone, hydrocodone, etc) should be given to you upon discharge.  Take your pain medication as prescribed.  °i. If you are having problems/concerns with the prescription medicine (does not control pain, nausea, vomiting, rash, itching, etc), please call us [phone] to see if we need to switch you to a different pain medicine that will work better for you and/or control your side effect better. °ii. If you need a refill on your pain medication, please contact your pharmacy.  They will contact our office to request authorization. Prescriptions will not be filled after 5 pm or on week-ends.  If can take up to 48 hours for it to be filled & ready so avoid waiting until you are down to thel ast pill. °e. A topical cream (Dibucaine) or a prescription for a cream (such as diltiazem 2% gel) may be given to you.  Many people find relief with topical creams.  Some people find it burns too much.  Experiment.  If it helps, use it.  If it burns, don't using it. ° °Use a Sitz Bath 4-8 times a day for relief ° ° °Sitz Bath °A sitz bath   is a warm water bath taken in the sitting position that covers only the hips and buttocks. It may be used for either healing or hygiene purposes. Sitz baths are also used to relieve pain, itching, or muscle spasms. The water may contain medicine. Moist heat will help you heal and relax.  °HOME CARE INSTRUCTIONS  °Take 3 to 4 sitz baths a day. °1. Fill the bathtub half full with warm water. °2. Sit in the water and open the drain a little. °3. Turn on the warm  water to keep the tub half full. Keep the water running constantly. °4. Soak in the water for 15 to 20 minutes. °5. After the sitz bath, pat the affected area dry first. ° ° °4. KEEP YOUR BOWELS REGULAR °a. The goal is one soft bowel movement a day °b. Avoid getting constipated.  Between the surgery and the pain medications, it is common to experience some constipation.  Increasing fluid intake and taking a fiber supplement (such as Metamucil, Citrucel, FiberCon, MiraLax, etc) 2-3 times a day regularly will usually help prevent this problem from occurring.  A mild laxative (prune juice, Milk of Magnesia, MiraLax, etc) should be taken according to package directions if there are no bowel movements after 48 hours. °c. Watch out for diarrhea.  If you have many loose bowel movements, simplify your diet to bland foods & liquids for a few days.  Stop any stool softeners and decrease your fiber supplement.  Switching to mild anti-diarrheal medications (Kayopectate, Pepto Bismol) can help.  Can try an imodium/loperamide dose.  If this worsens or does not improve, please call us. ° °5. Wound Care ° °a. Remove your bandages with your first bowel movement, usually the day after surgery.  You may have packing if you had an abscess.  Let any packing or gauze fall come out.   °b. Wear an absorbent pad or soft cotton balls in your underwear as needed to catch any drainage and help keep the area  °c. Keep the area clean and dry.  Bathe / shower every day.  Keep the area clean by showering / bathing over the incision / wound.   It is okay to soak an open wound to help wash it.  Consider using a squeeze bottle filled with warm water to gently wash the anal area.  Wet wipes or showers / gentle washing after bowel movements is often less traumatic than regular toilet paper. °d. You will often notice bleeding with bowel movements.  This should slow down by the end of the first week of surgery.  Sitting on an ice pack can  help. °e. Expect some drainage.  This should slow down by the end of the first week of surgery, but you will have occasional bleeding or drainage up to a few months after surgery.  Wear an absorbent pad or soft cotton gauze in your underwear until the drainage stops. ° °6. ACTIVITIES as tolerated:   °a. You may resume regular (light) daily activities beginning the next day--such as daily self-care, walking, climbing stairs--gradually increasing activities as tolerated.  If you can walk 30 minutes without difficulty, it is safe to try more intense activity such as jogging, treadmill, bicycling, low-impact aerobics, swimming, etc. °b. Save the most intensive and strenuous activity for last such as sit-ups, heavy lifting, contact sports, etc  Refrain from any heavy lifting or straining until you are off narcotics for pain control.   °c. DO NOT PUSH THROUGH PAIN.  Let pain   be your guide: If it hurts to do something, don't do it.  Pain is your body warning you to avoid that activity for another week until the pain goes down. °d. You may drive when you are no longer taking prescription pain medication, you can comfortably sit for long periods of time, and you can safely maneuver your car and apply brakes. °e. You may have sexual intercourse when it is comfortable.  °7. FOLLOW UP in our office °a. Please call [facility] at [phone] to set up an appointment to see your surgeon in the office for a follow-up appointment approximately 2-3 weeks after your surgery. °b. Make sure that you call for this appointment the day you arrive home to ensure a convenient appointment time. ° °8. IF YOU HAVE DISABILITY OR FAMILY LEAVE FORMS, BRING THEM TO THE OFFICE FOR PROCESSING.  DO NOT GIVE THEM TO YOUR DOCTOR. ° ° ° ° ° ° ° °WHEN TO CALL US [phone]: °1. Poor pain control °2. Reactions / problems with new medications (rash/itching, nausea, etc)  °3. Fever over 101.5 F (38.5 C) °4. Inability to urinate °5. Nausea and/or  vomiting °6. Worsening swelling or bruising °7. Continued bleeding from incision. °8. Increased pain, redness, or drainage from the incision ° °The clinic staff is available to answer your questions during regular business hours (8:30am-5pm).  Please don’t hesitate to call and ask to speak to one of our nurses for clinical concerns.   A surgeon from [facility] is always on call at the hospitals °  °If you have a medical emergency, go to the nearest emergency room or call 911. °  ° °[facility] °[address], [address], [place], [place]  [address] ? °MAIN: [phone] ? TOLL FREE: [phone] ? °FAX [phone] °[email or web address] ° ° °HEMORRHOIDS  °The rectum is the last foot of your colon, and it naturally stretches to hold stool.  Hemorrhoidal piles are natural clusters of blood vessels that help the rectum and anal canal stretch to hold stool and allow bowel movements to eliminate feces.   °Hemorrhoids are abnormally swollen blood vessels in the rectum.  Too much pressure in the rectum causes hemorrhoids by forcing blood to stretch and bulge the walls of the veins, sometimes even rupturing them.  Hemorrhoids can become like varicose veins you might see on a person's legs.  °Most people will develop a flare of hemorrhoids in their lifetime.  When bulging hemorrhoidal veins are irritated, they can swell, burn, itch, cause pain, and bleed.  Most flares will calm down gradually own within a few weeks.  However, once hemorrhoids are created, they are difficult to get rid of completely and tend to flare more easily than the first flare.   Fortunately, good habits and simple medical treatment usually control hemorrhoids well, and surgery is needed only in severe cases. °Types of Hemorrhoids:  °Internal hemorrhoids usually don't initially hurt or itch; they are deep inside the rectum and usually have no sensation. If they begin to push out (prolapse), pain and  burning can occur.  However, internal hemorrhoids can bleed.  Anal bleeding should not be ignored since bleeding could come from a dangerous source like colorectal cancer, so persistent rectal bleeding should be investigated by a doctor, sometimes with a colonoscopy.  °External hemorrhoids cause most of the symptoms - pain, burning, and itching. Nonirritated hemorrhoids can look like small skin tags coming out of the anus.   °Thrombosed hemorrhoids can form   when a hemorrhoid blood vessel bursts and causes the hemorrhoid to suddenly swell.  A purple blood clot can form in it and become an excruciatingly painful lump at the anus. Because of these unpleasant symptoms, immediate incision and drainage by a surgeon at an office visit can provide much relief of the pain.    PREVENTION Avoiding the most frequent causes listed below will prevent most cases of hemorrhoids: Constipation Hard stools Diarrhea  Constant sitting  Straining with bowel movements Sitting on the toilet for a long time  Severe coughing  episodes Pregnancy / Childbirth  Heavy Lifting  Sometimes avoiding the above triggers is difficult:  How can you avoid sitting all day if you have a seated job? Also, we try to avoid coughing and diarrhea, but sometimes its beyond your control.  Still, there are some practical hints to help: Keep the anal and genital area clean.  Moistened tissues such as flushable wet wipes are less irritating than toilet paper.  Using irrigating showers or bottle irrigation washing gently cleans this sensitive area.   Avoid dry toilet paper when cleaning after bowel movements.  Marland Kitchen Keep the anal and genital area dry.  Lightly pat the rectal area dry.  Avoid rubbing.  Talcum or baby powders can help GET YOUR STOOLS SOFT.   This is the most important way to prevent irritated hemorrhoids.  Hard stools are like sandpaper to the anorectal canal and will cause more problems.  The goal: ONE SOFT BOWEL MOVEMENT A DAY!  BMs from  every other day to 3 times a day is a tolerable range Treat coughing, diarrhea and constipation early since irritated hemorrhoids may soon follow.  If your main job activity is seated, always stand or walk during your breaks. Make it a point to stand and walk at least 5 minutes every hour and try to shift frequently in your chair to avoid direct rectal pressure.  Always exhale as you strain or lift. Don't hold your breath.  Do not delay or try to prevent a bowel movement when the urge is present. Exercise regularly (walking or jogging 60 minutes a day) to stimulate the bowels to move. No reading or other activity while on the toilet. If bowel movements take longer than 5 minutes, you are too constipated. AVOID CONSTIPATION Drink plenty of liquids (1 1/2 to 2 quarts of water and other fluids a day unless fluid restricted for another medical condition). Liquids that contain caffeine (coffee a, tea, soft drinks) can be dehydrating and should be avoided until constipation is controlled. Consider minimizing milk, as dairy products may be constipating. Eat plenty of fiber (30g a day ideal, more if needed).  Fiber is the undigested part of plant food that passes into the colon, acting as natures broom to encourage bowel motility and movement.  Fiber can absorb and hold large amounts of water. This results in a larger, bulkier stool, which is soft and easier to pass.  Eating foods high in fiber - 12 servings - such as  Vegetables: Root (potatoes, carrots, turnips), Leafy green (lettuce, salad greens, celery, spinach), High residue (cabbage, broccoli, etc.) Fruit: Fresh, Dried (prunes, apricots, cherries), Stewed (applesauce)  Whole grain breads, pasta, whole wheat Bran cereals, muffins, etc. Consider adding supplemental bulking fiber which retains large volumes of water: Psyllium ground seeds (native plant from [place])--available as Metamucil, Konsyl, Effersyllium, Per Diem Fiber, or the less  expensive generic forms.  Citrucel  (methylcellulose wood fiber) . FiberCon (Polycarbophil) Polyethylene Glycol - and  artificial” fiber commonly called Miralax or Glycolax.  It is helpful for people with gassy or bloated feelings with regular fiber °Flax Seed - a less gassy natural fiber  °Laxatives can be useful for a short period if constipation is severe °Osmotics (Milk of Magnesia, Fleets Phospho-Soda, Magnesium Citrate)  °Stimulants (Senokot,   Castor Oil,  Dulcolax, Ex-Lax)    °Laxatives are not a good long-term solution as it can stress the bowels and cause too much mineral loss and dehydration.   Avoid taking laxatives for more than 7 days in a row. ° °AVOID DIARRHEA °Switch to liquids and simpler foods for a few days to avoid stressing your intestines further. °Avoid dairy products (especially milk & ice cream) for a short time.  The intestines often can lose the ability to digest lactose when stressed. °Avoid foods that cause gassiness or bloating.  Typical foods include beans and other legumes, cabbage, broccoli, and dairy foods.  Every person has some sensitivity to other foods, so listen to your body and avoid those foods that trigger problems for you. °Adding fiber (Citrucel, Metamucil, FiberCon, Flax seed, Miralax) gradually can help thicken stools by absorbing excess fluid and retrain the intestines to act more normally.  Slowly increase the dose over a few weeks.  Too much fiber too soon can backfire and cause cramping & bloating. °Probiotics (such as active yogurt, Align, etc) may help repopulate the intestines and colon with normal bacteria and calm down a sensitive digestive tract.  Most studies show it to be of mild help, though, and such products can be costly. °Medicines: °Bismuth subsalicylate (ex. Kayopectate, Pepto Bismol) every 30 minutes for up to 6 doses can help control diarrhea.  Avoid if pregnant. °Loperamide (Immodium) can slow down diarrhea.  Start with two tablets (4mg total)  first and then try one tablet every 6 hours.  Avoid if you are having fevers or severe pain.  If you are not better or start feeling worse, stop all medicines and call your doctor for advice °Call your doctor if you are getting worse or not better.  Sometimes further testing (cultures, endoscopy, X-ray studies, bloodwork, etc) may be needed to help diagnose and treat the cause of the diarrhea. ° °TROUBLESHOOTING IRREGULAR BOWELS °1) Avoid extremes of bowel movements (no bad constipation/diarrhea) °2) Miralax 17gm mixed in 8oz. water or juice-daily. May use BID as needed.  °3) Gas-x,Phazyme, etc. as needed for gas & bloating.  °4) Soft,bland diet. No spicy,greasy,fried foods.  °5) Prilosec over-the-counter as needed  °6) May hold gluten/wheat products from diet to see if symptoms improve.  °7)  May try probiotics (Align, Activa, etc) to help calm the bowels down °7) If symptoms become worse call back immediately. ° ° °TREATMENT OF HEMORRHOID FLARE °If these preventive measures fail, you must take action right away! Hemorrhoids are one condition that can be mild in the morning and become intolerable by nightfall. °Most hemorrhoidal flares take several weeks to calm down.  These suggestions can help: °Warm soaks.  This helps more than any topical medication.  Use up to 8 times a day.  Usually sitz baths or sitting in a warm bathtub helps.  Sitting on moist warm towels are helpful.  Switching to ice packs/cool compresses can be helpful ° °Use a Sitz Bath 4-8 times a day for relief °A sitz bath is a warm water bath taken in the sitting position that covers only the hips and buttocks. It may be used for either healing or hygiene   purposes. Sitz baths are also used to relieve pain, itching, or muscle spasms. The water may contain medicine. Moist heat will help you heal and relax.  HOME CARE INSTRUCTIONS  Take 3 to 4 sitz baths a day. 1. Fill the bathtub half full with warm water. 2. Sit in the water and open the drain a  little. 3. Turn on the warm water to keep the tub half full. Keep the water running constantly. 4. Soak in the water for 15 to 20 minutes. 5. After the sitz bath, pat the affected area dry first. SEEK MEDICAL CARE IF:  You get worse instead of better. Stop the sitz baths if you get worse.  Normalize your bowels.  Extremes of diarrhea or constipation will make hemorrhoids worse.  One soft bowel movement a day is the goal.  Fiber can help get your bowels regular Wet wipes instead of toilet paper Pain control with a NSAID such as ibuprofen (Advil) or naproxen (Aleve) or acetaminophen (Tylenol) around the clock.  Narcotics are constipating and should be minimized if possible Topical creams contain steroids (bydrocortisone) or local anesthetic (xylocaine) can help make pain and itching more tolerable.   EVALUATION If hemorrhoids are still causing problems, you could benefit by an evaluation by a surgeon.  The surgeon will obtain a history and examine you.  If hemorrhoids are diagnosed, some therapies can be offered in the office, usually with an anoscope into the less sensitive area of the rectum: -injection of hemorrhoids (sclerotherapy) can scar the blood vessels of the swollen/enlarged hemorrhoids to help shrink them down to a more normal size -rubber banding of the enlarged hemorrhoids to help shrink them down to a more normal size -drainage of the blood clot causing a thrombosed hemorrhoid,  to relieve the severe pain   While 90% of the time such problems from hemorrhoids can be managed without preceding to surgery, sometimes the hemorrhoids require a operation to control the problem (uncontrolled bleeding, prolapse, pain, etc.).   This involves being placed under general anesthesia where the surgeon can confirm the diagnosis and remove, suture, or staple the hemorrhoid(s).  Your surgeon can help you treat the problem appropriately.      Post Anesthesia Home Care Instructions  Activity: Get  plenty of rest for the remainder of the day. A responsible individual must stay with you for 24 hours following the procedure.  For the next 24 hours, DO NOT: -Drive a car -Paediatric nurse -Drink alcoholic beverages -Take any medication unless instructed by your physician -Make any legal decisions or sign important papers.  Meals: Start with liquid foods such as gelatin or soup. Progress to regular foods as tolerated. Avoid greasy, spicy, heavy foods. If nausea and/or vomiting occur, drink only clear liquids until the nausea and/or vomiting subsides. Call your physician if vomiting continues.  Special Instructions/Symptoms: Your throat may feel dry or sore from the anesthesia or the breathing tube placed in your throat during surgery. If this causes discomfort, gargle with warm salt water. The discomfort should disappear within 24 hours.  If you had a scopolamine patch placed behind your ear for the management of post- operative nausea and/or vomiting:  1. The medication in the patch is effective for 72 hours, after which it should be removed.  Wrap patch in a tissue and discard in the trash. Wash hands thoroughly with soap and water. 2. You may remove the patch earlier than 72 hours if you experience unpleasant side effects which may include dry mouth,  dizziness or visual disturbances. 3. Avoid touching the patch. Wash your hands with soap and water after contact with the patch.   Information for Discharge Teaching: EXPAREL (bupivacaine liposome injectable suspension)   Your surgeon gave you EXPAREL(bupivacaine) in your surgical incision to help control your pain after surgery.   EXPAREL is a local anesthetic that provides pain relief by numbing the tissue around the surgical site.  EXPAREL is designed to release pain medication over time and can control pain for up to 72 hours.  Depending on how you respond to EXPAREL, you may require less pain medication during your  recovery.  Possible side effects:  Temporary loss of sensation or ability to move in the area where bupivacaine was injected.  Nausea, vomiting, constipation  Rarely, numbness and tingling in your mouth or lips, lightheadedness, or anxiety may occur.  Call your doctor right away if you think you may be experiencing any of these sensations, or if you have other questions regarding possible side effects.  Follow all other discharge instructions given to you by your surgeon or nurse. Eat a healthy diet and drink plenty of water or other fluids.  If you return to the hospital for any reason within 96 hours following the administration of EXPAREL, please inform your health care providers.

## 2017-10-17 NOTE — Anesthesia Procedure Notes (Signed)
Procedure Name: Intubation Date/Time: 10/17/2017 9:21 AM Performed by: Gwyndolyn Saxon, CRNA Pre-anesthesia Checklist: Patient identified, Emergency Drugs available, Suction available, Patient being monitored and Timeout performed Patient Re-evaluated:Patient Re-evaluated prior to induction Oxygen Delivery Method: Circle system utilized Preoxygenation: Pre-oxygenation with 100% oxygen Induction Type: IV induction Ventilation: Mask ventilation without difficulty and Oral airway inserted - appropriate to patient size Laryngoscope Size: Sabra Heck and 2 Grade View: Grade I Tube type: Oral Tube size: 7.0 mm Number of attempts: 1 Placement Confirmation: ETT inserted through vocal cords under direct vision,  positive ETCO2,  CO2 detector and breath sounds checked- equal and bilateral Secured at: 24 cm Tube secured with: Tape Dental Injury: Teeth and Oropharynx as per pre-operative assessment

## 2017-10-17 NOTE — Anesthesia Postprocedure Evaluation (Signed)
Anesthesia Post Note  Patient: FINAS DELONE  Procedure(s) Performed: EXAM UNDER ANESTHESIA WITH HEMORRHOIDECTOMY AND HEMORRHOIDAL LIGATION/PEXY TIMES TWO (N/A Rectum)     Patient location during evaluation: PACU Anesthesia Type: General Level of consciousness: awake and alert Pain management: pain level controlled Vital Signs Assessment: post-procedure vital signs reviewed and stable Respiratory status: spontaneous breathing, nonlabored ventilation, respiratory function stable and patient connected to nasal cannula oxygen Cardiovascular status: blood pressure returned to baseline and stable Postop Assessment: no apparent nausea or vomiting Anesthetic complications: no    Last Vitals:  Vitals:   10/17/17 1115 10/17/17 1130  BP: (!) 171/94 (!) 155/88  Pulse: 63 64  Resp: 19 18  Temp:    SpO2: 100% 100%    Last Pain:  Vitals:   10/17/17 1130  TempSrc:   PainSc: 8                  Sire Poet S

## 2017-10-17 NOTE — Anesthesia Preprocedure Evaluation (Addendum)
Anesthesia Evaluation  Patient identified by MRN, date of birth, ID band Patient awake    Reviewed: Allergy & Precautions, NPO status , Patient's Chart, lab work & pertinent test results  Airway Mallampati: II  TM Distance: >3 FB Neck ROM: Full    Dental  (+) Poor Dentition, Chipped, Dental Advisory Given   Pulmonary neg pulmonary ROS,    Pulmonary exam normal breath sounds clear to auscultation       Cardiovascular negative cardio ROS Normal cardiovascular exam Rhythm:Regular Rate:Normal     Neuro/Psych Anxiety Depression negative neurological ROS     GI/Hepatic Neg liver ROS, GERD  ,  Endo/Other  negative endocrine ROS  Renal/GU negative Renal ROS  negative genitourinary   Musculoskeletal negative musculoskeletal ROS (+)   Abdominal   Peds negative pediatric ROS (+)  Hematology negative hematology ROS (+)   Anesthesia Other Findings   Reproductive/Obstetrics negative OB ROS                            Anesthesia Physical Anesthesia Plan  ASA: II  Anesthesia Plan: General   Post-op Pain Management:    Induction: Intravenous  PONV Risk Score and Plan: Ondansetron, Dexamethasone and Treatment may vary due to age or medical condition  Airway Management Planned: Oral ETT  Additional Equipment:   Intra-op Plan:   Post-operative Plan: Extubation in OR  Informed Consent: I have reviewed the patients History and Physical, chart, labs and discussed the procedure including the risks, benefits and alternatives for the proposed anesthesia with the patient or authorized representative who has indicated his/her understanding and acceptance.   Dental advisory given  Plan Discussed with: CRNA and Surgeon  Anesthesia Plan Comments:         Anesthesia Quick Evaluation

## 2017-10-17 NOTE — Telephone Encounter (Signed)
Pt's wife Sharyn Lull called to inform that pt is having surgery today with Dr. Michael Boston.

## 2017-10-17 NOTE — Transfer of Care (Signed)
Immediate Anesthesia Transfer of Care Note  Patient: Aaron Dyer  Procedure(s) Performed: EXAM UNDER ANESTHESIA WITH HEMORRHOIDECTOMY AND HEMORRHOIDAL LIGATION/PEXY TIMES TWO (N/A Rectum)  Patient Location: PACU  Anesthesia Type:General  Level of Consciousness: drowsy  Airway & Oxygen Therapy: Patient Spontanous Breathing and Patient connected to nasal cannula oxygen  Post-op Assessment: Report given to RN and Post -op Vital signs reviewed and stable  Post vital signs: Reviewed and stable  Last Vitals:  Vitals Value Taken Time  BP 150/83 10/17/2017 10:39 AM  Temp    Pulse 70 10/17/2017 10:43 AM  Resp 20 10/17/2017 10:43 AM  SpO2 100 % 10/17/2017 10:43 AM  Vitals shown include unvalidated device data.  Last Pain:  Vitals:   10/17/17 0716  TempSrc: Oral         Complications: No apparent anesthesia complications

## 2017-10-17 NOTE — Op Note (Signed)
10/17/2017  10:24 AM  PATIENT:  Aaron Dyer  52 y.o. male  Patient Care Team: Mosie Lukes, MD as PCP - General (Family Medicine) Michael Boston, MD as Consulting Physician (General Surgery) Pyrtle, Lajuan Lines, MD as Consulting Physician (Gastroenterology)  PRE-OPERATIVE DIAGNOSIS:  HEMORRHOIDS PROLAPSED GRADE 4 WITH BLEEDING AND PAIN  POST-OPERATIVE DIAGNOSIS:  HEMORRHOIDS PROLAPSED GRADE 4 WITH BLEEDING AND PAIN  PROCEDURE:  Procedure(s): EXAM UNDER ANESTHESIA WITH HEMORRHOIDECTOMY AND HEMORRHOIDAL LIGATION/PEXY TIMES TWO   Internal and external hemorrhoidectomy  x2 Internal hemorrhoidal ligation and pexy Anorectal examination under anesthesia  SURGEON:  Adin Hector, MD  ANESTHESIA:   General Anorectal & Local field block (0.25% bupivacaine with epinephrine mixed with Liposomal bupivacaine (Experel)   EBL:  Total I/O In: 1000 [I.V.:1000] Out: 5 [Blood:5].  See operative record  Delay start of Pharmacological VTE agent (>24hrs) due to surgical blood loss or risk of bleeding:  NO  DRAINS: NONE  SPECIMEN:   Internal & external hemorrhoidx2  DISPOSITION OF SPECIMEN:  PATHOLOGY  COUNTS:  YES  PLAN OF CARE: Discharge home after PACU  PATIENT DISPOSITION:  PACU - hemodynamically stable.  INDICATION: Pleasant patient with struggles with hemorrhoids.  Not able to be managed in the office despite an improved bowel regimen.  I recommended examination under anesthesia and surgical treatment:  The anatomy & physiology of the anorectal region was discussed.  The pathophysiology of hemorrhoids and differential diagnosis was discussed.  Natural history risks without surgery was discussed.   I stressed the importance of a bowel regimen to have daily soft bowel movements to minimize progression of disease.  Interventions such as sclerotherapy & banding were discussed.  The patient's symptoms are not adequately controlled by medicines and other non-operative treatments.  I feel  the risks & problems of no surgery outweigh the operative risks; therefore, I recommended surgery to treat the hemorrhoids by ligation, pexy, and possible resection.  Risks such as bleeding, infection, need for further treatment, heart attack, death, and other risks were discussed.   I noted a good likelihood this will help address the problem.  Goals of post-operative recovery were discussed as well.  Possibility that this will not correct all symptoms was explained.  Post-operative pain, bleeding, constipation, urinary difficulties, and other problems after surgery were discussed.  We will work to minimize complications.   Educational handouts further explaining the pathology, treatment options, and bowel regimen were given as well.  Questions were answered.  The patient expresses understanding & wishes to proceed with surgery.  OR FINDINGS: Grade 4 right posterior greater than right anterior internal hemorrhoids.  Grade 3 left lateral internal hemorrhoid.  Resection done of right piles along with pexy.  Ligation/pexy done of the left pile  DESCRIPTION:   Informed consent was confirmed. Patient underwent general anesthesia without difficulty. Patient was placed into prone positioning.  The perianal region was prepped and draped in sterile fashion. Surgical time-out confirmed our plan.  I did digital rectal examination and then transitioned over to anoscopy to get a sense of the anatomy.  Findings noted above.   I proceeded to do hemorrhoidal ligation and pexy.  I used a 2-0 Vicryl suture on a UR-6 needle in a figure-of-eight fashion 6 cm proximal to the anal verge.  I started at the largest hemorrhoid pile.  Because of redundant hemorrhoidal tissue too bulky to merely ligate or pexy, I excised the excess internal hemorrhoid piles longitudinally in a fusiform biconcave fashion, at the  right anterior  and right posterior location, sparing the anal canal to avoid narrowing.  I then ran that stitch  longitudinally more distally to close the hemorrhoidectomy wound to the anal verge over a Parks self retaining retractor & occasionally a large Hill-Furgeson retractor to avoid narrowing of the anal canal.  I then tied that stitch down to cause a hemorrhoidopexy.   I then did hemorrhoidal ligation and pexy at the other 4 columns.  At the completion of this, all 6 anorectal columns were ligated and pexied in the classic hexagonal fashion (right anterior/lateral/posterior, left anterior/lateral/posterior).  I closed the external part of the hemorrhoidectomy wounds with interrupted horizontal mattress 2-0 chromic suture, leaving the last 5 mm open to allow natural drainage.    I redid anoscopy & examination.  At completion of this, all hemorrhoids had been removed or reduced into the rectum.  There is no more prolapse.  Internal & external anatomy was more more normal.  Hemostasis was good.  Fluffed gauze was on-laid over the perianal region.  No packing done.  Patient is being extubated go to go to the recovery room.  I had discussed postop care in detail with the patient in the preop holding area.  Instructions for post-operative recovery and prescriptions are written. I discussed operative findings, updated the patient's status, discussed probable steps to recovery, and gave postoperative recommendations to the patient's family.  Recommendations were made.  Questions were answered.  They expressed understanding & appreciation.  Adin Hector, M.D., F.A.C.S. Gastrointestinal and Minimally Invasive Surgery [facility] [address], [place], [place] [address] [phone] Main / Paging

## 2017-10-20 ENCOUNTER — Encounter (HOSPITAL_BASED_OUTPATIENT_CLINIC_OR_DEPARTMENT_OTHER): Payer: Self-pay | Admitting: Surgery

## 2017-10-20 ENCOUNTER — Encounter: Payer: Medicare Other | Admitting: Internal Medicine

## 2017-11-22 ENCOUNTER — Other Ambulatory Visit: Payer: Self-pay | Admitting: Family Medicine

## 2017-11-22 DIAGNOSIS — M545 Low back pain, unspecified: Secondary | ICD-10-CM

## 2017-11-22 DIAGNOSIS — S0101XA Laceration without foreign body of scalp, initial encounter: Secondary | ICD-10-CM

## 2017-11-22 DIAGNOSIS — D72829 Elevated white blood cell count, unspecified: Secondary | ICD-10-CM

## 2017-11-22 DIAGNOSIS — W57XXXA Bitten or stung by nonvenomous insect and other nonvenomous arthropods, initial encounter: Secondary | ICD-10-CM

## 2017-11-22 DIAGNOSIS — K649 Unspecified hemorrhoids: Secondary | ICD-10-CM

## 2017-11-22 DIAGNOSIS — E785 Hyperlipidemia, unspecified: Secondary | ICD-10-CM

## 2017-11-22 DIAGNOSIS — F418 Other specified anxiety disorders: Secondary | ICD-10-CM

## 2017-11-22 DIAGNOSIS — F32A Depression, unspecified: Secondary | ICD-10-CM

## 2017-11-22 DIAGNOSIS — F329 Major depressive disorder, single episode, unspecified: Secondary | ICD-10-CM

## 2017-11-25 IMAGING — DX DG LUMBAR SPINE COMPLETE 4+V
6 series · 6 of 6 positions shown · non-contrast
Comparison: Lumbar spine films of 10/13/2014

CLINICAL DATA: Back pain for several years, increasing recently,
some tingling and numbness in the lower extremities, no injury

EXAM:
LUMBAR SPINE - COMPLETE 4+ VIEW

[l-spine ap]
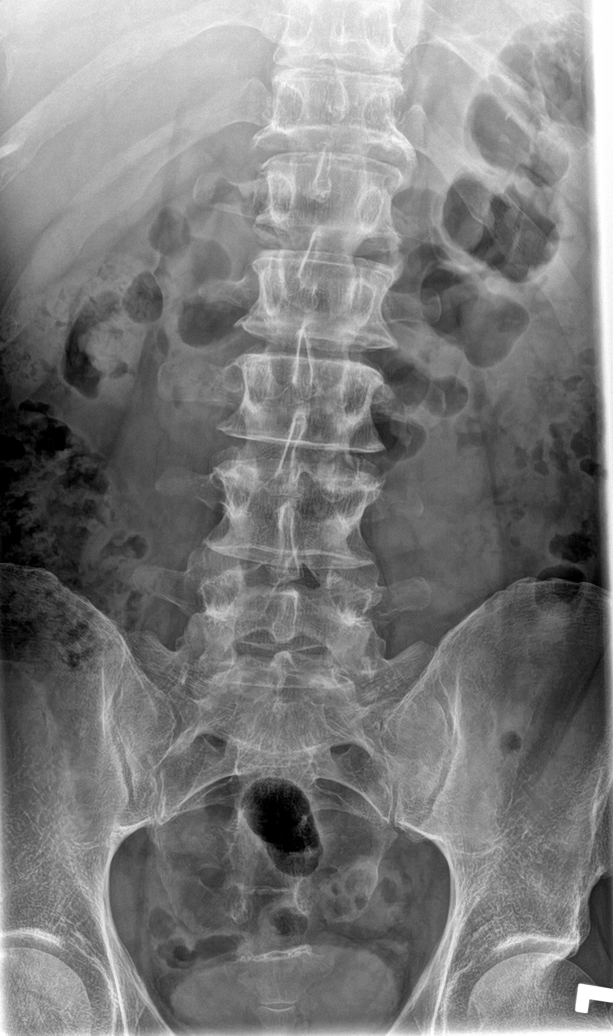

[l-spine obl (1 of 3)]
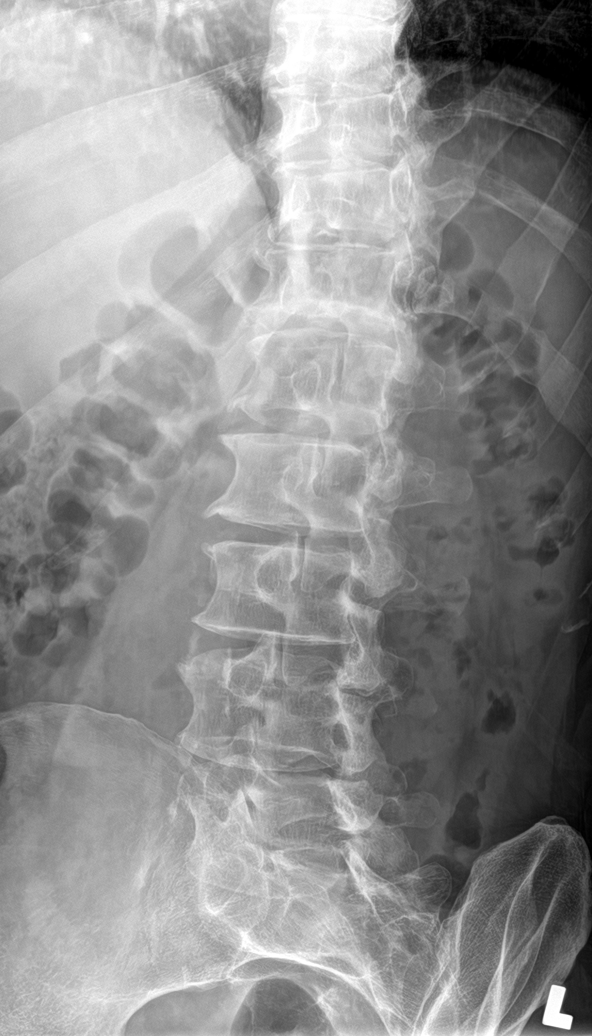

[l-spine obl (2 of 3)]
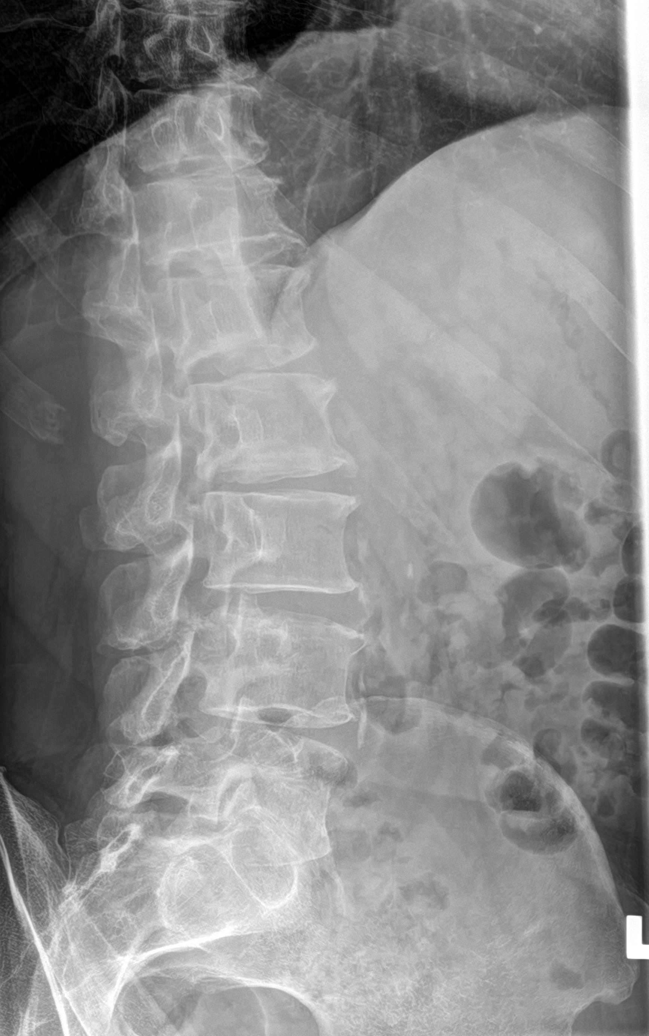

[l-spine lat]
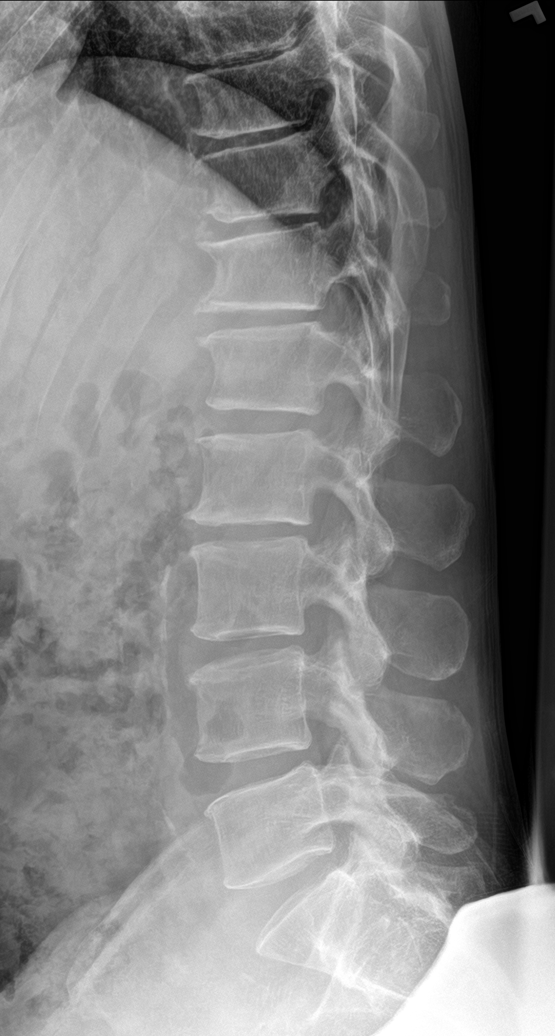

[l-spine spot]
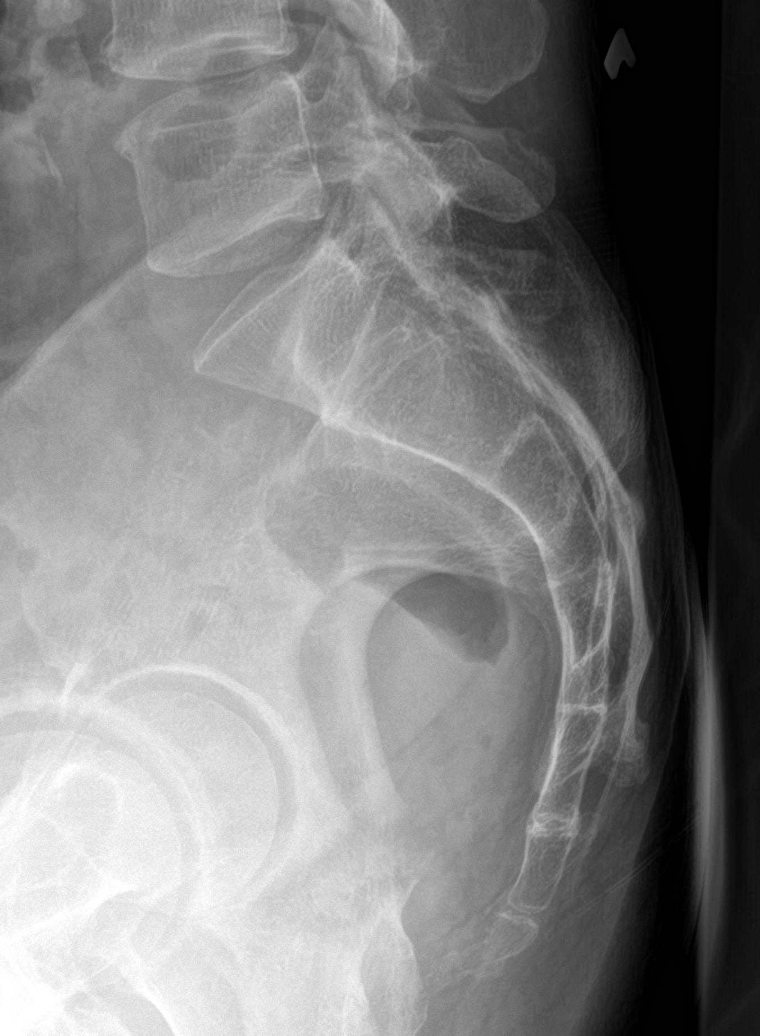

[l-spine obl (3 of 3)]
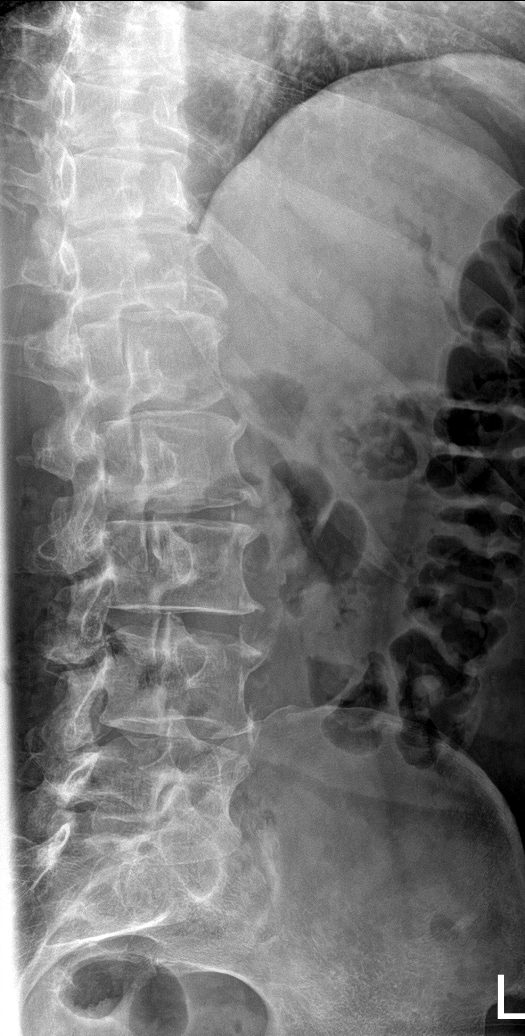

[6 of 6 positions shown; findings below may reference images not displayed]

FINDINGS: The lumbar vertebrae are slightly straightened in alignment.
Intervertebral disc spaces appear normal with only mild degenerative
disc disease at L2-3. At L2-3 there is slight loss of disc space
with sclerosis and spurring present which has progressed since the
images from 1025. No compression deformity is seen. There is
degenerative change in the lower thoracic spine as well. The SI
joints appear corticated. The bowel gas pattern is nonspecific.
IMPRESSION: Slightly straightened alignment with degenerative disc disease at
L2-3.

## 2017-12-27 ENCOUNTER — Other Ambulatory Visit: Payer: Self-pay | Admitting: Family Medicine

## 2017-12-27 DIAGNOSIS — D72829 Elevated white blood cell count, unspecified: Secondary | ICD-10-CM

## 2017-12-27 DIAGNOSIS — M545 Low back pain, unspecified: Secondary | ICD-10-CM

## 2017-12-27 DIAGNOSIS — F32A Depression, unspecified: Secondary | ICD-10-CM

## 2017-12-27 DIAGNOSIS — W57XXXA Bitten or stung by nonvenomous insect and other nonvenomous arthropods, initial encounter: Secondary | ICD-10-CM

## 2017-12-27 DIAGNOSIS — F329 Major depressive disorder, single episode, unspecified: Secondary | ICD-10-CM

## 2017-12-27 DIAGNOSIS — F418 Other specified anxiety disorders: Secondary | ICD-10-CM

## 2017-12-27 DIAGNOSIS — E785 Hyperlipidemia, unspecified: Secondary | ICD-10-CM

## 2017-12-27 DIAGNOSIS — S0101XA Laceration without foreign body of scalp, initial encounter: Secondary | ICD-10-CM

## 2017-12-27 DIAGNOSIS — K649 Unspecified hemorrhoids: Secondary | ICD-10-CM

## 2018-01-28 ENCOUNTER — Telehealth: Payer: Self-pay

## 2018-01-28 NOTE — Telephone Encounter (Signed)
Copied from [place] [id number]. Topic: General - Inquiry >> Jan 26, 2018 11:24 AM Conception Chancy, NT wrote: Reason for CRM: patient wife is calling and states that patient is assigned [employer] Duty the last week in November and due to his health conditions would like a letter to be exempt. Please contact.      Dr. Charlett Blake- Please advise on which conditions and I will write letter

## 2018-01-28 NOTE — Telephone Encounter (Signed)
He can have a letter excusing him from jury duty for anxiety disorder and numerous medical comirbidities

## 2018-01-29 NOTE — Telephone Encounter (Signed)
Jury duty letter sent to [employer].

## 2018-02-17 ENCOUNTER — Telehealth: Payer: Self-pay | Admitting: Family Medicine

## 2018-02-17 NOTE — Telephone Encounter (Signed)
Patients wife Jedrick Hutcherson 10/15/[birth year], requested patient have a letter excusing him form jury duty. See in [facility] chart Patients Message 02/12/18. Letter printed and signed by pcp. Patient notified

## 2018-03-19 ENCOUNTER — Other Ambulatory Visit: Payer: Self-pay | Admitting: Family Medicine

## 2018-03-19 DIAGNOSIS — M545 Low back pain, unspecified: Secondary | ICD-10-CM

## 2018-03-19 DIAGNOSIS — K649 Unspecified hemorrhoids: Secondary | ICD-10-CM

## 2018-03-19 DIAGNOSIS — W57XXXA Bitten or stung by nonvenomous insect and other nonvenomous arthropods, initial encounter: Secondary | ICD-10-CM

## 2018-03-19 DIAGNOSIS — F418 Other specified anxiety disorders: Secondary | ICD-10-CM

## 2018-03-19 DIAGNOSIS — E785 Hyperlipidemia, unspecified: Secondary | ICD-10-CM

## 2018-03-19 DIAGNOSIS — F32A Depression, unspecified: Secondary | ICD-10-CM

## 2018-03-19 DIAGNOSIS — S0101XA Laceration without foreign body of scalp, initial encounter: Secondary | ICD-10-CM

## 2018-03-19 DIAGNOSIS — D72829 Elevated white blood cell count, unspecified: Secondary | ICD-10-CM

## 2018-03-19 DIAGNOSIS — F329 Major depressive disorder, single episode, unspecified: Secondary | ICD-10-CM

## 2018-03-19 NOTE — Telephone Encounter (Signed)
Request for wellbutrin left pended for Dr. Frederik Pear review, as last ordered 08/29/2015. LOV 06/03/17.

## 2018-03-26 DIAGNOSIS — L578 Other skin changes due to chronic exposure to nonionizing radiation: Secondary | ICD-10-CM | POA: Diagnosis not present

## 2018-03-26 DIAGNOSIS — L814 Other melanin hyperpigmentation: Secondary | ICD-10-CM | POA: Diagnosis not present

## 2018-03-26 DIAGNOSIS — C44519 Basal cell carcinoma of skin of other part of trunk: Secondary | ICD-10-CM | POA: Diagnosis not present

## 2018-03-26 DIAGNOSIS — D229 Melanocytic nevi, unspecified: Secondary | ICD-10-CM | POA: Diagnosis not present

## 2018-03-26 DIAGNOSIS — Z85820 Personal history of malignant melanoma of skin: Secondary | ICD-10-CM | POA: Diagnosis not present

## 2018-03-26 DIAGNOSIS — L821 Other seborrheic keratosis: Secondary | ICD-10-CM | POA: Diagnosis not present

## 2018-03-26 DIAGNOSIS — D485 Neoplasm of uncertain behavior of skin: Secondary | ICD-10-CM | POA: Diagnosis not present

## 2018-03-26 DIAGNOSIS — C44629 Squamous cell carcinoma of skin of left upper limb, including shoulder: Secondary | ICD-10-CM | POA: Diagnosis not present

## 2018-03-26 DIAGNOSIS — L570 Actinic keratosis: Secondary | ICD-10-CM | POA: Diagnosis not present

## 2018-03-26 DIAGNOSIS — Z85828 Personal history of other malignant neoplasm of skin: Secondary | ICD-10-CM | POA: Diagnosis not present

## 2018-03-26 DIAGNOSIS — L812 Freckles: Secondary | ICD-10-CM | POA: Diagnosis not present

## 2018-03-26 DIAGNOSIS — D1801 Hemangioma of skin and subcutaneous tissue: Secondary | ICD-10-CM | POA: Diagnosis not present

## 2018-03-26 DIAGNOSIS — I781 Nevus, non-neoplastic: Secondary | ICD-10-CM | POA: Diagnosis not present

## 2018-03-26 DIAGNOSIS — Z1283 Encounter for screening for malignant neoplasm of skin: Secondary | ICD-10-CM | POA: Diagnosis not present

## 2018-04-07 ENCOUNTER — Ambulatory Visit (HOSPITAL_BASED_OUTPATIENT_CLINIC_OR_DEPARTMENT_OTHER)
Admission: RE | Admit: 2018-04-07 | Discharge: 2018-04-07 | Disposition: A | Discharge status: Home / Self Care | Payer: BLUE CROSS/BLUE SHIELD | Source: Ambulatory Visit | Attending: Family Medicine | Admitting: Family Medicine

## 2018-04-07 ENCOUNTER — Ambulatory Visit (INDEPENDENT_AMBULATORY_CARE_PROVIDER_SITE_OTHER): Payer: BLUE CROSS/BLUE SHIELD | Admitting: Family Medicine

## 2018-04-07 VITALS — BP 112/78 | HR 76 | Temp 97.9°F | Resp 18 | Wt 166.4 lb

## 2018-04-07 DIAGNOSIS — M25512 Pain in left shoulder: Secondary | ICD-10-CM | POA: Diagnosis not present

## 2018-04-07 DIAGNOSIS — F329 Major depressive disorder, single episode, unspecified: Secondary | ICD-10-CM

## 2018-04-07 DIAGNOSIS — D72829 Elevated white blood cell count, unspecified: Secondary | ICD-10-CM | POA: Diagnosis not present

## 2018-04-07 DIAGNOSIS — T7840XS Allergy, unspecified, sequela: Secondary | ICD-10-CM | POA: Diagnosis not present

## 2018-04-07 DIAGNOSIS — G4700 Insomnia, unspecified: Secondary | ICD-10-CM

## 2018-04-07 DIAGNOSIS — D649 Anemia, unspecified: Secondary | ICD-10-CM

## 2018-04-07 DIAGNOSIS — C4490 Unspecified malignant neoplasm of skin, unspecified: Secondary | ICD-10-CM

## 2018-04-07 DIAGNOSIS — K649 Unspecified hemorrhoids: Secondary | ICD-10-CM

## 2018-04-07 DIAGNOSIS — G8929 Other chronic pain: Secondary | ICD-10-CM | POA: Diagnosis not present

## 2018-04-07 DIAGNOSIS — F32A Depression, unspecified: Secondary | ICD-10-CM

## 2018-04-07 DIAGNOSIS — S0101XA Laceration without foreign body of scalp, initial encounter: Secondary | ICD-10-CM

## 2018-04-07 DIAGNOSIS — M545 Low back pain, unspecified: Secondary | ICD-10-CM

## 2018-04-07 DIAGNOSIS — F418 Other specified anxiety disorders: Secondary | ICD-10-CM

## 2018-04-07 DIAGNOSIS — R109 Unspecified abdominal pain: Secondary | ICD-10-CM

## 2018-04-07 DIAGNOSIS — K921 Melena: Secondary | ICD-10-CM

## 2018-04-07 DIAGNOSIS — J209 Acute bronchitis, unspecified: Secondary | ICD-10-CM

## 2018-04-07 DIAGNOSIS — R079 Chest pain, unspecified: Secondary | ICD-10-CM

## 2018-04-07 DIAGNOSIS — R0609 Other forms of dyspnea: Secondary | ICD-10-CM

## 2018-04-07 DIAGNOSIS — E785 Hyperlipidemia, unspecified: Secondary | ICD-10-CM | POA: Diagnosis not present

## 2018-04-07 DIAGNOSIS — W57XXXA Bitten or stung by nonvenomous insect and other nonvenomous arthropods, initial encounter: Secondary | ICD-10-CM

## 2018-04-07 DIAGNOSIS — F419 Anxiety disorder, unspecified: Secondary | ICD-10-CM

## 2018-04-07 MED ORDER — EZETIMIBE 10 MG PO TABS: 10.0000 mg | ORAL_TABLET | Freq: Every day | ORAL | 3 refills | Status: DC

## 2018-04-07 MED ORDER — CIPROFLOXACIN HCL 500 MG PO TABS: 500.0000 mg | ORAL_TABLET | Freq: Two times a day (BID) | ORAL | 0 refills | Status: DC

## 2018-04-07 MED ORDER — FAMOTIDINE 40 MG PO TABS: 40.0000 mg | ORAL_TABLET | Freq: Every day | ORAL | 1 refills | Status: DC

## 2018-04-07 MED ORDER — BUPROPION HCL ER (XL) 300 MG PO TB24: 300.0000 mg | ORAL_TABLET | Freq: Every day | ORAL | 1 refills | Status: DC

## 2018-04-07 MED ORDER — FLUTICASONE PROPIONATE 50 MCG/ACT NA SUSP: 2.0000 | Freq: Every day | NASAL | 3 refills | Status: DC | PRN

## 2018-04-07 MED ORDER — FLUOXETINE HCL 20 MG PO CAPS: 20.0000 mg | ORAL_CAPSULE | Freq: Every day | ORAL | 1 refills | Status: DC

## 2018-04-07 MED ORDER — CYCLOBENZAPRINE HCL 5 MG PO TABS: 5.0000 mg | ORAL_TABLET | Freq: Three times a day (TID) | ORAL | 0 refills | Status: DC | PRN

## 2018-04-07 MED ORDER — FENOFIBRATE MICRONIZED 130 MG PO CAPS: 130.0000 mg | ORAL_CAPSULE | Freq: Every day | ORAL | 3 refills | Status: DC

## 2018-04-07 MED ORDER — GUAIFENESIN ER 600 MG PO TB12: 600.0000 mg | ORAL_TABLET | Freq: Two times a day (BID) | ORAL | 0 refills | Status: DC | PRN

## 2018-04-07 MED ORDER — DICLOFENAC SODIUM 75 MG PO TBEC: 75.0000 mg | DELAYED_RELEASE_TABLET | Freq: Two times a day (BID) | ORAL | 2 refills | Status: DC

## 2018-04-07 MED ORDER — CETIRIZINE HCL 10 MG PO TABS: 10.0000 mg | ORAL_TABLET | Freq: Every evening | ORAL | 11 refills | Status: DC | PRN

## 2018-04-07 NOTE — Patient Instructions (Signed)
Shingrix is the new shingles vaccine, 2 shots over 2-48mo period. You can get it at the pharmacy.  Shoulder Pain Many things can cause shoulder pain, including:  An injury to the shoulder.  Overuse of the shoulder.  Arthritis. The source of the pain can be:  Inflammation.  An injury to the shoulder joint.  An injury to a tendon, ligament, or bone. Follow these instructions at home: Pay attention to changes in your symptoms. Let your health care provider know about them. Follow these instructions to relieve your pain. If you have a sling:  Wear the sling as told by your health care provider. Remove it only as told by your health care provider.  Loosen the sling if your fingers tingle, become numb, or turn cold and blue.  Keep the sling clean.  If the sling is not waterproof: ? Do not let it get wet. Remove it to shower or bathe.  Move your arm as little as possible, but keep your hand moving to prevent swelling. Managing pain, stiffness, and swelling   If directed, put ice on the painful area: ? Put ice in a plastic bag. ? Place a towel between your skin and the bag. ? Leave the ice on for 20 minutes, 2-3 times per day. Stop applying ice if it does not help with the pain.  Squeeze a soft ball or a foam pad as much as possible. This helps to keep the shoulder from swelling. It also helps to strengthen the arm. General instructions  Take over-the-counter and prescription medicines only as told by your health care provider.  Keep all follow-up visits as told by your health care provider. This is important. Contact a health care provider if:  Your pain gets worse.  Your pain is not relieved with medicines.  New pain develops in your arm, hand, or fingers. Get help right away if:  Your arm, hand, or fingers: ? Tingle. ? Become numb. ? Become swollen. ? Become painful. ? Turn white or blue. Summary  Shoulder pain can be caused by an injury, overuse, or  arthritis.  Pay attention to changes in your symptoms. Let your health care provider know about them.  This condition may be treated with a sling, ice, and pain medicines.  Contact your health care provider if the pain gets worse or new pain develops. Get help right away if your arm, hand, or fingers tingle or become numb, swollen, or painful.  Keep all follow-up visits as told by your health care provider. This is important. This information is not intended to replace advice given to you by your health care provider. Make sure you discuss any questions you have with your health care provider. Document Released: 12/19/2004 Document Revised: 09/23/2017 Document Reviewed: 09/23/2017 Elsevier Interactive Patient Education  2019 [employer].

## 2018-04-08 LAB — TSH: TSH: 3.59 u[IU]/mL (ref 0.35–4.50)

## 2018-04-08 LAB — COMPREHENSIVE METABOLIC PANEL
ALT: 17 U/L (ref 0–53)
AST: 14 U/L (ref 0–37)
Albumin: 4.8 g/dL (ref 3.5–5.2)
Alkaline Phosphatase: 79 U/L (ref 39–117)
BILIRUBIN TOTAL: 0.4 mg/dL (ref 0.2–1.2)
BUN: 15 mg/dL (ref 6–23)
CALCIUM: 10.2 mg/dL (ref 8.4–10.5)
CHLORIDE: 100 meq/L (ref 96–112)
CO2: 32 meq/L (ref 19–32)
Creatinine, Ser: 1.16 mg/dL (ref 0.40–1.50)
GFR: 69.94 mL/min (ref >60.00)
Glucose, Bld: 89 mg/dL (ref 70–99)
Potassium: 5.7 mEq/L — ABNORMAL HIGH (ref 3.5–5.1)
Sodium: 137 mEq/L (ref 135–145)
Total Protein: 7.5 g/dL (ref 6.0–8.3)

## 2018-04-08 LAB — LIPID PANEL
CHOLESTEROL: 255 mg/dL — AB (ref 0–200)
HDL: 35.6 mg/dL — ABNORMAL LOW (ref >39.00)
LDL CALC: 182 mg/dL — AB (ref 0–99)
NonHDL: 219.16
TRIGLYCERIDES: 187 mg/dL — AB (ref 0.0–149.0)
Total CHOL/HDL Ratio: 7
VLDL: 37.4 mg/dL (ref 0.0–40.0)

## 2018-04-09 ENCOUNTER — Telehealth: Payer: Self-pay | Admitting: Family Medicine

## 2018-04-09 LAB — MANUAL DIFFERENTIAL
Absolute Monocytes: 806 cells/uL (ref 200–950)
BASOS ABS: 104 {cells}/uL (ref 0–200)
Basophils Relative: 0.8 %
Eosinophils Absolute: 143 cells/uL (ref 15–500)
Eosinophils Relative: 1.1 %
HEMATOCRIT: 47.6 % (ref 38.5–50.0)
HEMOGLOBIN: 15.7 g/dL (ref 13.2–17.1)
LYMPHS ABS: 4420 {cells}/uL — AB (ref 850–3900)
MCH: 31.5 pg (ref 27.0–33.0)
MCHC: 33 g/dL (ref 32.0–36.0)
MCV: 95.6 fL (ref 80.0–100.0)
MONOS PCT: 6.2 %
MPV: 10.4 fL (ref 7.5–12.5)
NEUTROS PCT: 57.9 %
Neutro Abs: 7527 cells/uL (ref 1500–7800)
PLATELETS: 342 10*3/uL (ref 140–400)
RBC: 4.98 10*6/uL (ref 4.20–5.80)
RDW: 13.1 % (ref 11.0–15.0)
Total Lymphocyte: 34 %
WBC: 13 10*3/uL — AB (ref 3.8–10.8)

## 2018-04-09 NOTE — Telephone Encounter (Signed)
Copied from [place] [phone]. Topic: Quick Communication - See Telephone Encounter >> Apr 09, 2018  4:05 PM Blase Mess A wrote: CRM for notification. See Telephone encounter for: 04/09/18.  Patient is calling for lab results. NOD was not available. The Patient said ok to call back. [phone] (M) Thank you.

## 2018-04-09 NOTE — Telephone Encounter (Signed)
Charted in result notes. 

## 2018-04-10 ENCOUNTER — Telehealth: Payer: Self-pay

## 2018-04-10 ENCOUNTER — Encounter: Payer: Self-pay | Admitting: Family Medicine

## 2018-04-10 NOTE — Telephone Encounter (Signed)
Copied from [place] [phone]. Topic: General - Other >> Apr 10, 2018  9:54 AM Carolyn Stare wrote:  Pt said he was left a message to call and schedule an [place] and is returning the call

## 2018-04-10 NOTE — Telephone Encounter (Signed)
Please see result note 04/07/18 Patient agrees to Ultrasound

## 2018-04-12 ENCOUNTER — Other Ambulatory Visit: Payer: Self-pay | Admitting: Family Medicine

## 2018-04-12 DIAGNOSIS — R109 Unspecified abdominal pain: Secondary | ICD-10-CM

## 2018-04-12 DIAGNOSIS — R160 Hepatomegaly, not elsewhere classified: Secondary | ICD-10-CM

## 2018-04-12 DIAGNOSIS — K5900 Constipation, unspecified: Secondary | ICD-10-CM

## 2018-04-12 NOTE — Telephone Encounter (Signed)
ordered

## 2018-04-13 ENCOUNTER — Other Ambulatory Visit (INDEPENDENT_AMBULATORY_CARE_PROVIDER_SITE_OTHER): Payer: BLUE CROSS/BLUE SHIELD

## 2018-04-13 DIAGNOSIS — E875 Hyperkalemia: Secondary | ICD-10-CM

## 2018-04-13 DIAGNOSIS — D72820 Lymphocytosis (symptomatic): Secondary | ICD-10-CM

## 2018-04-13 DIAGNOSIS — D72829 Elevated white blood cell count, unspecified: Secondary | ICD-10-CM | POA: Diagnosis not present

## 2018-04-13 DIAGNOSIS — R0609 Other forms of dyspnea: Secondary | ICD-10-CM | POA: Insufficient documentation

## 2018-04-13 DIAGNOSIS — R109 Unspecified abdominal pain: Secondary | ICD-10-CM | POA: Insufficient documentation

## 2018-04-13 DIAGNOSIS — M25512 Pain in left shoulder: Principal | ICD-10-CM

## 2018-04-13 DIAGNOSIS — G8929 Other chronic pain: Secondary | ICD-10-CM | POA: Insufficient documentation

## 2018-04-13 DIAGNOSIS — J209 Acute bronchitis, unspecified: Secondary | ICD-10-CM | POA: Insufficient documentation

## 2018-04-13 LAB — COMPREHENSIVE METABOLIC PANEL
ALBUMIN: 4.6 g/dL (ref 3.5–5.2)
ALK PHOS: 75 U/L (ref 39–117)
ALT: 18 U/L (ref 0–53)
AST: 15 U/L (ref 0–37)
BUN: 15 mg/dL (ref 6–23)
CHLORIDE: 102 meq/L (ref 96–112)
CO2: 30 mEq/L (ref 19–32)
Calcium: 9.7 mg/dL (ref 8.4–10.5)
Creatinine, Ser: 1.12 mg/dL (ref 0.40–1.50)
GFR: 68.52 mL/min (ref >60.00)
Glucose, Bld: 130 mg/dL — ABNORMAL HIGH (ref 70–99)
POTASSIUM: 5.1 meq/L (ref 3.5–5.1)
SODIUM: 140 meq/L (ref 135–145)
TOTAL PROTEIN: 6.8 g/dL (ref 6.0–8.3)
Total Bilirubin: 0.4 mg/dL (ref 0.2–1.2)

## 2018-04-13 NOTE — Assessment & Plan Note (Signed)
Increase leafy greens, consider increased lean red meat and using cast iron cookware. Continue to monitor, report any concerns 

## 2018-04-13 NOTE — Assessment & Plan Note (Addendum)
Proceed with Echo and refer to cardiology for evaluation does also endorse some atypical CP at times.

## 2018-04-13 NOTE — Assessment & Plan Note (Signed)
Proceed with xray and referral for ongoing evaluaiton. Encouraged moist heat and gentle stretching as tolerated. May try NSAIDs and prescription meds as directed and report if symptoms worsen or seek immediate care

## 2018-04-13 NOTE — Assessment & Plan Note (Addendum)
Fluoxetine and Wellbutrin continues to help continue same. S[et more than 40 minutes with the patient in counseling and developing treatment plans

## 2018-04-13 NOTE — Progress Notes (Signed)
Subjective:    Patient ID: Aaron Dyer, male    DOB: December 13, 1965, 53 y.o.   MRN: [id number]  No chief complaint on file.   HPI Patient is in today for follow-up accompanied by his wife.  They offer many concerns.  He has been struggling with shoulder pain on the left-hand side for roughly 2 months.  He describes it is as high as 8 out of 10 most notably at night.  It awakens him when he turns over.  Aleve is only marginally helpful.  He denies any trauma or fall.  He also notes daily cough, congestion head and chest.  Has malaise, night sweats, chills and a poor appetite.  The symptoms have been present for over a week.  He notes he has just charted using reading glasses but offers no other visual complaints.  Notes his hearing is slowly worsening but not dramatic and not unilateral.  He has been diagnosed with a new squamous cell carcinoma and is waiting to have that removed again.  He is complaining of worsening dyspnea on exertion noting even walking to the mailbox is getting more difficult.  He notes separate episodes of chest discomfort at times most notably with coughing.  He finally endorses some abdominal pain although he does note rectal bleeding is improved since his hemorrhoidectomy.  Past Medical History:  Diagnosis Date  . Anxiety and depression   . Arthritis    neck  . Diverticulosis of colon without hemorrhage 12/18/2014  . ED (erectile dysfunction)   . GERD (gastroesophageal reflux disease)   . History of adenomatous polyp of colon   . History of adenomatous polyp of colon   . History of exercise stress test 06/09/2006   ECHO ETT-- NORMAL  . History of melanoma excision    nonmalignant and malignant   . History of nonmelanoma skin cancer dermatologist-  dr williford   multiple areas on body,  SCC majority  . Hyperlipidemia   . IBS (irritable bowel syndrome)   . Prolapsed internal hemorrhoids, grade 4     Past Surgical History:  Procedure Laterality Date  .  COLONOSCOPY  last one 11-10-2014  dr pyrtle  . EVALUATION UNDER ANESTHESIA WITH HEMORRHOIDECTOMY N/A 10/17/2017   Procedure: EXAM UNDER ANESTHESIA WITH HEMORRHOIDECTOMY AND HEMORRHOIDAL LIGATION/PEXY TIMES TWO;  Surgeon: Michael Boston, MD;  Location: [facility];  Service: General;  Laterality: N/A;  . EXCISION SEBACEOUS CYST  10-18-2013   '@[facility]'$    ON BACK  . FLAP CLOSURE LEFT CHEEK POST WLE  09-18-2012    '@[facility]'$   . MOHS SURGERY  X2 2010   right ear  . WIDE LOCAL EXCISION LEFT CHEEK AND LEFT CERVICAL SENITNAL LYMPH NODE BX  09-07-2012    '@[facility]'$     Family History  Problem Relation Age of Onset  . COPD Mother   . Emphysema Mother   . Stroke Mother        X 2  . Heart disease Mother        CABG in her 37s  . Hyperlipidemia Mother   . Hypertension Mother   . Diabetes Mother        type 2  . Cancer Father        melanoma  . Stroke Father        35  . Hypertension Father   . Alcohol abuse Father   . Heart disease Father   . Diabetes Maternal Grandmother        type 2  .  Heart disease Maternal Grandmother        CHF  . Heart attack Maternal Grandfather   . Heart disease Paternal Grandmother   . Heart disease Paternal Grandfather   . Colon cancer Neg Hx   . Colon polyps Neg Hx     Social History   Socioeconomic History  . Marital status: Married    Spouse name: Sharyn Lull  . Number of children: 2  . Years of education: Not on file  . Highest education level: Not on file  Occupational History  . Occupation: Designer, multimedia  . Financial resource strain: Not on file  . Food insecurity:    Worry: Not on file    Inability: Not on file  . Transportation needs:    Medical: Not on file    Non-medical: Not on file  Tobacco Use  . Smoking status: Never Smoker  . Smokeless tobacco: Never Used  Substance and Sexual Activity  . Alcohol use: Yes    Alcohol/week: 0.0 standard drinks    Frequency: Never    Comment: OCCASIONAL  . Drug use: No  . Sexual  activity: Not on file  Lifestyle  . Physical activity:    Days per week: Not on file    Minutes per session: Not on file  . Stress: Not on file  Relationships  . Social connections:    Talks on phone: Not on file    Gets together: Not on file    Attends religious service: Not on file    Active member of club or organization: Not on file    Attends meetings of clubs or organizations: Not on file    Relationship status: Not on file  . Intimate partner violence:    Fear of current or ex partner: Not on file    Emotionally abused: Not on file    Physically abused: Not on file    Forced sexual activity: Not on file  Other Topics Concern  . Not on file  Social History Narrative  . Not on file      Allergies  Allergen Reactions  . Crestor [Rosuvastatin] Other (See Comments)    myalgias    Review of Systems  Constitutional: Positive for malaise/fatigue. Negative for fever.  HENT: Negative for congestion.   Eyes: Negative for blurred vision.  Respiratory: Positive for shortness of breath.   Cardiovascular: Positive for chest pain. Negative for palpitations and leg swelling.  Gastrointestinal: Negative for abdominal pain, blood in stool and nausea.  Genitourinary: Negative for dysuria and frequency.  Musculoskeletal: Positive for joint pain. Negative for falls.  Skin: Negative for rash.  Neurological: Negative for dizziness, loss of consciousness and headaches.  Endo/Heme/Allergies: Negative for environmental allergies.  Psychiatric/Behavioral: Positive for depression. The patient has insomnia. The patient is not nervous/anxious.        Objective:    Physical Exam Vitals signs and nursing note reviewed.  Constitutional:      General: He is not in acute distress.    Appearance: He is well-developed.  HENT:     Head: Normocephalic and atraumatic.     Nose: Nose normal.  Eyes:     General:        Right eye: No discharge.        Left eye: No discharge.  Neck:      Musculoskeletal: Normal range of motion and neck supple.  Cardiovascular:     Rate and Rhythm: Normal rate and regular rhythm.     Heart  sounds: No murmur.  Pulmonary:     Effort: Pulmonary effort is normal.     Breath sounds: Normal breath sounds.  Abdominal:     General: Bowel sounds are normal.     Palpations: Abdomen is soft.     Tenderness: There is no abdominal tenderness.  Skin:    General: Skin is warm and dry.  Neurological:     Mental Status: He is alert and oriented to person, place, and time.     BP 112/78 (BP Location: Left Arm, Patient Position: Sitting, Cuff Size: Normal)   Pulse 76   Temp 97.9 F (36.6 C) (Oral)   Resp 18   Wt 166 lb 6.4 oz (75.5 kg)   SpO2 98%   BMI 24.57 kg/m  Wt Readings from Last 3 Encounters:  04/07/18 166 lb 6.4 oz (75.5 kg)  10/17/17 156 lb (70.8 kg)  09/04/17 161 lb 6 oz (73.2 kg)     Lab Results  Component Value Date   WBC 13.0 (H) 04/07/2018   HGB 15.7 04/07/2018   HCT 47.6 04/07/2018   PLT 342 04/07/2018   GLUCOSE 89 04/07/2018   CHOL 255 (H) 04/07/2018   TRIG 187.0 (H) 04/07/2018   HDL 35.60 (L) 04/07/2018   LDLDIRECT 130.2 04/04/2014   LDLCALC 182 (H) 04/07/2018   ALT 17 04/07/2018   AST 14 04/07/2018   NA 137 04/07/2018   K 5.7 (H) 04/07/2018   CL 100 04/07/2018   CREATININE 1.16 04/07/2018   BUN 15 04/07/2018   CO2 32 04/07/2018   TSH 3.59 04/07/2018   PSA 0.48 04/13/2015    Lab Results  Component Value Date   TSH 3.59 04/07/2018   Lab Results  Component Value Date   WBC 13.0 (H) 04/07/2018   HGB 15.7 04/07/2018   HCT 47.6 04/07/2018   MCV 95.6 04/07/2018   PLT 342 04/07/2018   Lab Results  Component Value Date   NA 137 04/07/2018   K 5.7 (H) 04/07/2018   CO2 32 04/07/2018   GLUCOSE 89 04/07/2018   BUN 15 04/07/2018   CREATININE 1.16 04/07/2018   BILITOT 0.4 04/07/2018   ALKPHOS 79 04/07/2018   AST 14 04/07/2018   ALT 17 04/07/2018   PROT 7.5 04/07/2018   ALBUMIN 4.8 04/07/2018    CALCIUM 10.2 04/07/2018   GFR 69.94 04/07/2018   Lab Results  Component Value Date   CHOL 255 (H) 04/07/2018   Lab Results  Component Value Date   HDL 35.60 (L) 04/07/2018   Lab Results  Component Value Date   LDLCALC 182 (H) 04/07/2018   Lab Results  Component Value Date   TRIG 187.0 (H) 04/07/2018   Lab Results  Component Value Date   CHOLHDL 7 04/07/2018   No results found for: HGBA1C     Assessment & Plan:   Problem List Items Addressed This Visit    Anxiety and depression    Fluoxetine and Wellbutrin continues to help continue same. S[et more than 40 minutes with the patient in counseling and developing treatment plans      Relevant Medications   buPROPion (WELLBUTRIN XL) 300 MG 24 hr tablet   FLUoxetine (PROZAC) 20 MG capsule   Skin cancer    Is awaiting removal of an SCC with dermatology      Relevant Medications   ciprofloxacin (CIPRO) 500 MG tablet   Hyperlipidemia   Relevant Medications   fenofibrate micronized (ANTARA) 130 MG capsule   ezetimibe (ZETIA) 10 MG  tablet   FLUoxetine (PROZAC) 20 MG capsule   Other Relevant Orders   Lipid Profile (Completed)   Insomnia    Encouraged good sleep hygiene such as dark, quiet room. No blue/green glowing lights such as computer screens in bedroom. No alcohol or stimulants in evening. Cut down on caffeine as able. Regular exercise is helpful but not just prior to bed time.       Chest pain   Relevant Orders   Comp Met (CMET) (Completed)   TSH (Completed)   RESOLVED: Laceration of skin of scalp   Relevant Medications   FLUoxetine (PROZAC) 20 MG capsule   Back pain   Relevant Medications   cyclobenzaprine (FLEXERIL) 5 MG tablet   diclofenac (VOLTAREN) 75 MG EC tablet   RESOLVED: Elevated WBCs   Relevant Medications   FLUoxetine (PROZAC) 20 MG capsule   Other Relevant Orders   Other/Misc lab test   RESOLVED: Tick bite   Relevant Medications   FLUoxetine (PROZAC) 20 MG capsule   Blood in stool     Doing much better after recovering from hemorroidectomy      Anemia    Increase leafy greens, consider increased lean red meat and using cast iron cookware. Continue to monitor, report any concerns      RESOLVED: Depression   Relevant Medications   buPROPion (WELLBUTRIN XL) 300 MG 24 hr tablet   FLUoxetine (PROZAC) 20 MG capsule   Chronic left shoulder pain - Primary    Proceed with xray and referral for ongoing evaluaiton. Encouraged moist heat and gentle stretching as tolerated. May try NSAIDs and prescription meds as directed and report if symptoms worsen or seek immediate care      Relevant Medications   buPROPion (WELLBUTRIN XL) 300 MG 24 hr tablet   cyclobenzaprine (FLEXERIL) 5 MG tablet   diclofenac (VOLTAREN) 75 MG EC tablet   FLUoxetine (PROZAC) 20 MG capsule   Other Relevant Orders   DG Shoulder Left (Completed)   Ambulatory referral to Sports Medicine   DOE (dyspnea on exertion)    Proceed with Echo and refer to cardiology for evaluation does also endorse some atypical CP at times.      Relevant Orders   ECHOCARDIOGRAM COMPLETE   Ambulatory referral to Cardiology   Abdominal pain   Relevant Orders   DG Abd 2 Views (Completed)   Acute bronchitis    Started on antibiotics, mucinex, Encouraged increased rest and hydration, add probiotics, zinc such as Coldeze or Xicam. Treat fevers as needed       Other Visit Diagnoses    Depression with anxiety       Relevant Medications   buPROPion (WELLBUTRIN XL) 300 MG 24 hr tablet   FLUoxetine (PROZAC) 20 MG capsule   Hemorrhoids, unspecified hemorrhoid type       Relevant Medications   fenofibrate micronized (ANTARA) 130 MG capsule   ezetimibe (ZETIA) 10 MG tablet   FLUoxetine (PROZAC) 20 MG capsule   Low back pain       Relevant Medications   cyclobenzaprine (FLEXERIL) 5 MG tablet   diclofenac (VOLTAREN) 75 MG EC tablet   FLUoxetine (PROZAC) 20 MG capsule   Allergic state, sequela          I have discontinued  Grace Bushy. Swisher "Steve"'s mupirocin ointment, hydrocortisone, hydrocortisone, oxyCODONE, AMBULATORY NON FORMULARY MEDICATION, and ranitidine. I have also changed his buPROPion, fluticasone, diclofenac, and FLUoxetine. Additionally, I am having him start on famotidine, ciprofloxacin, guaiFENesin, and cetirizine. Lastly, I am  having him maintain his fenofibrate micronized, ezetimibe, and cyclobenzaprine.  Meds ordered this encounter  Medications  . buPROPion (WELLBUTRIN XL) 300 MG 24 hr tablet    Sig: Take 1 tablet (300 mg total) by mouth daily.    Dispense:  90 tablet    Refill:  1  . fenofibrate micronized (ANTARA) 130 MG capsule    Sig: Take 1 capsule (130 mg total) by mouth daily.    Dispense:  30 capsule    Refill:  3  . ezetimibe (ZETIA) 10 MG tablet    Sig: Take 1 tablet (10 mg total) by mouth daily.    Dispense:  30 tablet    Refill:  3  . cyclobenzaprine (FLEXERIL) 5 MG tablet    Sig: Take 1 tablet (5 mg total) by mouth 3 (three) times daily as needed for muscle spasms.    Dispense:  21 tablet    Refill:  0  . fluticasone (FLONASE) 50 MCG/ACT nasal spray    Sig: Place 2 sprays into both nostrils daily as needed.    Dispense:  16 g    Refill:  3  . diclofenac (VOLTAREN) 75 MG EC tablet    Sig: Take 1 tablet (75 mg total) by mouth 2 (two) times daily.    Dispense:  30 tablet    Refill:  2    Please consider 90 day supplies to promote better adherence  . FLUoxetine (PROZAC) 20 MG capsule    Sig: Take 1 capsule (20 mg total) by mouth daily.    Dispense:  90 capsule    Refill:  1  . famotidine (PEPCID) 40 MG tablet    Sig: Take 1 tablet (40 mg total) by mouth daily.    Dispense:  90 tablet    Refill:  1  . ciprofloxacin (CIPRO) 500 MG tablet    Sig: Take 1 tablet (500 mg total) by mouth 2 (two) times daily.    Dispense:  20 tablet    Refill:  0  . guaiFENesin (MUCINEX) 600 MG 12 hr tablet    Sig: Take 1 tablet (600 mg total) by mouth 2 (two) times daily as needed.     Dispense:  20 tablet    Refill:  0  . cetirizine (ZYRTEC) 10 MG tablet    Sig: Take 1 tablet (10 mg total) by mouth at bedtime as needed for allergies.    Dispense:  30 tablet    Refill:  11     Penni Homans, MD

## 2018-04-13 NOTE — Assessment & Plan Note (Signed)
Is awaiting removal of an [facility] with dermatology

## 2018-04-13 NOTE — Assessment & Plan Note (Signed)
Started on antibiotics, mucinex, Encouraged increased rest and hydration, add probiotics, zinc such as Coldeze or Xicam. Treat fevers as needed

## 2018-04-13 NOTE — Assessment & Plan Note (Signed)
Doing much better after recovering from hemorroidectomy

## 2018-04-13 NOTE — Assessment & Plan Note (Signed)
Encouraged good sleep hygiene such as dark, quiet room. No blue/green glowing lights such as computer screens in bedroom. No alcohol or stimulants in evening. Cut down on caffeine as able. Regular exercise is helpful but not just prior to bed time.  

## 2018-04-14 ENCOUNTER — Ambulatory Visit (INDEPENDENT_AMBULATORY_CARE_PROVIDER_SITE_OTHER): Payer: BLUE CROSS/BLUE SHIELD | Admitting: Cardiology

## 2018-04-14 ENCOUNTER — Encounter: Payer: Self-pay | Admitting: Cardiology

## 2018-04-14 VITALS — BP 130/80 | HR 81 | Ht 67.5 in | Wt 168.1 lb

## 2018-04-14 DIAGNOSIS — R0609 Other forms of dyspnea: Secondary | ICD-10-CM

## 2018-04-14 DIAGNOSIS — Z7189 Other specified counseling: Secondary | ICD-10-CM

## 2018-04-14 DIAGNOSIS — R072 Precordial pain: Secondary | ICD-10-CM

## 2018-04-14 DIAGNOSIS — Z8249 Family history of ischemic heart disease and other diseases of the circulatory system: Secondary | ICD-10-CM

## 2018-04-14 DIAGNOSIS — Z01812 Encounter for preprocedural laboratory examination: Secondary | ICD-10-CM | POA: Diagnosis not present

## 2018-04-14 DIAGNOSIS — E782 Mixed hyperlipidemia: Secondary | ICD-10-CM | POA: Diagnosis not present

## 2018-04-14 DIAGNOSIS — R079 Chest pain, unspecified: Secondary | ICD-10-CM | POA: Diagnosis not present

## 2018-04-14 LAB — PROTEIN / CREATININE RATIO, URINE
CREATININE, URINE: 77 mg/dL (ref 20–320)
Protein/Creat Ratio: 104 mg/g creat (ref 22–128)
Protein/Creatinine Ratio: 0.104 mg/mg creat (ref 0.022–0.12)
Total Protein, Urine: 8 mg/dL (ref 5–25)

## 2018-04-14 MED ORDER — METOPROLOL TARTRATE 50 MG PO TABS: ORAL_TABLET | ORAL | 0 refills | Status: DC

## 2018-04-14 NOTE — Patient Instructions (Addendum)
Medication Instructions:  Your Physician recommend you continue on your current medication as directed.    If you need a refill on your cardiac medications before your next appointment, please call your pharmacy.   Lab work: Your physician recommends that you return for lab work 1 week prior to procedure.  If you have labs (blood work) drawn today and your tests are completely normal, you will receive your results only by: Marland Kitchen MyChart Message (if you have MyChart) OR . A paper copy in the mail If you have any lab test that is abnormal or we need to change your treatment, we will call you to review the results.  Testing/Procedures: Your physician has requested that you have cardiac CT. Cardiac computed tomography (CT) is a painless test that uses an x-ray machine to take clear, detailed pictures of your heart. For further information please visit [email or web address]. Please follow instruction sheet as given. [facility]    Follow-Up: At [facility], you and your health needs are our priority.  As part of our continuing mission to provide you with exceptional heart care, we have created designated Provider Care Teams.  These Care Teams include your primary Cardiologist (physician) and Advanced Practice Providers (APPs -  Physician Assistants and Nurse Practitioners) who all work together to provide you with the care you need, when you need it. You will need a follow up appointment in 6-8 weeks.  Please call our office 2 months in advance to schedule this appointment.  You may see Dr. Harrell Gave or one of the following Advanced Practice Providers on your designated Care Team:   Rosaria Ferries, PA-C . Jory Sims, DNP, ANP  Please arrive at the [facility] main entrance of [facility] at xx:xx AM (30-45 minutes prior to test start time)  [facility] [address] [place], [place] [address] [phone]  Proceed to the [facility] Radiology Department  (First Floor).  Please follow these instructions carefully (unless otherwise directed):  Hold all erectile dysfunction medications at least 48 hours prior to test.  On the Night Before the Test: . Be sure to Drink plenty of water. . Do not consume any caffeinated/decaffeinated beverages or chocolate 12 hours prior to your test. . Do not take any antihistamines 12 hours prior to your test.    On the Day of the Test: . Drink plenty of water. Do not drink any water within one hour of the test. . Do not eat any food 4 hours prior to the test. . You may take your regular medications prior to the test.  . Take metoprolol (Lopressor) two hours prior to test.      After the Test: . Drink plenty of water. . After receiving IV contrast, you may experience a mild flushed feeling. This is normal. . On occasion, you may experience a mild rash up to 24 hours after the test. This is not dangerous. If this occurs, you can take Benadryl 25 mg and increase your fluid intake. . If you experience trouble breathing, this can be serious. If it is severe call 911 IMMEDIATELY. If it is mild, please call our office.

## 2018-04-14 NOTE — Progress Notes (Signed)
Cardiology Office Note:    Date:  04/14/2018   ID:  Aaron Dyer, DOB [birth year], MRN [id number]  PCP:  Mosie Lukes, MD  Cardiologist:  Buford Dresser, MD PhD  Referring MD: Mosie Lukes, MD   CC: chest pain, dyspnea on exertion, family history of heart disease.  History of Present Illness:    Aaron Dyer is a 53 y.o. male with a hx of hyperlipidemia who is seen as a new consult (last seen by Dr. Stanford Breed in July 08, 2011) at the request of Mosie Lukes, MD for the evaluation and management of dyspnea on exertion, chest pain, family history.  Per recent PCP visit, notes daily cough, congestion, headaches with night sweats, chills, lack of appetite, and general fatigue. Being evaluated for this.  Concerns today:  About a month ago, started having shooting pains in the chest, shortness of breath. Pain is sharp, not related to exertion. Lasts a minute or two. Less than a few times/week. No change in frequency. No radiation, always midsternal. He notes that he has shortness of breath intermittently as well, though not at the same time as the chest pain. It is predominantly dyspnea on exertion, limiting his ability to be active. Has significantly worsened in the last few weeks. Also notes night sweats, occasional left arm pain, again not related in time to chest pain or shortness of breath. Had prior cardiac evaluation >10 years ago, was told it was normal.  Risk: Family history: father died at age 47 from cancer, first blockages in his 56s. Mom had 3V CABG and stents in 07/08/06, passed away from dementia in 2016-07-07. Both parents had strokes. Both were smokers. Paternal uncle passed from MI in his 18s. Grandparents had strokes.   Hyperlipidemia: has been very elevated in the past, has not tolerated statins. See in review below. Never smoker, no hypertension, no diabetes   Past Medical History:  Diagnosis Date  . Anxiety and depression   . Arthritis    neck  . Diverticulosis of  colon without hemorrhage 12/18/2014  . ED (erectile dysfunction)   . GERD (gastroesophageal reflux disease)   . History of adenomatous polyp of colon   . History of adenomatous polyp of colon   . History of exercise stress test 06/09/2006   ECHO ETT-- NORMAL  . History of melanoma excision    nonmalignant and malignant   . History of nonmelanoma skin cancer dermatologist-  dr williford   multiple areas on body,  SCC majority  . Hyperlipidemia   . IBS (irritable bowel syndrome)   . Prolapsed internal hemorrhoids, grade 4     Past Surgical History:  Procedure Laterality Date  . COLONOSCOPY  last one 11-10-2014  dr pyrtle  . EVALUATION UNDER ANESTHESIA WITH HEMORRHOIDECTOMY N/A 10/17/2017   Procedure: EXAM UNDER ANESTHESIA WITH HEMORRHOIDECTOMY AND HEMORRHOIDAL LIGATION/PEXY TIMES TWO;  Surgeon: Michael Boston, MD;  Location: [facility];  Service: General;  Laterality: N/A;  . EXCISION SEBACEOUS CYST  10-18-2013   @[facility]    ON BACK  . FLAP CLOSURE LEFT CHEEK POST WLE  09-18-2012    @[facility]   . MOHS SURGERY  X2 07-Jul-2008   right ear  . WIDE LOCAL EXCISION LEFT CHEEK AND LEFT CERVICAL SENITNAL LYMPH NODE BX  09-07-2012    @[facility]     Current Medications: Current Outpatient Medications on File Prior to Visit  Medication Sig  . buPROPion (WELLBUTRIN XL) 300 MG 24 hr tablet Take 1 tablet (300 mg total) by mouth  daily.  . cetirizine (ZYRTEC) 10 MG tablet Take 1 tablet (10 mg total) by mouth at bedtime as needed for allergies.  . ciprofloxacin (CIPRO) 500 MG tablet Take 1 tablet (500 mg total) by mouth 2 (two) times daily.  . cyclobenzaprine (FLEXERIL) 5 MG tablet Take 1 tablet (5 mg total) by mouth 3 (three) times daily as needed for muscle spasms.  . diclofenac (VOLTAREN) 75 MG EC tablet Take 1 tablet (75 mg total) by mouth 2 (two) times daily.  Marland Kitchen ezetimibe (ZETIA) 10 MG tablet Take 1 tablet (10 mg total) by mouth daily.  . famotidine (PEPCID) 40 MG tablet Take 1 tablet (40 mg total)  by mouth daily.  . fenofibrate micronized (ANTARA) 130 MG capsule Take 1 capsule (130 mg total) by mouth daily.  Marland Kitchen FLUoxetine (PROZAC) 20 MG capsule Take 1 capsule (20 mg total) by mouth daily.  . fluticasone (FLONASE) 50 MCG/ACT nasal spray Place 2 sprays into both nostrils daily as needed.  Marland Kitchen guaiFENesin (MUCINEX) 600 MG 12 hr tablet Take 1 tablet (600 mg total) by mouth 2 (two) times daily as needed.   No current facility-administered medications on file prior to visit.      Allergies:   Crestor [rosuvastatin]   Social History   Socioeconomic History  . Marital status: Married    Spouse name: Sharyn Lull  . Number of children: 2  . Years of education: Not on file  . Highest education level: Not on file  Occupational History  . Occupation: Designer, multimedia  . Financial resource strain: Not on file  . Food insecurity:    Worry: Not on file    Inability: Not on file  . Transportation needs:    Medical: Not on file    Non-medical: Not on file  Tobacco Use  . Smoking status: Never Smoker  . Smokeless tobacco: Never Used  Substance and Sexual Activity  . Alcohol use: Yes    Alcohol/week: 0.0 standard drinks    Frequency: Never    Comment: OCCASIONAL  . Drug use: No  . Sexual activity: Not on file  Lifestyle  . Physical activity:    Days per week: Not on file    Minutes per session: Not on file  . Stress: Not on file  Relationships  . Social connections:    Talks on phone: Not on file    Gets together: Not on file    Attends religious service: Not on file    Active member of club or organization: Not on file    Attends meetings of clubs or organizations: Not on file    Relationship status: Not on file  Other Topics Concern  . Not on file  Social History Narrative  . Not on file     Family History: The patient's family history includes Alcohol abuse in his father; COPD in his mother; Cancer in his father; Diabetes in his maternal grandmother and mother;  Emphysema in his mother; Heart attack in his maternal grandfather; Heart disease in his father, maternal grandmother, mother, paternal grandfather, and paternal grandmother; Hyperlipidemia in his mother; Hypertension in his father and mother; Stroke in his father and mother. There is no history of Colon cancer or Colon polyps.  ROS:   Please see the history of present illness.  Additional pertinent ROS:  Constitutional: Negative for chills, fever, unintentional weight loss. Positive for night sweats, fatigue. HENT: Negative for ear pain and hearing loss.   Eyes: Negative for loss of vision and  eye pain.  Respiratory: Positive for dyspnea on exertion, chronic cough with clear phlegm. Negative for shortness of breath at rest, wheezing.   Cardiovascular: Positive for sharp chest pain. Negative for palpitations, PND, orthopnea, lower extremity edema and claudication.  Gastrointestinal: Negative for abdominal pain, melena, and hematochezia.  Genitourinary: Negative for dysuria and hematuria.  Musculoskeletal: Negative for falls and myalgias.  Skin: Negative for itching and rash.  Neurological: Negative for focal weakness, focal sensory changes and loss of consciousness.  Endo/Heme/Allergies: Does not bruise/bleed easily.    EKGs/Labs/Other Studies Reviewed:    The following studies were reviewed today: Echo scheduled for 04/21/18  ETT 2013: 13 min, 15.4 METs, Peak HR 160 bpm, no ischemia on ECG  Stress echo 2008: 9 min 32 sec on Bruce Peak HR 172 bpm 15 METs No ECG changes Normal baseline echo, appropriate augmentation with stress   EKG:  EKG is personally reviewed.  The ekg ordered today demonstrates normal sinus rhythm  Recent Labs: 06/03/2017: Magnesium 2.3 04/07/2018: Hemoglobin 15.7; Platelets 342; TSH 3.59 04/13/2018: ALT 18; BUN 15; Creatinine, Ser 1.12; Potassium 5.1; Sodium 140  Recent Lipid Panel    Component Value Date/Time   CHOL 255 (H) 04/07/2018 1814   TRIG 187.0 (H)  04/07/2018 1814   HDL 35.60 (L) 04/07/2018 1814   CHOLHDL 7 04/07/2018 1814   VLDL 37.4 04/07/2018 1814   LDLCALC 182 (H) 04/07/2018 1814   LDLDIRECT 130.2 04/04/2014 1700    Physical Exam:    VS:  BP 130/80 (BP Location: Left Arm, Patient Position: Sitting, Cuff Size: Normal)   Pulse 81   Ht 5' 7.5" (1.715 m)   Wt 168 lb 1.9 oz (76.3 kg)   BMI 25.94 kg/m     Wt Readings from Last 3 Encounters:  04/14/18 168 lb 1.9 oz (76.3 kg)  04/07/18 166 lb 6.4 oz (75.5 kg)  10/17/17 156 lb (70.8 kg)     GEN: Well nourished, well developed in no acute distress HEENT: Normal NECK: No JVD; No carotid bruits LYMPHATICS: No lymphadenopathy CARDIAC: regular rhythm, normal S1 and S2, no murmurs, rubs, gallops. Radial and DP pulses 2+ bilaterally. RESPIRATORY:  Clear to auscultation without rales, wheezing or rhonchi  ABDOMEN: Soft, non-tender, non-distended MUSCULOSKELETAL:  No edema; No deformity  SKIN: Warm and dry NEUROLOGIC:  Alert and oriented x 3 PSYCHIATRIC:  Normal affect   ASSESSMENT:    1. Chest pain, unspecified type   2. Pre-procedure lab exam   3. Dyspnea on exertion   4. Mixed hyperlipidemia   5. Family history of heart disease   6. Counseling on health promotion and disease prevention    PLAN:    1. Chest pain: while atypical in nature, he does have risk factors. Discussed pros and cons of treadmill stress vs. Cardiac CT. We both agree that anatomic information from CT would be most helpful in managing his chest pain and cardiovascular risk factors in the future  -cardiac CT  2. Shortness of breath: unremarkable exam. Has pending echo for further evaluation  3. Hyperlipidemia: tried on rosuvastatin (DC 2014), simvastatin (DC 2016), pitavastatin (DC 2014), lovastatin (DC 2013), had cramps on all. Does not believe he was fibrate at the same time as the statin--from my review, appears he has been on fenofibrate since at least 2017. Given his LDL of 182 and TG of 187 on  therapy, I do not think ezetimibe and fenofibrate will be adequate for him.  -if CT scan shows calcium or plaque, I would  pursue PCSK9 inhibitor therapy for him -if CT scan does not show calcium/plaque, I would stop fibrate, wash out, then trial pravastatin. If doesn't tolerate or doesn't improve LDL, then pursue PCKS9 inhibitor -discussed vascepa. Would purse if evidence of CAD on scan. Would be helpful to stop fibrate, then check TG level prior to starting -on review of his prior lipid panels, he has had LDL >200. 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 This fits with his family history TG have also been elevated in the past, though not clear which are fasting/nonfasting, but some >500 -we will discuss next steps in person after the CT results are back. I did discuss with him working with the lipid clinic for help with titration and management of therapy  4. Primary Prevention -recommend heart healthy/Mediterranean diet, with whole grains, fruits, vegetable, fish, lean meats, nuts, and olive oil. Limit salt. -recommend moderate walking, 3-5 times/week for 30-50 minutes each session. Aim for at least 150 minutes.week. Goal should be pace of 3 miles/hours, or walking 1.5 miles in 30 minutes -recommend avoidance of tobacco products. Avoid excess alcohol. -Additional risk factor control:  -Diabetes: A1c is not available  -Lipids: as above  -Blood pressure control: at goal, no history of HTN  -Weight: BMI 25 -ASCVD risk score: The 10-year ASCVD risk score Mikey Bussing DC Brooke Bonito., et al., 2013) is: 8.8%   Values used to calculate the score:     Age: 61 years     Sex: Male     Is Non-Hispanic African American: No     Diabetic: No     Tobacco smoker: No     Systolic Blood Pressure: 270 mmHg     Is BP treated: No     HDL Cholesterol: 35.6 mg/dL     Total Cholesterol: 255 mg/dL   Plan for follow up: 6-8 weeks  Medication Adjustments/Labs and Tests Ordered: Current medicines  are reviewed at length with the patient today.  Concerns regarding medicines are outlined above.  Orders Placed This Encounter  Procedures  . CT CORONARY MORPH W/CTA COR W/SCORE W/CA W/CM &/OR WO/CM  . CT CORONARY FRACTIONAL FLOW RESERVE DATA PREP  . CT CORONARY FRACTIONAL FLOW RESERVE FLUID ANALYSIS  . Basic metabolic panel  . EKG 12-Lead   Meds ordered this encounter  Medications  . metoprolol tartrate (LOPRESSOR) 50 MG tablet    Sig: TAKE 1 TABLET 2 HR PRIOR TO CARDIAC PROCEDURE    Dispense:  1 tablet    Refill:  0    Patient Instructions  Medication Instructions:  Your Physician recommend you continue on your current medication as directed.    If you need a refill on your cardiac medications before your next appointment, please call your pharmacy.   Lab work: Your physician recommends that you return for lab work 1 week prior to procedure.  If you have labs (blood work) drawn today and your tests are completely normal, you will receive your results only by: Marland Kitchen MyChart Message (if you have MyChart) OR . A paper copy in the mail If you have any lab test that is abnormal or we need to change your treatment, we will call you to review the results.  Testing/Procedures: Your physician has requested that you have cardiac CT. Cardiac computed tomography (CT) is a painless test that uses an x-ray machine to take clear, detailed pictures of your heart. For further information please visit [email or web address][email or web address]. Please follow instruction sheet as given. [facility]    Follow-Up: At  [facility], you and your health needs are our priority.  As part of our continuing mission to provide you with exceptional heart care, we have created designated Provider Care Teams.  These Care Teams include your primary Cardiologist (physician) and Advanced Practice Providers (APPs -  Physician Assistants and Nurse Practitioners) who all work together to provide you with the care you need, when  you need it. You will need a follow up appointment in 6-8 weeks.  Please call our office 2 months in advance to schedule this appointment.  You may see Dr. Harrell Gave or one of the following Advanced Practice Providers on your designated Care Team:   Rosaria Ferries, PA-C . Jory Sims, DNP, ANP  Please arrive at the [facility] main entrance of [facility] at xx:xx AM (30-45 minutes prior to test start time)  [facility] [place], [place] [address] [phone]  Proceed to the [facility] Radiology Department (First Floor).  Please follow these instructions carefully (unless otherwise directed):  Hold all erectile dysfunction medications at least 48 hours prior to test.  On the Night Before the Test: . Be sure to Drink plenty of water. . Do not consume any caffeinated/decaffeinated beverages or chocolate 12 hours prior to your test. . Do not take any antihistamines 12 hours prior to your test.    On the Day of the Test: . Drink plenty of water. Do not drink any water within one hour of the test. . Do not eat any food 4 hours prior to the test. . You may take your regular medications prior to the test.  . Take metoprolol (Lopressor) two hours prior to test.      After the Test: . Drink plenty of water. . After receiving IV contrast, you may experience a mild flushed feeling. This is normal. . On occasion, you may experience a mild rash up to 24 hours after the test. This is not dangerous. If this occurs, you can take Benadryl 25 mg and increase your fluid intake. . If you experience trouble breathing, this can be serious. If it is severe call 911 IMMEDIATELY. If it is mild, please call our office.          Signed, Buford Dresser, MD PhD 04/14/2018 4:06 PM    [facility]

## 2018-04-15 LAB — PROTEIN ELECTROPHORESIS, SERUM
ALPHA 2: 0.7 g/dL (ref 0.5–0.9)
Albumin ELP: 4.3 g/dL (ref 3.8–4.8)
Alpha 1: 0.3 g/dL (ref 0.2–0.3)
Beta 2: 0.3 g/dL (ref 0.2–0.5)
Beta Globulin: 0.4 g/dL (ref 0.4–0.6)
GAMMA GLOBULIN: 0.7 g/dL — AB (ref 0.8–1.7)
Total Protein: 6.7 g/dL (ref 6.1–8.1)

## 2018-04-16 ENCOUNTER — Telehealth: Payer: Self-pay | Admitting: *Deleted

## 2018-04-16 ENCOUNTER — Ambulatory Visit (HOSPITAL_BASED_OUTPATIENT_CLINIC_OR_DEPARTMENT_OTHER)
Admission: RE | Admit: 2018-04-16 | Discharge: 2018-04-16 | Disposition: A | Discharge status: Home / Self Care | Payer: BLUE CROSS/BLUE SHIELD | Source: Ambulatory Visit | Attending: Family Medicine | Admitting: Family Medicine

## 2018-04-16 DIAGNOSIS — N281 Cyst of kidney, acquired: Secondary | ICD-10-CM | POA: Diagnosis not present

## 2018-04-16 DIAGNOSIS — R160 Hepatomegaly, not elsewhere classified: Secondary | ICD-10-CM | POA: Diagnosis not present

## 2018-04-16 DIAGNOSIS — K5900 Constipation, unspecified: Secondary | ICD-10-CM

## 2018-04-16 DIAGNOSIS — R109 Unspecified abdominal pain: Secondary | ICD-10-CM | POA: Diagnosis not present

## 2018-04-16 NOTE — Telephone Encounter (Signed)
Already responded in result note. Please contact patient per note

## 2018-04-16 NOTE — Telephone Encounter (Signed)
Tracy:Call report  [place] results: calling to make sure provider is aware results are viewable in the chart

## 2018-04-17 ENCOUNTER — Ambulatory Visit: Payer: Medicare Other | Admitting: Family Medicine

## 2018-04-17 NOTE — Telephone Encounter (Signed)
Called patient left message for patient to call the office back  

## 2018-04-21 ENCOUNTER — Ambulatory Visit (HOSPITAL_BASED_OUTPATIENT_CLINIC_OR_DEPARTMENT_OTHER)
Admission: RE | Admit: 2018-04-21 | Discharge: 2018-04-21 | Disposition: A | Discharge status: Home / Self Care | Payer: BLUE CROSS/BLUE SHIELD | Source: Ambulatory Visit | Attending: Family Medicine | Admitting: Family Medicine

## 2018-04-21 DIAGNOSIS — R0609 Other forms of dyspnea: Secondary | ICD-10-CM | POA: Diagnosis not present

## 2018-04-21 NOTE — Progress Notes (Signed)
  Echocardiogram 2D Echocardiogram has been performed.  Keymora Grillot T Maize Brittingham 04/21/2018, 2:50 PM

## 2018-05-06 ENCOUNTER — Other Ambulatory Visit: Payer: Self-pay | Admitting: Family Medicine

## 2018-05-06 ENCOUNTER — Encounter: Payer: Self-pay | Admitting: Family Medicine

## 2018-05-06 DIAGNOSIS — K769 Liver disease, unspecified: Secondary | ICD-10-CM

## 2018-05-09 ENCOUNTER — Ambulatory Visit (HOSPITAL_BASED_OUTPATIENT_CLINIC_OR_DEPARTMENT_OTHER)
Admission: RE | Admit: 2018-05-09 | Discharge: 2018-05-09 | Disposition: A | Discharge status: Home / Self Care | Payer: BLUE CROSS/BLUE SHIELD | Source: Ambulatory Visit | Attending: Family Medicine | Admitting: Family Medicine

## 2018-05-09 DIAGNOSIS — D1802 Hemangioma of intracranial structures: Secondary | ICD-10-CM | POA: Diagnosis not present

## 2018-05-09 DIAGNOSIS — K769 Liver disease, unspecified: Secondary | ICD-10-CM | POA: Diagnosis not present

## 2018-05-09 MED ORDER — GADOBUTROL 1 MMOL/ML IV SOLN
7.0000 mL | Freq: Once | INTRAVENOUS | Status: AC | PRN
  Administered 2018-05-09: 7 mL via INTRAVENOUS

## 2018-05-11 DIAGNOSIS — C44629 Squamous cell carcinoma of skin of left upper limb, including shoulder: Secondary | ICD-10-CM | POA: Diagnosis not present

## 2018-05-11 DIAGNOSIS — L905 Scar conditions and fibrosis of skin: Secondary | ICD-10-CM | POA: Diagnosis not present

## 2018-05-11 NOTE — Addendum Note (Signed)
Addended by: Meryl Crutch on: 05/11/2018 10:22 AM   Modules accepted: Orders

## 2018-05-22 ENCOUNTER — Telehealth (HOSPITAL_COMMUNITY): Payer: Self-pay | Admitting: Emergency Medicine

## 2018-05-22 NOTE — Telephone Encounter (Signed)
Reaching out to patient to offer assistance regarding upcoming cardiac imaging study; pt verbalizes understanding of appt date/time, parking situation and where to check in, pre-test NPO status and medications ordered, and verified current allergies; name and call back number provided for further questions should they arise Elaysia Devargas RN Navigator Cardiac Imaging [facility] [phone] office [phone] cell 

## 2018-05-25 ENCOUNTER — Ambulatory Visit (HOSPITAL_COMMUNITY): Admission: RE | Admit: 2018-05-25 | Payer: BLUE CROSS/BLUE SHIELD | Source: Ambulatory Visit

## 2018-05-25 ENCOUNTER — Encounter (HOSPITAL_COMMUNITY): Payer: Self-pay

## 2018-05-25 ENCOUNTER — Ambulatory Visit (HOSPITAL_COMMUNITY)
Admission: RE | Admit: 2018-05-25 | Discharge: 2018-05-25 | Disposition: A | Discharge status: Home / Self Care | Payer: BLUE CROSS/BLUE SHIELD | Source: Ambulatory Visit | Attending: Cardiology | Admitting: Cardiology

## 2018-05-25 DIAGNOSIS — R072 Precordial pain: Secondary | ICD-10-CM | POA: Insufficient documentation

## 2018-05-25 MED ORDER — IOPAMIDOL (ISOVUE-370) INJECTION 76%
80.0000 mL | Freq: Once | INTRAVENOUS | Status: AC | PRN
  Administered 2018-05-25: 80 mL via INTRAVENOUS

## 2018-05-25 MED ORDER — NITROGLYCERIN 0.4 MG SL SUBL
0.8000 mg | SUBLINGUAL_TABLET | Freq: Once | SUBLINGUAL | Status: AC
  Administered 2018-05-25: 0.8 mg via SUBLINGUAL
  Filled 2018-05-25: qty 25

## 2018-05-25 MED ORDER — NITROGLYCERIN 0.4 MG SL SUBL
SUBLINGUAL_TABLET | SUBLINGUAL | Status: AC
  Filled 2018-05-25: qty 2

## 2018-05-26 ENCOUNTER — Telehealth: Payer: Self-pay | Admitting: Cardiology

## 2018-05-26 NOTE — Telephone Encounter (Signed)
He can keep his appt with me and I will try to get PharmD to do teaching on same day if we can. For now, continue the zetia. Thanks!

## 2018-05-26 NOTE — Telephone Encounter (Signed)
Patient returned call for CT Scan result.

## 2018-05-26 NOTE — Telephone Encounter (Signed)
Advised patient, verbalized understanding. Patient scheduled 06/11/18 with Pharm D  1) keep visit with Dr Harrell Gave next week?  2) continue Zetia?   Will forward to Dr Harrell Gave for review   Notes recorded by Buford Dresser, MD on 05/25/2018 at 5:19 PM EST No evidence of tight blockages, but high degree of coronary calcium (90th percentile for age) and some mild blockages. This is consistent with non-obstructive coronary artery disease. I think given that there is evidence of early blockage and intolerance to statins, I would recommend meeting with our PharmD to discuss PCSK9 inhibitor therapy.

## 2018-05-27 NOTE — Telephone Encounter (Signed)
Pt updated with MD's recommendations and voiced understanding.

## 2018-06-02 ENCOUNTER — Ambulatory Visit: Payer: Medicare Other | Admitting: Family Medicine

## 2018-06-02 ENCOUNTER — Ambulatory Visit (INDEPENDENT_AMBULATORY_CARE_PROVIDER_SITE_OTHER): Payer: BLUE CROSS/BLUE SHIELD | Admitting: Cardiology

## 2018-06-02 ENCOUNTER — Encounter: Payer: Self-pay | Admitting: Cardiology

## 2018-06-02 VITALS — BP 131/87 | HR 79 | Ht 69.0 in | Wt 168.6 lb

## 2018-06-02 DIAGNOSIS — I2510 Atherosclerotic heart disease of native coronary artery without angina pectoris: Secondary | ICD-10-CM

## 2018-06-02 DIAGNOSIS — Z712 Person consulting for explanation of examination or test findings: Secondary | ICD-10-CM

## 2018-06-02 DIAGNOSIS — I2583 Coronary atherosclerosis due to lipid rich plaque: Secondary | ICD-10-CM | POA: Diagnosis not present

## 2018-06-02 DIAGNOSIS — E782 Mixed hyperlipidemia: Secondary | ICD-10-CM | POA: Diagnosis not present

## 2018-06-02 DIAGNOSIS — Z7189 Other specified counseling: Secondary | ICD-10-CM

## 2018-06-02 MED ORDER — ASPIRIN EC 81 MG PO TBEC: 81.0000 mg | DELAYED_RELEASE_TABLET | Freq: Every day | ORAL | 3 refills | Status: DC

## 2018-06-02 NOTE — Progress Notes (Signed)
Cardiology Office Note:    Date:  06/02/2018   ID:  Aaron Dyer, DOB 05/01/1965, MRN [id number]  PCP:  Mosie Lukes, MD  Cardiologist:  Buford Dresser, MD PhD  Referring MD: Mosie Lukes, MD   CC: follow up of chest pain, dyspnea on exertion, family history of heart disease.  History of Present Illness:    Aaron Dyer is a 53 y.o. male with a hx of hyperlipidemia who is seen in follow up today. He wasseen on 04/14/18 as a new consult (last seen by Dr. Stanford Breed in 06/29/11) at the request of Mosie Lukes, MD for the evaluation and management of dyspnea on exertion, chest pain, family history.  Pertinent cardiac history:  Had shooting chest pain, sharp, nonexertional. Also has dyspnea on exertion. Has history of hyperlipidemia with multiple statin intolerances, never smoker, no hypertension, no diabetes. Family history: father died at age 47 from cancer, first blockages in his 17s. Mom had 3V CABG and stents in June 29, 2006, passed away from dementia in 28-Jun-2016. Both parents had strokes. Both were smokers. Paternal uncle passed from MI in his 68s. Grandparents had strokes.   Today: reviewed results of CT coronary and echo, as well as lipids. Given the CT coronary findings, he now has a diagnosis of coronary artery disease. Spent significant time talking about secondary prevention, with LDL goal <70. He is tolerating the ezetimibe and fenofibrate, but we discussed that this was not going to get him to target LDL. Discussed PCSK9 inhibitor and had Tommy Medal discuss with him as well.   Past Medical History:  Diagnosis Date  . Anxiety and depression   . Arthritis    neck  . Diverticulosis of colon without hemorrhage 12/18/2014  . ED (erectile dysfunction)   . GERD (gastroesophageal reflux disease)   . History of adenomatous polyp of colon   . History of adenomatous polyp of colon   . History of exercise stress test 06/09/2006   ECHO ETT-- NORMAL  . History of melanoma excision     nonmalignant and malignant   . History of nonmelanoma skin cancer dermatologist-  dr williford   multiple areas on body,  SCC majority  . Hyperlipidemia   . IBS (irritable bowel syndrome)   . Prolapsed internal hemorrhoids, grade 4     Past Surgical History:  Procedure Laterality Date  . COLONOSCOPY  last one 11-10-2014  dr pyrtle  . EVALUATION UNDER ANESTHESIA WITH HEMORRHOIDECTOMY N/A 10/17/2017   Procedure: EXAM UNDER ANESTHESIA WITH HEMORRHOIDECTOMY AND HEMORRHOIDAL LIGATION/PEXY TIMES TWO;  Surgeon: Michael Boston, MD;  Location: [facility];  Service: General;  Laterality: N/A;  . EXCISION SEBACEOUS CYST  10-18-2013   @[facility]    ON BACK  . FLAP CLOSURE LEFT CHEEK POST WLE  09-18-2012    @[facility]   . MOHS SURGERY  X2 06/28/08   right ear  . WIDE LOCAL EXCISION LEFT CHEEK AND LEFT CERVICAL SENITNAL LYMPH NODE BX  09-07-2012    @[facility]     Current Medications: Current Outpatient Medications on File Prior to Visit  Medication Sig  . buPROPion (WELLBUTRIN XL) 300 MG 24 hr tablet Take 1 tablet (300 mg total) by mouth daily.  . cetirizine (ZYRTEC) 10 MG tablet Take 1 tablet (10 mg total) by mouth at bedtime as needed for allergies.  . ciprofloxacin (CIPRO) 500 MG tablet Take 1 tablet (500 mg total) by mouth 2 (two) times daily.  . cyclobenzaprine (FLEXERIL) 5 MG tablet Take 1 tablet (5 mg  total) by mouth 3 (three) times daily as needed for muscle spasms.  . diclofenac (VOLTAREN) 75 MG EC tablet Take 1 tablet (75 mg total) by mouth 2 (two) times daily.  Marland Kitchen ezetimibe (ZETIA) 10 MG tablet Take 1 tablet (10 mg total) by mouth daily.  . famotidine (PEPCID) 40 MG tablet Take 1 tablet (40 mg total) by mouth daily.  Marland Kitchen FLUoxetine (PROZAC) 20 MG capsule Take 1 capsule (20 mg total) by mouth daily.  . fluticasone (FLONASE) 50 MCG/ACT nasal spray Place 2 sprays into both nostrils daily as needed.  Marland Kitchen guaiFENesin (MUCINEX) 600 MG 12 hr tablet Take 1 tablet (600 mg total) by mouth 2 (two) times  daily as needed.   No current facility-administered medications on file prior to visit.      Allergies:   Crestor [rosuvastatin]   Social History   Socioeconomic History  . Marital status: Married    Spouse name: Sharyn Lull  . Number of children: 2  . Years of education: Not on file  . Highest education level: Not on file  Occupational History  . Occupation: Designer, multimedia  . Financial resource strain: Not on file  . Food insecurity:    Worry: Not on file    Inability: Not on file  . Transportation needs:    Medical: Not on file    Non-medical: Not on file  Tobacco Use  . Smoking status: Never Smoker  . Smokeless tobacco: Never Used  Substance and Sexual Activity  . Alcohol use: Yes    Alcohol/week: 0.0 standard drinks    Frequency: Never    Comment: OCCASIONAL  . Drug use: No  . Sexual activity: Not on file  Lifestyle  . Physical activity:    Days per week: Not on file    Minutes per session: Not on file  . Stress: Not on file  Relationships  . Social connections:    Talks on phone: Not on file    Gets together: Not on file    Attends religious service: Not on file    Active member of club or organization: Not on file    Attends meetings of clubs or organizations: Not on file    Relationship status: Not on file  Other Topics Concern  . Not on file  Social History Narrative  . Not on file     Family History: The patient's family history includes Alcohol abuse in his father; COPD in his mother; Cancer in his father; Diabetes in his maternal grandmother and mother; Emphysema in his mother; Heart attack in his maternal grandfather; Heart disease in his father, maternal grandmother, mother, paternal grandfather, and paternal grandmother; Hyperlipidemia in his mother; Hypertension in his father and mother; Stroke in his father and mother. There is no history of Colon cancer or Colon polyps.  ROS:   Please see the history of present illness.  Additional  pertinent ROS:  Constitutional: Negative for chills, fever, unintentional weight loss. Positive for night sweats, fatigue. HENT: Negative for ear pain and hearing loss.   Eyes: Negative for loss of vision and eye pain.  Respiratory: Positive for dyspnea on exertion, chronic cough with clear phlegm. Negative for shortness of breath at rest, wheezing.   Cardiovascular: Positive for sharp chest pain. Negative for palpitations, PND, orthopnea, lower extremity edema and claudication.  Gastrointestinal: Negative for abdominal pain, melena, and hematochezia.  Genitourinary: Negative for dysuria and hematuria.  Musculoskeletal: Negative for falls and myalgias.  Skin: Negative for itching and rash.  Neurological: Negative for focal weakness, focal sensory changes and loss of consciousness.  Endo/Heme/Allergies: Does not bruise/bleed easily.    EKGs/Labs/Other Studies Reviewed:    The following studies were reviewed today: CT coronary 05/25/18 Coronary Arteries:  Normal coronary origin.  Right dominance.  RCA is a non-dominant artery that has minimal soft plaque in the mid vessel.  Left main is a large artery that gives rise to LAD and LCX arteries.  LAD is a large vessel that has 30-40% mixed plaque in the proximal and mid regions and 10-20% calcified plaque distally. There are 4 small diagonal vessels. There is 20-30% calcified plaque at the ostium of D1, and the diagonals are otherwise without significant disease.  LCX is a large, dominant artery that gives rise to three OM branches and the PDA. There is 10-20% calcified plaque in the mid LCX at the level of OM1.  IMPRESSION: Coronary calcium score of 192. This was 90th percentile for age and sex matched control.  Echo 04/21/18 - Left ventricle: The cavity size was normal. Systolic function was   normal. The estimated ejection fraction was in the range of 55%   to 60%. Wall motion was normal; there were no regional wall    motion abnormalities. Doppler parameters are consistent with   abnormal left ventricular relaxation (grade 1 diastolic   dysfunction).  ETT 2013: 13 min, 15.4 METs, Peak HR 160 bpm, no ischemia on ECG  Stress echo 2008: 9 min 32 sec on Bruce Peak HR 172 bpm 15 METs No ECG changes Normal baseline echo, appropriate augmentation with stress   EKG:  EKG is personally reviewed.  The ekg ordered 04/14/18 demonstrates normal sinus rhythm  Recent Labs: 06/03/2017: Magnesium 2.3 04/07/2018: Hemoglobin 15.7; Platelets 342; TSH 3.59 04/13/2018: ALT 18; BUN 15; Creatinine, Ser 1.12; Potassium 5.1; Sodium 140  Recent Lipid Panel    Component Value Date/Time   CHOL 255 (H) 04/07/2018 1814   TRIG 187.0 (H) 04/07/2018 1814   HDL 35.60 (L) 04/07/2018 1814   CHOLHDL 7 04/07/2018 1814   VLDL 37.4 04/07/2018 1814   LDLCALC 182 (H) 04/07/2018 1814   LDLDIRECT 130.2 04/04/2014 1700    Physical Exam:    VS:  BP 131/87   Pulse 79   Ht 5\' 9"  (1.753 m)   Wt 168 lb 9.6 oz (76.5 kg)   BMI 24.90 kg/m     Wt Readings from Last 3 Encounters:  06/02/18 168 lb 9.6 oz (76.5 kg)  04/14/18 168 lb 1.9 oz (76.3 kg)  04/07/18 166 lb 6.4 oz (75.5 kg)     GEN: Well nourished, well developed in no acute distress HEENT: Normal NECK: No JVD; No carotid bruits LYMPHATICS: No lymphadenopathy CARDIAC: regular rhythm, normal S1 and S2, no murmurs, rubs, gallops. Radial and DP pulses 2+ bilaterally. RESPIRATORY:  Clear to auscultation without rales, wheezing or rhonchi  ABDOMEN: Soft, non-tender, non-distended MUSCULOSKELETAL:  No edema; No deformity  SKIN: Warm and dry NEUROLOGIC:  Alert and oriented x 3 PSYCHIATRIC:  Normal affect   ASSESSMENT:    1. Coronary artery disease due to lipid rich plaque   2. Encounter to discuss test results   3. Counseling on health promotion and disease prevention   4. Mixed hyperlipidemia    PLAN:    1. Chest pain, shortness of breath: Discussed test results re:  workup. echo unremarkable, CT coronary without high grade stenosis that would suggest cardiac etiology for his symptoms  2. Coronary artery disease: seen on CT  coronary angiography  -secondary prevention discussed  -recommended starting aspirin 81 mg  -has not tolerated multiple statins, below.   3. Hyperlipidemia: tried on rosuvastatin (DC 2014), simvastatin (DC 2016), pitavastatin (DC 2014), lovastatin (DC 2013), had cramps on all. Does not believe he was fibrate at the same time as the statin--from my review, appears he has been on fenofibrate since at least 2017. Given his LDL of 182 and TG of 187 on therapy, I do not think ezetimibe and fenofibrate will be adequate for him.  -given CAD on imaging, will pursue PCSK9 inhibitor. Discussed risk/benefit today. -on follow up labs, if TG still elevated, consider vascepa. Will stop fenofibrate today as this has no data for long term CAD risk improvement. -on review of his prior lipid panels, he has had LDL >200. 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 This fits with his family history TG have also been elevated in the past, though not clear which are fasting/nonfasting, but some >500  4. Secondary Prevention -recommend heart healthy/Mediterranean diet, with whole grains, fruits, vegetable, fish, lean meats, nuts, and olive oil. Limit salt. -recommend moderate walking, 3-5 times/week for 30-50 minutes each session. Aim for at least 150 minutes.week. Goal should be pace of 3 miles/hours, or walking 1.5 miles in 30 minutes -recommend avoidance of tobacco products. Avoid excess alcohol. -Additional risk factor control:  -Diabetes: A1c is not available  -Lipids: as above  -Blood pressure control: goal <130/80, borderline today. Continue to monitor.  -Weight: BMI 25  Plan for follow up: 6 mos or sooner PRN  TIME SPENT WITH PATIENT: >25 minutes of direct patient care. More than 50% of that time was spent on coordination of  care and counseling regarding test results, recommendations for medication adjustment.  Buford Dresser, MD, PhD [place]  [facility]   Medication Adjustments/Labs and Tests Ordered: Current medicines are reviewed at length with the patient today.  Concerns regarding medicines are outlined above.  No orders of the defined types were placed in this encounter.  Meds ordered this encounter  Medications  . aspirin EC 81 MG tablet    Sig: Take 1 tablet (81 mg total) by mouth daily.    Dispense:  90 tablet    Refill:  3    Patient Instructions  Medication Instructions:  Stop: Fenofibrate 130 mg Start: Aspirin 81 mg daily  If you need a refill on your cardiac medications before your next appointment, please call your pharmacy.   Lab work: None   Testing/Procedures: None  Follow-Up: At [facility], you and your health needs are our priority.  As part of our continuing mission to provide you with exceptional heart care, we have created designated Provider Care Teams.  These Care Teams include your primary Cardiologist (physician) and Advanced Practice Providers (APPs -  Physician Assistants and Nurse Practitioners) who all work together to provide you with the care you need, when you need it. You will need a follow up appointment in 6 months.  Please call our office 2 months in advance to schedule this appointment.  You may see Buford Dresser, MD or one of the following Advanced Practice Providers on your designated Care Team:   Rosaria Ferries, PA-C . Jory Sims, DNP, ANP        Signed, Buford Dresser, MD PhD 06/02/2018 3:38 PM    [place]

## 2018-06-02 NOTE — Patient Instructions (Signed)
Medication Instructions:  Stop: Fenofibrate 130 mg Start: Aspirin 81 mg daily  If you need a refill on your cardiac medications before your next appointment, please call your pharmacy.   Lab work: None   Testing/Procedures: None  Follow-Up: At [facility], you and your health needs are our priority.  As part of our continuing mission to provide you with exceptional heart care, we have created designated Provider Care Teams.  These Care Teams include your primary Cardiologist (physician) and Advanced Practice Providers (APPs -  Physician Assistants and Nurse Practitioners) who all work together to provide you with the care you need, when you need it. You will need a follow up appointment in 6 months.  Please call our office 2 months in advance to schedule this appointment.  You may see Buford Dresser, MD or one of the following Advanced Practice Providers on your designated Care Team:   Rosaria Ferries, PA-C . Jory Sims, DNP, ANP

## 2018-06-08 ENCOUNTER — Encounter: Payer: Self-pay | Admitting: Family Medicine

## 2018-06-08 ENCOUNTER — Ambulatory Visit (INDEPENDENT_AMBULATORY_CARE_PROVIDER_SITE_OTHER): Payer: BLUE CROSS/BLUE SHIELD | Admitting: Family Medicine

## 2018-06-08 ENCOUNTER — Ambulatory Visit (HOSPITAL_BASED_OUTPATIENT_CLINIC_OR_DEPARTMENT_OTHER)
Admission: RE | Admit: 2018-06-08 | Discharge: 2018-06-08 | Disposition: A | Discharge status: Home / Self Care | Payer: BLUE CROSS/BLUE SHIELD | Source: Ambulatory Visit | Attending: Family Medicine | Admitting: Family Medicine

## 2018-06-08 ENCOUNTER — Other Ambulatory Visit: Payer: Self-pay

## 2018-06-08 VITALS — BP 126/68 | HR 78 | Temp 97.8°F | Resp 16 | Ht 69.0 in | Wt 168.1 lb

## 2018-06-08 DIAGNOSIS — I2510 Atherosclerotic heart disease of native coronary artery without angina pectoris: Secondary | ICD-10-CM

## 2018-06-08 DIAGNOSIS — R079 Chest pain, unspecified: Secondary | ICD-10-CM

## 2018-06-08 DIAGNOSIS — R059 Cough, unspecified: Secondary | ICD-10-CM

## 2018-06-08 DIAGNOSIS — I2583 Coronary atherosclerosis due to lipid rich plaque: Secondary | ICD-10-CM

## 2018-06-08 DIAGNOSIS — F329 Major depressive disorder, single episode, unspecified: Secondary | ICD-10-CM

## 2018-06-08 DIAGNOSIS — E782 Mixed hyperlipidemia: Secondary | ICD-10-CM | POA: Diagnosis not present

## 2018-06-08 DIAGNOSIS — R05 Cough: Secondary | ICD-10-CM

## 2018-06-08 DIAGNOSIS — R739 Hyperglycemia, unspecified: Secondary | ICD-10-CM | POA: Diagnosis not present

## 2018-06-08 DIAGNOSIS — F32A Depression, unspecified: Secondary | ICD-10-CM

## 2018-06-08 DIAGNOSIS — L578 Other skin changes due to chronic exposure to nonionizing radiation: Secondary | ICD-10-CM

## 2018-06-08 DIAGNOSIS — F419 Anxiety disorder, unspecified: Secondary | ICD-10-CM

## 2018-06-08 MED ORDER — NAPROXEN 375 MG PO TABS: 375.0000 mg | ORAL_TABLET | Freq: Two times a day (BID) | ORAL | 2 refills | Status: DC | PRN

## 2018-06-08 NOTE — Patient Instructions (Signed)
Cough, Adult  Coughing is a reflex that clears your throat and your airways. Coughing helps to heal and protect your lungs. It is normal to cough occasionally, but a cough that happens with other symptoms or lasts a long time may be a sign of a condition that needs treatment. A cough may last only 2-3 weeks (acute), or it may last longer than 8 weeks (chronic). What are the causes? Coughing is commonly caused by:  Breathing in substances that irritate your lungs.  A viral or bacterial respiratory infection.  Allergies.  Asthma.  Postnasal drip.  Smoking.  Acid backing up from the stomach into the esophagus (gastroesophageal reflux).  Certain medicines.  Chronic lung problems, including COPD (or rarely, lung cancer).  Other medical conditions such as heart failure. Follow these instructions at home: Pay attention to any changes in your symptoms. Take these actions to help with your discomfort:  Take medicines only as told by your health care provider. ? If you were prescribed an antibiotic medicine, take it as told by your health care provider. Do not stop taking the antibiotic even if you start to feel better. ? Talk with your health care provider before you take a cough suppressant medicine.  Drink enough fluid to keep your urine clear or pale yellow.  If the air is dry, use a cold steam vaporizer or humidifier in your bedroom or your home to help loosen secretions.  Avoid anything that causes you to cough at work or at home.  If your cough is worse at night, try sleeping in a semi-upright position.  Avoid cigarette smoke. If you smoke, quit smoking. If you need help quitting, ask your health care provider.  Avoid caffeine.  Avoid alcohol.  Rest as needed. Contact a health care provider if:  You have new symptoms.  You cough up pus.  Your cough does not get better after 2-3 weeks, or your cough gets worse.  You cannot control your cough with suppressant  medicines and you are losing sleep.  You develop pain that is getting worse or pain that is not controlled with pain medicines.  You have a fever.  You have unexplained weight loss.  You have night sweats. Get help right away if:  You cough up blood.  You have difficulty breathing.  Your heartbeat is very fast. This information is not intended to replace advice given to you by your health care provider. Make sure you discuss any questions you have with your health care provider. Document Released: 09/07/2010 Document Revised: 08/17/2015 Document Reviewed: 05/18/2014 Elsevier Interactive Patient Education  2019 [employer]  

## 2018-06-08 NOTE — Assessment & Plan Note (Addendum)
Just had a LUE SCC removed recently and has done well. Has had some mild swelling in that hand but it is improving and no pain associated.

## 2018-06-08 NOTE — Assessment & Plan Note (Signed)
Doing well on current meds, no changes

## 2018-06-08 NOTE — Assessment & Plan Note (Addendum)
Encouraged heart healthy diet, increase exercise, avoid trans fats, consider a krill oil cap daily. Statin intolerant is now going to be at the lipid clinic for medications.

## 2018-06-08 NOTE — Assessment & Plan Note (Signed)
Coronary calcium score at 90 % so he is now following with cardiology.

## 2018-06-08 NOTE — Progress Notes (Signed)
Subjective:    Patient ID: Aaron Dyer, male    DOB: July 10, 1965, 53 y.o.   MRN: [id number]  Chief Complaint  Patient presents with  . Follow-up    HPI Patient is in today for follow up. No recent febrile illness or hospitalizations. 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 His anxiety and depression are well controlled on current meds. No anhedonia noted. No further chest pain. Notes some intermittent back pain at times. No fall or trauma. Denies CP/palp/SOB/HA/congestion/fevers/GI or GU c/o. Taking meds as prescribed  Past Medical History:  Diagnosis Date  . Anxiety and depression   . Arthritis    neck  . Diverticulosis of colon without hemorrhage 12/18/2014  . ED (erectile dysfunction)   . GERD (gastroesophageal reflux disease)   . History of adenomatous polyp of colon   . History of adenomatous polyp of colon   . History of exercise stress test 06/09/2006   ECHO ETT-- NORMAL  . History of melanoma excision    nonmalignant and malignant   . History of nonmelanoma skin cancer dermatologist-  dr williford   multiple areas on body,  SCC majority  . Hyperlipidemia   . IBS (irritable bowel syndrome)   . Prolapsed internal hemorrhoids, grade 4     Past Surgical History:  Procedure Laterality Date  . COLONOSCOPY  last one 11-10-2014  dr pyrtle  . EVALUATION UNDER ANESTHESIA WITH HEMORRHOIDECTOMY N/A 10/17/2017   Procedure: EXAM UNDER ANESTHESIA WITH HEMORRHOIDECTOMY AND HEMORRHOIDAL LIGATION/PEXY TIMES TWO;  Surgeon: Michael Boston, MD;  Location: [facility];  Service: General;  Laterality: N/A;  . EXCISION SEBACEOUS CYST  10-18-2013   @[facility]    ON BACK  . FLAP CLOSURE LEFT CHEEK POST WLE  09-18-2012    @[facility]   . MOHS SURGERY  X2 2010   right ear  . WIDE LOCAL EXCISION LEFT CHEEK AND LEFT CERVICAL SENITNAL LYMPH NODE BX  09-07-2012    @[facility]     Family History  Problem Relation Age of Onset  . COPD Mother   . Emphysema Mother   .  Stroke Mother        X 2  . Heart disease Mother        CABG in her 54s  . Hyperlipidemia Mother   . Hypertension Mother   . Diabetes Mother        type 2  . Cancer Father        melanoma  . Stroke Father        38  . Hypertension Father   . Alcohol abuse Father   . Heart disease Father   . Diabetes Maternal Grandmother        type 2  . Heart disease Maternal Grandmother        CHF  . Heart attack Maternal Grandfather   . Heart disease Paternal Grandmother   . Heart disease Paternal Grandfather   . Colon cancer Neg Hx   . Colon polyps Neg Hx     Social History   Socioeconomic History  . Marital status: Married    Spouse name: Sharyn Lull  . Number of children: 2  . Years of education: Not on file  . Highest education level: Not on file  Occupational History  . Occupation: Designer, multimedia  . Financial resource strain: Not on file  . Food insecurity:    Worry: Not on file    Inability: Not on file  .  Transportation needs:    Medical: Not on file    Non-medical: Not on file  Tobacco Use  . Smoking status: Never Smoker  . Smokeless tobacco: Never Used  Substance and Sexual Activity  . Alcohol use: Yes    Alcohol/week: 0.0 standard drinks    Frequency: Never    Comment: OCCASIONAL  . Drug use: No  . Sexual activity: Not on file  Lifestyle  . Physical activity:    Days per week: Not on file    Minutes per session: Not on file  . Stress: Not on file  Relationships  . Social connections:    Talks on phone: Not on file    Gets together: Not on file    Attends religious service: Not on file    Active member of club or organization: Not on file    Attends meetings of clubs or organizations: Not on file    Relationship status: Not on file  . Intimate partner violence:    Fear of current or ex partner: Not on file    Emotionally abused: Not on file    Physically abused: Not on file    Forced sexual activity: Not on file  Other Topics Concern  . Not  on file  Social History Narrative  . Not on file    Outpatient Medications Prior to Visit  Medication Sig Dispense Refill  . aspirin EC 81 MG tablet Take 1 tablet (81 mg total) by mouth daily. 90 tablet 3  . buPROPion (WELLBUTRIN XL) 300 MG 24 hr tablet Take 1 tablet (300 mg total) by mouth daily. 90 tablet 1  . cetirizine (ZYRTEC) 10 MG tablet Take 1 tablet (10 mg total) by mouth at bedtime as needed for allergies. 30 tablet 11  . cyclobenzaprine (FLEXERIL) 5 MG tablet Take 1 tablet (5 mg total) by mouth 3 (three) times daily as needed for muscle spasms. 21 tablet 0  . famotidine (PEPCID) 40 MG tablet Take 1 tablet (40 mg total) by mouth daily. 90 tablet 1  . FLUoxetine (PROZAC) 20 MG capsule Take 1 capsule (20 mg total) by mouth daily. 90 capsule 1  . fluticasone (FLONASE) 50 MCG/ACT nasal spray Place 2 sprays into both nostrils daily as needed. 16 g 3  . diclofenac (VOLTAREN) 75 MG EC tablet Take 1 tablet (75 mg total) by mouth 2 (two) times daily. 30 tablet 2  . ezetimibe (ZETIA) 10 MG tablet Take 1 tablet (10 mg total) by mouth daily. 30 tablet 3  . guaiFENesin (MUCINEX) 600 MG 12 hr tablet Take 1 tablet (600 mg total) by mouth 2 (two) times daily as needed. 20 tablet 0  . ciprofloxacin (CIPRO) 500 MG tablet Take 1 tablet (500 mg total) by mouth 2 (two) times daily. (Patient not taking: Reported on 06/08/2018) 20 tablet 0   No facility-administered medications prior to visit.     Allergies  Allergen Reactions  . Crestor [Rosuvastatin] Other (See Comments)    myalgias    Review of Systems  Constitutional: Positive for malaise/fatigue. Negative for fever.  HENT: Negative for congestion.   Eyes: Negative for blurred vision.  Respiratory: Negative for shortness of breath.   Cardiovascular: Negative for chest pain, palpitations and leg swelling.  Gastrointestinal: Negative for abdominal pain, blood in stool and nausea.  Genitourinary: Negative for dysuria and frequency.   Musculoskeletal: Negative for falls.  Skin: Negative for rash.  Neurological: Negative for dizziness, loss of consciousness and headaches.  Endo/Heme/Allergies: Negative for environmental allergies.  Psychiatric/Behavioral: Positive for depression. The patient is nervous/anxious.        Objective:    Physical Exam Vitals signs and nursing note reviewed.  Constitutional:      General: He is not in acute distress.    Appearance: He is well-developed.  HENT:     Head: Normocephalic and atraumatic.     Nose: Nose normal.  Eyes:     General:        Right eye: No discharge.        Left eye: No discharge.  Neck:     Musculoskeletal: Normal range of motion and neck supple.  Cardiovascular:     Rate and Rhythm: Normal rate and regular rhythm.     Heart sounds: No murmur.  Pulmonary:     Effort: Pulmonary effort is normal.     Breath sounds: Normal breath sounds.  Abdominal:     General: Bowel sounds are normal.     Palpations: Abdomen is soft.     Tenderness: There is no abdominal tenderness.  Musculoskeletal:     Comments: Left arm excision site healing well.   Skin:    General: Skin is warm and dry.  Neurological:     Mental Status: He is alert and oriented to person, place, and time.     BP 126/68 (BP Location: Right Arm, Patient Position: Sitting, Cuff Size: Normal)   Pulse 78   Temp 97.8 F (36.6 C) (Oral)   Resp 16   Ht 5\' 9"  (1.753 m)   Wt 168 lb 2 oz (76.3 kg)   SpO2 98%   BMI 24.83 kg/m  Wt Readings from Last 3 Encounters:  06/08/18 168 lb 2 oz (76.3 kg)  06/02/18 168 lb 9.6 oz (76.5 kg)  04/14/18 168 lb 1.9 oz (76.3 kg)     Lab Results  Component Value Date   WBC 13.0 (H) 04/07/2018   HGB 15.7 04/07/2018   HCT 47.6 04/07/2018   PLT 342 04/07/2018   GLUCOSE 130 (H) 04/13/2018   CHOL 255 (H) 04/07/2018   TRIG 187.0 (H) 04/07/2018   HDL 35.60 (L) 04/07/2018   LDLDIRECT 130.2 04/04/2014   LDLCALC 182 (H) 04/07/2018   ALT 18 04/13/2018   AST 15  04/13/2018   NA 140 04/13/2018   K 5.1 04/13/2018   CL 102 04/13/2018   CREATININE 1.12 04/13/2018   BUN 15 04/13/2018   CO2 30 04/13/2018   TSH 3.59 04/07/2018   PSA 0.48 04/13/2015    Lab Results  Component Value Date   TSH 3.59 04/07/2018   Lab Results  Component Value Date   WBC 13.0 (H) 04/07/2018   HGB 15.7 04/07/2018   HCT 47.6 04/07/2018   MCV 95.6 04/07/2018   PLT 342 04/07/2018   Lab Results  Component Value Date   NA 140 04/13/2018   K 5.1 04/13/2018   CO2 30 04/13/2018   GLUCOSE 130 (H) 04/13/2018   BUN 15 04/13/2018   CREATININE 1.12 04/13/2018   BILITOT 0.4 04/13/2018   ALKPHOS 75 04/13/2018   AST 15 04/13/2018   ALT 18 04/13/2018   PROT 6.7 04/13/2018   PROT 6.8 04/13/2018   ALBUMIN 4.6 04/13/2018   CALCIUM 9.7 04/13/2018   GFR 68.52 04/13/2018   Lab Results  Component Value Date   CHOL 255 (H) 04/07/2018   Lab Results  Component Value Date   HDL 35.60 (L) 04/07/2018   Lab Results  Component Value Date   LDLCALC 182 (H)  04/07/2018   Lab Results  Component Value Date   TRIG 187.0 (H) 04/07/2018   Lab Results  Component Value Date   CHOLHDL 7 04/07/2018   No results found for: HGBA1C     Assessment & Plan:   Problem List Items Addressed This Visit    Anxiety and depression    Doing well on current meds, no changes      Hyperlipidemia    Encouraged heart healthy diet, increase exercise, avoid trans fats, consider a krill oil cap daily. Statin intolerant is now going to be at the lipid clinic for medications.       Chest pain    Coronary calcium score at 90 % so he is now following with cardiology.       Sun-damaged skin    Just had a LUE SCC removed recently and has done well. Has had some mild swelling in that hand but it is improving and no pain associated.       Other Visit Diagnoses    Cough    -  Primary   Relevant Orders   Ambulatory referral to ENT   DG Chest 2 View   CBC w/Diff   Hyperglycemia        Relevant Orders   Hemoglobin A1c   Comprehensive metabolic panel      I have discontinued Grace Bushy. Csaszar "Steve"'s ezetimibe, diclofenac, ciprofloxacin, and guaiFENesin. I am also having him start on naproxen. Additionally, I am having him maintain his buPROPion, cyclobenzaprine, fluticasone, FLUoxetine, famotidine, cetirizine, and aspirin EC.  Meds ordered this encounter  Medications  . naproxen (NAPROSYN) 375 MG tablet    Sig: Take 1 tablet (375 mg total) by mouth 2 (two) times daily as needed.    Dispense:  60 tablet    Refill:  2     Penni Homans, MD

## 2018-06-08 NOTE — Progress Notes (Signed)
Pre visit review using our clinic review tool, if applicable. No additional management support is needed unless otherwise documented below in the visit note. 

## 2018-06-09 ENCOUNTER — Telehealth: Payer: Self-pay | Admitting: Pharmacist

## 2018-06-09 LAB — COMPREHENSIVE METABOLIC PANEL
ALK PHOS: 79 U/L (ref 39–117)
ALT: 19 U/L (ref 0–53)
AST: 15 U/L (ref 0–37)
Albumin: 4.6 g/dL (ref 3.5–5.2)
BUN: 16 mg/dL (ref 6–23)
CO2: 30 meq/L (ref 19–32)
Calcium: 9.5 mg/dL (ref 8.4–10.5)
Chloride: 102 mEq/L (ref 96–112)
Creatinine, Ser: 1.17 mg/dL (ref 0.40–1.50)
GFR: 65.12 mL/min (ref >60.00)
Glucose, Bld: 89 mg/dL (ref 70–99)
Potassium: 5.1 mEq/L (ref 3.5–5.1)
Sodium: 137 mEq/L (ref 135–145)
Total Bilirubin: 0.3 mg/dL (ref 0.2–1.2)
Total Protein: 6.7 g/dL (ref 6.0–8.3)

## 2018-06-09 LAB — CBC WITH DIFFERENTIAL/PLATELET
Basophils Absolute: 0.1 10*3/uL (ref 0.0–0.1)
Basophils Relative: 0.8 % (ref 0.0–3.0)
Eosinophils Absolute: 0.2 10*3/uL (ref 0.0–0.7)
Eosinophils Relative: 1.6 % (ref 0.0–5.0)
HCT: 43.2 % (ref 39.0–52.0)
Hemoglobin: 14.6 g/dL (ref 13.0–17.0)
LYMPHS ABS: 3.4 10*3/uL (ref 0.7–4.0)
LYMPHS PCT: 34.1 % (ref 12.0–46.0)
MCHC: 33.7 g/dL (ref 30.0–36.0)
MCV: 94.6 fl (ref 78.0–100.0)
Monocytes Absolute: 0.5 10*3/uL (ref 0.1–1.0)
Monocytes Relative: 5.4 % (ref 3.0–12.0)
NEUTROS ABS: 5.8 10*3/uL (ref 1.4–7.7)
NEUTROS PCT: 58.1 % (ref 43.0–77.0)
Platelets: 300 10*3/uL (ref 150.0–400.0)
RBC: 4.56 Mil/uL (ref 4.22–5.81)
RDW: 14.8 % (ref 11.5–15.5)
WBC: 10 10*3/uL (ref 4.0–10.5)

## 2018-06-09 LAB — HEMOGLOBIN A1C: Hgb A1c MFr Bld: 5.7 % (ref 4.6–6.5)

## 2018-06-09 MED ORDER — EVOLOCUMAB 140 MG/ML ~~LOC~~ SOAJ: 140.0000 mg | SUBCUTANEOUS | 11 refills | Status: DC

## 2018-06-09 NOTE — Telephone Encounter (Signed)
Called the pt to say that the rx was sent for repatha they stated that it was going to cost $114 want a patient assistance form mailed

## 2018-06-09 NOTE — Telephone Encounter (Signed)
PA not required per insurance.   "This medication or product is on your plan's list of covered drugs. Prior authorization is not required at this time. If your pharmacy has questions regarding the processing of your prescription, please have them call the [facility] pharmacy help desk at [phone]."   Rx sent to prefer pharmacy

## 2018-06-11 ENCOUNTER — Encounter: Payer: Self-pay | Admitting: Family Medicine

## 2018-06-11 ENCOUNTER — Ambulatory Visit: Payer: Medicare Other

## 2018-06-24 ENCOUNTER — Telehealth: Payer: Self-pay

## 2018-06-24 NOTE — Telephone Encounter (Signed)
Called the patient and left a detailed msg to let them know that the safety [employer] foundation denied them from receiving the medication free from the manufacturer. Also stated for the pt to call the [employer] to see if he would qualify for help them and grant them money towards getting their repatha from the pharmacy

## 2018-06-30 ENCOUNTER — Telehealth: Payer: Self-pay | Admitting: Cardiology

## 2018-06-30 NOTE — Telephone Encounter (Signed)
New Message   Pt wife I calling because the financial assistance on the medication was not approved. She is wondering what the next step will be   Please call back

## 2018-07-02 NOTE — Telephone Encounter (Signed)
Patient's wife returned phone call for [facility]. Pt has commercial insurance so he qualifies for $5 Repatha copay card. [employer] will not cover patients with commercial insurance. They spoke with Repatha Ready and received a copay card.

## 2018-09-02 ENCOUNTER — Encounter: Payer: Self-pay | Admitting: Family Medicine

## 2018-09-02 ENCOUNTER — Telehealth: Payer: Self-pay | Admitting: Family Medicine

## 2018-09-02 NOTE — Telephone Encounter (Signed)
Spoke with pt to cancel pt appt for 09-10-2018, pt informed is needing an appt sooner with provider since pt is needing labs to be done requested by his Dermatologist but needed to be done with pt's primary Doc and also to consult with provider about treatment. Pt stated would like to be seen if possible on this Friday 09-04-2018 at 11:30 if possible. Please advise.

## 2018-09-02 NOTE — Telephone Encounter (Signed)
Thanks I see he has an appt Friday that is fine.

## 2018-09-02 NOTE — Telephone Encounter (Signed)
I spoke with Dr. Lynann Bologna office and they stated there was no lab work needed as long as he was not on Coumadin or warfarin he was ok.  Please advise

## 2018-09-02 NOTE — Telephone Encounter (Signed)
Dr. Charlett Blake: I spoke with the dermatologist office they did not state there was lab work that needed to be done. If the patient was not on coumadin or warfarin then he was ok to have the procedure done.  There is a Mychart on this as well

## 2018-09-02 NOTE — Telephone Encounter (Signed)
Spoke with pt, pt had an appt on 09-10-2018 but provider is on vacation, pt stated is needing an appt sooner since pt is needing labs to be  Copied from [place] [phone]. Topic: Appointment Scheduling - Scheduling Inquiry for Clinic >> Sep 01, 2018  4:37 PM Percell Belt A wrote: Reason for CRM: pt wife called in and would like to sch pt for CPE.  It been way over a year since he had one.    Best number  [phone] >> Sep 02, 2018  8:37 AM Damita Dunnings, CMA wrote: Please schedule appt at Pt's convenience.

## 2018-09-03 ENCOUNTER — Telehealth: Payer: Self-pay | Admitting: *Deleted

## 2018-09-03 NOTE — Telephone Encounter (Signed)
Copied from [place] [phone]. Topic: General - Other >> Sep 03, 2018 11:50 AM Ivar Drape wrote: Reason for CRM:   Patient has made an appt with his Oncologist, so he needs to cancel his appt for tomorrow 09/04/2018 and he will call back to reschedule.

## 2018-09-04 ENCOUNTER — Ambulatory Visit: Payer: BLUE CROSS/BLUE SHIELD | Admitting: Family Medicine

## 2018-09-10 ENCOUNTER — Ambulatory Visit: Payer: Medicare Other | Admitting: Family Medicine

## 2018-10-10 ENCOUNTER — Other Ambulatory Visit: Payer: Self-pay | Admitting: Family Medicine

## 2018-11-07 ENCOUNTER — Other Ambulatory Visit: Payer: Self-pay | Admitting: Family Medicine

## 2018-11-07 DIAGNOSIS — E785 Hyperlipidemia, unspecified: Secondary | ICD-10-CM

## 2018-11-07 DIAGNOSIS — K649 Unspecified hemorrhoids: Secondary | ICD-10-CM

## 2018-11-07 DIAGNOSIS — F32A Depression, unspecified: Secondary | ICD-10-CM

## 2018-11-07 DIAGNOSIS — S0101XA Laceration without foreign body of scalp, initial encounter: Secondary | ICD-10-CM

## 2018-11-07 DIAGNOSIS — F418 Other specified anxiety disorders: Secondary | ICD-10-CM

## 2018-11-07 DIAGNOSIS — M545 Low back pain, unspecified: Secondary | ICD-10-CM

## 2018-11-07 DIAGNOSIS — F329 Major depressive disorder, single episode, unspecified: Secondary | ICD-10-CM

## 2018-11-07 DIAGNOSIS — W57XXXA Bitten or stung by nonvenomous insect and other nonvenomous arthropods, initial encounter: Secondary | ICD-10-CM

## 2018-11-07 DIAGNOSIS — D72829 Elevated white blood cell count, unspecified: Secondary | ICD-10-CM

## 2018-11-16 ENCOUNTER — Ambulatory Visit: Payer: BC Managed Care – PPO | Admitting: Family Medicine

## 2018-12-22 ENCOUNTER — Ambulatory Visit (INDEPENDENT_AMBULATORY_CARE_PROVIDER_SITE_OTHER): Payer: BC Managed Care – PPO | Admitting: Family Medicine

## 2018-12-22 ENCOUNTER — Ambulatory Visit (HOSPITAL_BASED_OUTPATIENT_CLINIC_OR_DEPARTMENT_OTHER)
Admission: RE | Admit: 2018-12-22 | Discharge: 2018-12-22 | Disposition: A | Discharge status: Home / Self Care | Payer: BC Managed Care – PPO | Source: Ambulatory Visit | Attending: Family Medicine | Admitting: Family Medicine

## 2018-12-22 ENCOUNTER — Encounter: Payer: Self-pay | Admitting: Family Medicine

## 2018-12-22 ENCOUNTER — Other Ambulatory Visit: Payer: Self-pay

## 2018-12-22 VITALS — BP 122/82 | HR 81 | Temp 97.9°F | Ht 69.0 in | Wt 163.0 lb

## 2018-12-22 DIAGNOSIS — M79672 Pain in left foot: Secondary | ICD-10-CM | POA: Insufficient documentation

## 2018-12-22 NOTE — Patient Instructions (Signed)
Ice/cold pack over area for 10-15 min twice daily.  OK to take Tylenol 1000 mg (2 extra strength tabs) or 975 mg (3 regular strength tabs) every 6 hours as needed.  Ibuprofen 400-600 mg (2-3 over the counter strength tabs) every 6 hours as needed for pain.  Ankle Exercises It is normal to feel mild stretching, pulling, tightness, or discomfort as you do these exercises, but you should stop right away if you feel sudden pain or your pain gets worse.  Stretching and range of motion exercises These exercises warm up your muscles and joints and improve the movement and flexibility of your ankle. These exercises also help to relieve pain, numbness, and tingling. Exercise A: Dorsiflexion/Plantar Flexion   1. Sit with your affected knee straight or bent. Do not rest your foot on anything. 2. Flex your affected ankle to tilt the top of your foot toward your shin. 3. Hold this position for 5 seconds. 4. Point your toes downward to tilt the top of your foot away from your shin. 5. Hold this position for 5 seconds. Repeat 2 times. Complete this exercise 3 times per week. Exercise B: Ankle Alphabet   1. Sit with your affected foot supported at your lower leg. ? Do not rest your foot on anything. ? Make sure your foot has room to move freely. 2. Think of your affected foot as a paintbrush, and move your foot to trace each letter of the alphabet in the air. Keep your hip and knee still while you trace. Make the letters as large as you can without increasing any discomfort. 3. Trace every letter from A to Z. Repeat 2 times. Complete this exercise 3 times per week. Strengthening exercises These exercises build strength and endurance in your ankle. Endurance is the ability to use your muscles for a long time, even after they get tired. Exercise D: Dorsiflexors   1. Secure a rubber exercise band or tube to an object, such as a table leg, that will stay still when the band is pulled. Secure the  other end around your affected foot. 2. Sit on the floor, facing the object with your affected leg extended. The band or tube should be slightly tense when your foot is relaxed. 3. Slowly flex your affected ankle and toes to bring your foot toward you. 4. Hold this position for 3 seconds.  5. Slowly return your foot to the starting position, controlling the band as you do that. Do a total of 10 repetitions. Repeat 2 times. Complete this exercise 3 times per week. Exercise E: Plantar Flexors   1. Sit on the floor with your affected leg extended. 2. Loop a rubber exercise band or tube around the ball of your affected foot. The ball of your foot is on the walking surface, right under your toes. The band or tube should be slightly tense when your foot is relaxed. 3. Slowly point your toes downward, pushing them away from you. 4. Hold this position for 3 seconds. 5. Slowly release the tension in the band or tube, controlling smoothly until your foot is back in the starting position. Repeat for a total of 10 repetitions. Repeat 2 times. Complete this exercise 3 times per week. Exercise F: Towel Curls   1. Sit in a chair on a non-carpeted surface, and put your feet on the floor. 2. Place a towel in front of your feet.  3. Keeping your heel on the floor, put your affected foot on the towel. 4.  Pull the towel toward you by grabbing the towel with your toes and curling them under. Keep your heel on the floor. 5. Let your toes relax. 6. Grab the towel again. Keep going until the towel is completely underneath your foot. Repeat for a total of 10 repetitions. Repeat 2 times. Complete this exercise 3 times per week. Exercise G: Heel Raise ( Plantar Flexors, Standing)    1. Stand with your feet shoulder-width apart. 2. Keep your weight spread evenly over the width of your feet while you rise up on your toes. Use a wall or table to steady yourself, but try not to use it for support. 3. If this  exercise is too easy, try these options: ? Shift your weight toward your affected leg until you feel challenged. ? If told by your health care provider, lift your uninjured leg off the floor. 4. Hold this position for 3 seconds. Repeat for a total of 10 repetitions. Repeat 2 times. Complete this exercise 3 times per week. Exercise H: Tandem Walking 1. Stand with one foot directly in front of the other. 2. Slowly raise your back foot up, lifting your heel before your toes, and place it directly in front of your other foot. 3. Continue to walk in this heel-to-toe way for 10 steps or for as long as told by your health care provider. Have a countertop or wall nearby to use if needed to keep your balance, but try not to hold onto anything for support. Repeat 2 times. Complete this exercises 3 times per week. Make sure you discuss any questions you have with your health care provider. Document Released: 01/23/2005 Document Revised: 11/09/2015 Document Reviewed: 11/27/2014 Elsevier Interactive Patient Education  2018 [employer].

## 2018-12-22 NOTE — Progress Notes (Signed)
Musculoskeletal Exam  Patient: Aaron Dyer DOB: Nov 01, 1965  DOS: 12/22/2018  SUBJECTIVE:  Chief Complaint:   Chief Complaint  Patient presents with  . left foot pain    Aaron Dyer is a 53 y.o.  male for evaluation and treatment of L foot pain.   Onset:  1 day ago. No inj or change in activity. Did wear cowboy boots that he doesn't normally wear.  Location: L lateral foot Character:  aching  Progression of issue:  is unchanged Associated symptoms: Pain with walking Treatment: to date has been: none   Neurovascular symptoms: no  ROS: Musculoskeletal/Extremities: +foot pain  Past Medical History:  Diagnosis Date  . Anxiety and depression   . Arthritis    neck  . Diverticulosis of colon without hemorrhage 12/18/2014  . ED (erectile dysfunction)   . GERD (gastroesophageal reflux disease)   . History of adenomatous polyp of colon   . History of adenomatous polyp of colon   . History of exercise stress test 06/09/2006   ECHO ETT-- NORMAL  . History of melanoma excision    nonmalignant and malignant   . History of nonmelanoma skin cancer dermatologist-  dr williford   multiple areas on body,  SCC majority  . Hyperlipidemia   . IBS (irritable bowel syndrome)   . Prolapsed internal hemorrhoids, grade 4     Objective: VITAL SIGNS: BP (!) 142/88 (BP Location: Left Arm, Patient Position: Sitting, Cuff Size: Normal)   Pulse 81   Temp 97.9 F (36.6 C) (Temporal)   Ht 5\' 9"  (1.753 m)   Wt 163 lb (73.9 kg)   SpO2 98%   BMI 24.07 kg/m  Constitutional: Well formed, well developed. No acute distress. Cardiovascular: Brisk cap refill Thorax & Lungs: No accessory muscle use Musculoskeletal: L foot.   Normal active range of motion: yes.   Normal passive range of motion: yes Tenderness to palpation: yes over dorsum of foot by base of 5th MT Deformity: no Ecchymosis: no Neurologic: Normal sensory function. Psychiatric: Normal mood. Age appropriate judgment and  insight. Alert & oriented x 3.    Assessment:  Left foot pain - Plan: DG Foot Complete Left  Plan: Tylenol, ice, NSAIDs, some stretches should the peroneal msc groups be involved.  Await official x-ray read.  A flat shoe was given today. F/u prn. The patient voiced understanding and agreement to the plan.   [place], DO 12/22/18  11:31 AM

## 2018-12-30 ENCOUNTER — Other Ambulatory Visit: Payer: Self-pay | Admitting: Family Medicine

## 2019-01-08 ENCOUNTER — Other Ambulatory Visit: Payer: Self-pay | Admitting: Family Medicine

## 2019-01-27 ENCOUNTER — Other Ambulatory Visit: Payer: Self-pay | Admitting: *Deleted

## 2019-01-27 DIAGNOSIS — Z20822 Contact with and (suspected) exposure to covid-19: Secondary | ICD-10-CM

## 2019-01-28 LAB — NOVEL CORONAVIRUS, NAA: SARS-CoV-2, NAA: NOT DETECTED

## 2019-02-07 ENCOUNTER — Other Ambulatory Visit: Payer: Self-pay | Admitting: Family Medicine

## 2019-02-07 DIAGNOSIS — E785 Hyperlipidemia, unspecified: Secondary | ICD-10-CM

## 2019-02-07 DIAGNOSIS — S0101XA Laceration without foreign body of scalp, initial encounter: Secondary | ICD-10-CM

## 2019-02-07 DIAGNOSIS — M545 Low back pain, unspecified: Secondary | ICD-10-CM

## 2019-02-07 DIAGNOSIS — K649 Unspecified hemorrhoids: Secondary | ICD-10-CM

## 2019-02-07 DIAGNOSIS — F32A Depression, unspecified: Secondary | ICD-10-CM

## 2019-02-07 DIAGNOSIS — F418 Other specified anxiety disorders: Secondary | ICD-10-CM

## 2019-02-07 DIAGNOSIS — F329 Major depressive disorder, single episode, unspecified: Secondary | ICD-10-CM

## 2019-02-07 DIAGNOSIS — D72829 Elevated white blood cell count, unspecified: Secondary | ICD-10-CM

## 2019-02-07 DIAGNOSIS — W57XXXA Bitten or stung by nonvenomous insect and other nonvenomous arthropods, initial encounter: Secondary | ICD-10-CM

## 2019-03-12 ENCOUNTER — Other Ambulatory Visit: Payer: Self-pay | Admitting: Family Medicine

## 2019-03-12 NOTE — Telephone Encounter (Signed)
Last OV 12/22/18 Last refill 12/30/18 #90/0 Next OV not scheduled

## 2019-03-23 ENCOUNTER — Encounter: Payer: Self-pay | Admitting: Family Medicine

## 2019-03-24 ENCOUNTER — Other Ambulatory Visit: Payer: Self-pay

## 2019-03-24 ENCOUNTER — Ambulatory Visit (INDEPENDENT_AMBULATORY_CARE_PROVIDER_SITE_OTHER): Payer: BC Managed Care – PPO | Admitting: Family Medicine

## 2019-03-24 ENCOUNTER — Encounter: Payer: Self-pay | Admitting: Family Medicine

## 2019-03-24 VITALS — BP 128/84 | HR 75 | Temp 97.7°F | Ht 69.0 in | Wt 167.0 lb

## 2019-03-24 DIAGNOSIS — I2583 Coronary atherosclerosis due to lipid rich plaque: Secondary | ICD-10-CM

## 2019-03-24 DIAGNOSIS — M79642 Pain in left hand: Secondary | ICD-10-CM

## 2019-03-24 DIAGNOSIS — R109 Unspecified abdominal pain: Secondary | ICD-10-CM

## 2019-03-24 DIAGNOSIS — M79641 Pain in right hand: Secondary | ICD-10-CM | POA: Diagnosis not present

## 2019-03-24 DIAGNOSIS — I2510 Atherosclerotic heart disease of native coronary artery without angina pectoris: Secondary | ICD-10-CM

## 2019-03-24 LAB — URINALYSIS
Bilirubin Urine: NEGATIVE
Ketones, ur: NEGATIVE
Leukocytes,Ua: NEGATIVE
Nitrite: NEGATIVE
Specific Gravity, Urine: 1.025 (ref 1.000–1.030)
Total Protein, Urine: NEGATIVE
Urine Glucose: NEGATIVE
Urobilinogen, UA: 0.2 (ref 0.0–1.0)
pH: 5.5 (ref 5.0–8.0)

## 2019-03-24 NOTE — Progress Notes (Signed)
Musculoskeletal Exam  Patient: Aaron Dyer DOB: [birth year]  DOS: 03/24/2019  SUBJECTIVE:  Chief Complaint:   Chief Complaint  Patient presents with  . Flank Pain    right    Aaron Dyer is a 53 y.o.  male for evaluation and treatment of R flank pain.   Onset:  1 month ago. No inj or change in activity.  Location: R flank Character:  aching  Progression of issue:  has improved Associated symptoms: no swelling, redness, bruising Treatment: to date has been Aspercreme, Biofreeze, heat.   Neurovascular symptoms: no No personal history of kidney stones.  B/l hand pain for several weeks.  No injury or change in activity.  He does have a family history of arthritis in the hands with both of his parents.  No swelling, redness, or bruising.  He has not tried anything for his hands thus far.  He will start to drop things in addition to having tingling in his hands, worse at night.  When he shakes them out it feels better.  He has never been diagnosed with carpal tunnel syndrome before.  ROS: Musculoskeletal/Extremities: +flank/hand pain GU: No blood/pain  Past Medical History:  Diagnosis Date  . Anxiety and depression   . Arthritis    neck  . Diverticulosis of colon without hemorrhage 12/18/2014  . ED (erectile dysfunction)   . GERD (gastroesophageal reflux disease)   . History of adenomatous polyp of colon   . History of adenomatous polyp of colon   . History of exercise stress test 06/09/2006   ECHO ETT-- NORMAL  . History of melanoma excision    nonmalignant and malignant   . History of nonmelanoma skin cancer dermatologist-  dr williford   multiple areas on body,  SCC majority  . Hyperlipidemia   . IBS (irritable bowel syndrome)   . Prolapsed internal hemorrhoids, grade 4     Objective: VITAL SIGNS: BP 128/84 (BP Location: Left Arm, Patient Position: Sitting, Cuff Size: Normal)   Pulse 75   Temp 97.7 F (36.5 C) (Temporal)   Ht 5\' 9"  (1.753 m)   Wt 167 lb  (75.8 kg)   SpO2 98%   BMI 24.66 kg/m  Constitutional: Well formed, well developed. No acute distress. Cardiovascular: Brisk cap refill Thorax & Lungs: No accessory muscle use Musculoskeletal: back.   Tenderness to palpation: yes over flank area Deformity: no Ecchymosis: no Tests positive: none  Tests negative: Straight leg Hands: No tenderness to palpation over the hands, no swelling or excessive warmth, reasonable range of motion Neurologic: Normal sensory function. N negative Tinel's and Phalen's bilaterally for the carpal tunnel, no muscle group atrophy Psychiatric: Normal mood. Age appropriate judgment and insight. Alert & oriented x 3.    Assessment:  Flank pain - Plan: Urinalysis  Bilateral hand pain  Plan: 1- Ck urine. If neg, will tx for msk issue, heat, Tylenol, stretches/exercises. If + for blood, stone and if suggestive of infection, pyelo. 2- consider OA, Voltaren gel. Consider CTS, wrist splints given today. F/u in 1 mo prn. The patient voiced understanding and agreement to the plan.   [place], DO 03/24/19  2:25 PM

## 2019-03-24 NOTE — Patient Instructions (Addendum)
Wear the splints at night.  Heat (pad or rice pillow in microwave) over affected area, 10-15 minutes twice daily.   Ice/cold pack over area for 10-15 min twice daily.  OK to take Tylenol 1000 mg (2 extra strength tabs) or 975 mg (3 regular strength tabs) every 6 hours as needed.  Consider Voltaren Gel 1% four times daily as needed for your hands. This is over the counter.   Keep moving your hands.  EXERCISES  RANGE OF MOTION (ROM) AND STRETCHING EXERCISES - Low Back Pain Most people with lower back pain will find that their symptoms get worse with excessive bending forward (flexion) or arching at the lower back (extension). The exercises that will help resolve your symptoms will focus on the opposite motion.  If you have pain, numbness or tingling which travels down into your buttocks, leg or foot, the goal of the therapy is for these symptoms to move closer to your back and eventually resolve. Sometimes, these leg symptoms will get better, but your lower back pain may worsen. This is often an indication of progress in your rehabilitation. Be very alert to any changes in your symptoms and the activities in which you participated in the 24 hours prior to the change. Sharing this information with your caregiver will allow him or her to most efficiently treat your condition. These exercises may help you when beginning to rehabilitate your injury. Your symptoms may resolve with or without further involvement from your physician, physical therapist or athletic trainer. While completing these exercises, remember:   Restoring tissue flexibility helps normal motion to return to the joints. This allows healthier, less painful movement and activity.  An effective stretch should be held for at least 30 seconds.  A stretch should never be painful. You should only feel a gentle lengthening or release in the stretched tissue. FLEXION RANGE OF MOTION AND STRETCHING EXERCISES:  STRETCH - Flexion, Single  Knee to Chest   Lie on a firm bed or floor with both legs extended in front of you.  Keeping one leg in contact with the floor, bring your opposite knee to your chest. Hold your leg in place by either grabbing behind your thigh or at your knee.  Pull until you feel a gentle stretch in your low back. Hold 30 seconds.  Slowly release your grasp and repeat the exercise with the opposite side. Repeat 2 times. Complete this exercise 3 times per week.   STRETCH - Flexion, Double Knee to Chest  Lie on a firm bed or floor with both legs extended in front of you.  Keeping one leg in contact with the floor, bring your opposite knee to your chest.  Tense your stomach muscles to support your back and then lift your other knee to your chest. Hold your legs in place by either grabbing behind your thighs or at your knees.  Pull both knees toward your chest until you feel a gentle stretch in your low back. Hold 30 seconds.  Tense your stomach muscles and slowly return one leg at a time to the floor. Repeat 2 times. Complete this exercise 3 times per week.   STRETCH - Low Trunk Rotation  Lie on a firm bed or floor. Keeping your legs in front of you, bend your knees so they are both pointed toward the ceiling and your feet are flat on the floor.  Extend your arms out to the side. This will stabilize your upper body by keeping your shoulders in contact  with the floor.  Gently and slowly drop both knees together to one side until you feel a gentle stretch in your low back. Hold for 30 seconds.  Tense your stomach muscles to support your lower back as you bring your knees back to the starting position. Repeat the exercise to the other side. Repeat 2 times. Complete this exercise at least 3 times per week.   EXTENSION RANGE OF MOTION AND FLEXIBILITY EXERCISES:  STRETCH - Extension, Prone on Elbows   Lie on your stomach on the floor, a bed will be too soft. Place your palms about shoulder width apart  and at the height of your head.  Place your elbows under your shoulders. If this is too painful, stack pillows under your chest.  Allow your body to relax so that your hips drop lower and make contact more completely with the floor.  Hold this position for 30 seconds.  Slowly return to lying flat on the floor. Repeat 2 times. Complete this exercise 3 times per week.   RANGE OF MOTION - Extension, Prone Press Ups  Lie on your stomach on the floor, a bed will be too soft. Place your palms about shoulder width apart and at the height of your head.  Keeping your back as relaxed as possible, slowly straighten your elbows while keeping your hips on the floor. You may adjust the placement of your hands to maximize your comfort. As you gain motion, your hands will come more underneath your shoulders.  Hold this position 30 seconds.  Slowly return to lying flat on the floor. Repeat 2 times. Complete this exercise 3 times per week.   RANGE OF MOTION- Quadruped, Neutral Spine   Assume a hands and knees position on a firm surface. Keep your hands under your shoulders and your knees under your hips. You may place padding under your knees for comfort.  Drop your head and point your tailbone toward the ground below you. This will round out your lower back like an angry cat. Hold this position for 30 seconds.  Slowly lift your head and release your tail bone so that your back sags into a large arch, like an old horse.  Hold this position for 30 seconds.  Repeat this until you feel limber in your low back.  Now, find your "sweet spot." This will be the most comfortable position somewhere between the two previous positions. This is your neutral spine. Once you have found this position, tense your stomach muscles to support your low back.  Hold this position for 30 seconds. Repeat 2 times. Complete this exercise 3 times per week.   STRENGTHENING EXERCISES - Low Back Sprain These exercises may  help you when beginning to rehabilitate your injury. These exercises should be done near your "sweet spot." This is the neutral, low-back arch, somewhere between fully rounded and fully arched, that is your least painful position. When performed in this safe range of motion, these exercises can be used for people who have either a flexion or extension based injury. These exercises may resolve your symptoms with or without further involvement from your physician, physical therapist or athletic trainer. While completing these exercises, remember:   Muscles can gain both the endurance and the strength needed for everyday activities through controlled exercises.  Complete these exercises as instructed by your physician, physical therapist or athletic trainer. Increase the resistance and repetitions only as guided.  You may experience muscle soreness or fatigue, but the pain or discomfort you  are trying to eliminate should never worsen during these exercises. If this pain does worsen, stop and make certain you are following the directions exactly. If the pain is still present after adjustments, discontinue the exercise until you can discuss the trouble with your caregiver.  STRENGTHENING - Deep Abdominals, Pelvic Tilt   Lie on a firm bed or floor. Keeping your legs in front of you, bend your knees so they are both pointed toward the ceiling and your feet are flat on the floor.  Tense your lower abdominal muscles to press your low back into the floor. This motion will rotate your pelvis so that your tail bone is scooping upwards rather than pointing at your feet or into the floor. With a gentle tension and even breathing, hold this position for 3 seconds. Repeat 2 times. Complete this exercise 3 times per week.   STRENGTHENING - Abdominals, Crunches   Lie on a firm bed or floor. Keeping your legs in front of you, bend your knees so they are both pointed toward the ceiling and your feet are flat on the  floor. Cross your arms over your chest.  Slightly tip your chin down without bending your neck.  Tense your abdominals and slowly lift your trunk high enough to just clear your shoulder blades. Lifting higher can put excessive stress on the lower back and does not further strengthen your abdominal muscles.  Control your return to the starting position. Repeat 2 times. Complete this exercise 3 times per week.   STRENGTHENING - Quadruped, Opposite UE/LE Lift   Assume a hands and knees position on a firm surface. Keep your hands under your shoulders and your knees under your hips. You may place padding under your knees for comfort.  Find your neutral spine and gently tense your abdominal muscles so that you can maintain this position. Your shoulders and hips should form a rectangle that is parallel with the floor and is not twisted.  Keeping your trunk steady, lift your right hand no higher than your shoulder and then your left leg no higher than your hip. Make sure you are not holding your breath. Hold this position for 30 seconds.  Continuing to keep your abdominal muscles tense and your back steady, slowly return to your starting position. Repeat with the opposite arm and leg. Repeat 2 times. Complete this exercise 3 times per week.   STRENGTHENING - Abdominals and Quadriceps, Straight Leg Raise   Lie on a firm bed or floor with both legs extended in front of you.  Keeping one leg in contact with the floor, bend the other knee so that your foot can rest flat on the floor.  Find your neutral spine, and tense your abdominal muscles to maintain your spinal position throughout the exercise.  Slowly lift your straight leg off the floor about 6 inches for a count of 3, making sure to not hold your breath.  Still keeping your neutral spine, slowly lower your leg all the way to the floor. Repeat this exercise with each leg 2 times. Complete this exercise 3 times per week.  POSTURE AND BODY  MECHANICS CONSIDERATIONS - Low Back Sprain Keeping correct posture when sitting, standing or completing your activities will reduce the stress put on different body tissues, allowing injured tissues a chance to heal and limiting painful experiences. The following are general guidelines for improved posture.  While reading these guidelines, remember:  The exercises prescribed by your provider will help you have the flexibility  and strength to maintain correct postures.  The correct posture provides the best environment for your joints to work. All of your joints have less wear and tear when properly supported by a spine with good posture. This means you will experience a healthier, less painful body.  Correct posture must be practiced with all of your activities, especially prolonged sitting and standing. Correct posture is as important when doing repetitive low-stress activities (typing) as it is when doing a single heavy-load activity (lifting).  RESTING POSITIONS Consider which positions are most painful for you when choosing a resting position. If you have pain with flexion-based activities (sitting, bending, stooping, squatting), choose a position that allows you to rest in a less flexed posture. You would want to avoid curling into a fetal position on your side. If your pain worsens with extension-based activities (prolonged standing, working overhead), avoid resting in an extended position such as sleeping on your stomach. Most people will find more comfort when they rest with their spine in a more neutral position, neither too rounded nor too arched. Lying on a non-sagging bed on your side with a pillow between your knees, or on your back with a pillow under your knees will often provide some relief. Keep in mind, being in any one position for a prolonged period of time, no matter how correct your posture, can still lead to stiffness.  PROPER SITTING POSTURE In order to minimize stress and  discomfort on your spine, you must sit with correct posture. Sitting with good posture should be effortless for a healthy body. Returning to good posture is a gradual process. Many people can work toward this most comfortably by using various supports until they have the flexibility and strength to maintain this posture on their own. When sitting with proper posture, your ears will fall over your shoulders and your shoulders will fall over your hips. You should use the back of the chair to support your upper back. Your lower back will be in a neutral position, just slightly arched. You may place a small pillow or folded towel at the base of your lower back for  support.  When working at a desk, create an environment that supports good, upright posture. Without extra support, muscles tire, which leads to excessive strain on joints and other tissues. Keep these recommendations in mind:  CHAIR:  A chair should be able to slide under your desk when your back makes contact with the back of the chair. This allows you to work closely.  The chair's height should allow your eyes to be level with the upper part of your monitor and your hands to be slightly lower than your elbows.  BODY POSITION  Your feet should make contact with the floor. If this is not possible, use a foot rest.  Keep your ears over your shoulders. This will reduce stress on your neck and low back.  INCORRECT SITTING POSTURES  If you are feeling tired and unable to assume a healthy sitting posture, do not slouch or slump. This puts excessive strain on your back tissues, causing more damage and pain. Healthier options include:  Using more support, like a lumbar pillow.  Switching tasks to something that requires you to be upright or walking.  Talking a brief walk.  Lying down to rest in a neutral-spine position.  PROLONGED STANDING WHILE SLIGHTLY LEANING FORWARD  When completing a task that requires you to lean forward while  standing in one place for a long time, place  either foot up on a stationary 2-4 inch high object to help maintain the best posture. When both feet are on the ground, the lower back tends to lose its slight inward curve. If this curve flattens (or becomes too large), then the back and your other joints will experience too much stress, tire more quickly, and can cause pain.  CORRECT STANDING POSTURES Proper standing posture should be assumed with all daily activities, even if they only take a few moments, like when brushing your teeth. As in sitting, your ears should fall over your shoulders and your shoulders should fall over your hips. You should keep a slight tension in your abdominal muscles to brace your spine. Your tailbone should point down to the ground, not behind your body, resulting in an over-extended swayback posture.   INCORRECT STANDING POSTURES  Common incorrect standing postures include a forward head, locked knees and/or an excessive swayback. WALKING Walk with an upright posture. Your ears, shoulders and hips should all line-up.  PROLONGED ACTIVITY IN A FLEXED POSITION When completing a task that requires you to bend forward at your waist or lean over a low surface, try to find a way to stabilize 3 out of 4 of your limbs. You can place a hand or elbow on your thigh or rest a knee on the surface you are reaching across. This will provide you more stability, so that your muscles do not tire as quickly. By keeping your knees relaxed, or slightly bent, you will also reduce stress across your lower back. CORRECT LIFTING TECHNIQUES  DO :  Assume a wide stance. This will provide you more stability and the opportunity to get as close as possible to the object which you are lifting.  Tense your abdominals to brace your spine. Bend at the knees and hips. Keeping your back locked in a neutral-spine position, lift using your leg muscles. Lift with your legs, keeping your back straight.  Test  the weight of unknown objects before attempting to lift them.  Try to keep your elbows locked down at your sides in order get the best strength from your shoulders when carrying an object.     Always ask for help when lifting heavy or awkward objects. INCORRECT LIFTING TECHNIQUES DO NOT:   Lock your knees when lifting, even if it is a small object.  Bend and twist. Pivot at your feet or move your feet when needing to change directions.  Assume that you can safely pick up even a paperclip without proper posture.

## 2019-03-25 ENCOUNTER — Encounter: Payer: Self-pay | Admitting: Family Medicine

## 2019-03-26 MED ORDER — MELOXICAM 15 MG PO TABS: 15.0000 mg | ORAL_TABLET | Freq: Every day | ORAL | 0 refills | Status: DC

## 2019-03-26 MED ORDER — CYCLOBENZAPRINE HCL 10 MG PO TABS: 5.0000 mg | ORAL_TABLET | Freq: Three times a day (TID) | ORAL | 0 refills | Status: DC | PRN

## 2019-03-29 DIAGNOSIS — D485 Neoplasm of uncertain behavior of skin: Secondary | ICD-10-CM | POA: Diagnosis not present

## 2019-03-29 DIAGNOSIS — C801 Malignant (primary) neoplasm, unspecified: Secondary | ICD-10-CM | POA: Diagnosis not present

## 2019-03-29 DIAGNOSIS — Z85828 Personal history of other malignant neoplasm of skin: Secondary | ICD-10-CM | POA: Diagnosis not present

## 2019-03-29 DIAGNOSIS — L578 Other skin changes due to chronic exposure to nonionizing radiation: Secondary | ICD-10-CM | POA: Diagnosis not present

## 2019-03-29 DIAGNOSIS — Z85820 Personal history of malignant melanoma of skin: Secondary | ICD-10-CM | POA: Diagnosis not present

## 2019-03-29 DIAGNOSIS — L570 Actinic keratosis: Secondary | ICD-10-CM | POA: Diagnosis not present

## 2019-04-09 ENCOUNTER — Other Ambulatory Visit: Payer: Self-pay | Admitting: Family Medicine

## 2019-04-10 ENCOUNTER — Other Ambulatory Visit: Payer: Self-pay | Admitting: Family Medicine

## 2019-04-26 ENCOUNTER — Other Ambulatory Visit: Payer: Self-pay | Admitting: Family Medicine

## 2019-04-28 DIAGNOSIS — C4330 Malignant melanoma of unspecified part of face: Secondary | ICD-10-CM | POA: Diagnosis not present

## 2019-04-28 DIAGNOSIS — D099 Carcinoma in situ, unspecified: Secondary | ICD-10-CM | POA: Diagnosis not present

## 2019-04-28 DIAGNOSIS — R61 Generalized hyperhidrosis: Secondary | ICD-10-CM | POA: Diagnosis not present

## 2019-04-28 DIAGNOSIS — C4492 Squamous cell carcinoma of skin, unspecified: Secondary | ICD-10-CM | POA: Diagnosis not present

## 2019-05-11 ENCOUNTER — Other Ambulatory Visit: Payer: Self-pay | Admitting: Family Medicine

## 2019-05-11 DIAGNOSIS — K649 Unspecified hemorrhoids: Secondary | ICD-10-CM

## 2019-05-11 DIAGNOSIS — W57XXXA Bitten or stung by nonvenomous insect and other nonvenomous arthropods, initial encounter: Secondary | ICD-10-CM

## 2019-05-11 DIAGNOSIS — M545 Low back pain, unspecified: Secondary | ICD-10-CM

## 2019-05-11 DIAGNOSIS — F418 Other specified anxiety disorders: Secondary | ICD-10-CM

## 2019-05-11 DIAGNOSIS — F32A Depression, unspecified: Secondary | ICD-10-CM

## 2019-05-11 DIAGNOSIS — F329 Major depressive disorder, single episode, unspecified: Secondary | ICD-10-CM

## 2019-05-11 DIAGNOSIS — S0101XA Laceration without foreign body of scalp, initial encounter: Secondary | ICD-10-CM

## 2019-05-11 DIAGNOSIS — D72829 Elevated white blood cell count, unspecified: Secondary | ICD-10-CM

## 2019-05-11 DIAGNOSIS — E785 Hyperlipidemia, unspecified: Secondary | ICD-10-CM

## 2019-05-18 ENCOUNTER — Other Ambulatory Visit: Payer: Self-pay | Admitting: Family Medicine

## 2019-06-10 ENCOUNTER — Ambulatory Visit (HOSPITAL_BASED_OUTPATIENT_CLINIC_OR_DEPARTMENT_OTHER)
Admission: RE | Admit: 2019-06-10 | Discharge: 2019-06-10 | Disposition: A | Discharge status: Home / Self Care | Payer: Medicare Other | Source: Ambulatory Visit | Attending: Family Medicine | Admitting: Family Medicine

## 2019-06-10 ENCOUNTER — Encounter: Payer: Self-pay | Admitting: Family Medicine

## 2019-06-10 ENCOUNTER — Telehealth: Payer: Self-pay

## 2019-06-10 ENCOUNTER — Ambulatory Visit (HOSPITAL_COMMUNITY): Payer: Medicare Other

## 2019-06-10 ENCOUNTER — Other Ambulatory Visit: Payer: Self-pay | Admitting: Family Medicine

## 2019-06-10 ENCOUNTER — Ambulatory Visit (INDEPENDENT_AMBULATORY_CARE_PROVIDER_SITE_OTHER): Payer: Medicare Other | Admitting: Family Medicine

## 2019-06-10 ENCOUNTER — Other Ambulatory Visit: Payer: Self-pay

## 2019-06-10 VITALS — BP 159/97 | HR 83 | Temp 97.7°F | Resp 18 | Ht 69.0 in | Wt 171.0 lb

## 2019-06-10 DIAGNOSIS — M545 Low back pain, unspecified: Secondary | ICD-10-CM

## 2019-06-10 DIAGNOSIS — M5136 Other intervertebral disc degeneration, lumbar region: Secondary | ICD-10-CM | POA: Diagnosis not present

## 2019-06-10 DIAGNOSIS — Z130 Encounter for screening for diseases of the blood and blood-forming organs and certain disorders involving the immune mechanism: Secondary | ICD-10-CM | POA: Diagnosis not present

## 2019-06-10 DIAGNOSIS — R739 Hyperglycemia, unspecified: Secondary | ICD-10-CM

## 2019-06-10 DIAGNOSIS — Z125 Encounter for screening for malignant neoplasm of prostate: Secondary | ICD-10-CM

## 2019-06-10 DIAGNOSIS — Z131 Encounter for screening for diabetes mellitus: Secondary | ICD-10-CM | POA: Diagnosis not present

## 2019-06-10 DIAGNOSIS — R3129 Other microscopic hematuria: Secondary | ICD-10-CM

## 2019-06-10 DIAGNOSIS — Z13220 Encounter for screening for lipoid disorders: Secondary | ICD-10-CM | POA: Diagnosis not present

## 2019-06-10 DIAGNOSIS — R109 Unspecified abdominal pain: Secondary | ICD-10-CM

## 2019-06-10 DIAGNOSIS — K5730 Diverticulosis of large intestine without perforation or abscess without bleeding: Secondary | ICD-10-CM | POA: Diagnosis not present

## 2019-06-10 LAB — POCT URINALYSIS DIP (MANUAL ENTRY)
Bilirubin, UA: NEGATIVE
Glucose, UA: NEGATIVE mg/dL
Ketones, POC UA: NEGATIVE mg/dL
Leukocytes, UA: NEGATIVE
Nitrite, UA: NEGATIVE
Protein Ur, POC: NEGATIVE mg/dL
Spec Grav, UA: 1.025 (ref 1.010–1.025)
Urobilinogen, UA: 0.2 E.U./dL
pH, UA: 5.5 (ref 5.0–8.0)

## 2019-06-10 NOTE — Patient Instructions (Addendum)
It was good to see you today, please keep me posted about how you are feeling We will get labs and a CT scan today to look for any kidney stone.  Will also check plain films of your spine

## 2019-06-10 NOTE — Progress Notes (Addendum)
[facility] [address] [place], [place] [address] [phone]  Date:  06/10/2019   Name:  Aaron Dyer   DOB:  September 30, 1965   MRN:  [id number]  PCP:  Mosie Lukes, MD    Chief Complaint: Back Pain (lower back pain, one week, possibly kidney stone)   History of Present Illness:  Aaron Dyer is a 54 y.o. very pleasant male patient who presents with the following:  Primary patient of my partner Dr. Charlett Blake with history of skin cancer, hyperlipidemia I have not seen this patient in the past  He is here today with concern of back pain He did see Dr. Nani Ravens in December with bilateral lower back pain thought to be musculoskeletal  He has notes the pain on the right side only for about 2 weeks It was worse this am- otherwise it has been pretty stable He tried a heating pad last night and it did help His mother has kidney stones, but he has no history of same No hematuria or dysuria  No vomiting, no fever   Never a smoker   He has tried a topical muscle rub and heating pad so far.  He does have some meloxicam and Flexeril at home, but has not tried them so far Looking back, he has had a few episodes of microhematuria previously  Patient Active Problem List   Diagnosis Date Noted  . Chronic left shoulder pain 04/13/2018  . DOE (dyspnea on exertion) 04/13/2018  . Abdominal pain 04/13/2018  . Acute bronchitis 04/13/2018  . Blood in stool 06/04/2017  . Night sweats 06/04/2017  . Change in bowel habits 06/04/2017  . Anemia 06/04/2017  . Preventative health care 06/04/2017  . Myalgia 06/03/2017  . 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 Squamous cell carcinoma of thigh, right 05/14/2017  . Constipation 09/08/2015  . Cold intolerance 06/04/2015  . Squamous cell carcinoma of left hand 05/11/2015  . Diverticulosis of colon without hemorrhage 12/18/2014  . Neck pain,  bilateral 11/29/2013  . ED (erectile dysfunction) 08/22/2013  . Back pain 03/15/2013  . GERD (gastroesophageal reflux disease) 08/26/2012  . Melanoma of face ([place]) 08/04/2012  . Lipoma 04/21/2012  . Sun-damaged skin 04/21/2012  . Chest pain 11/19/2011  . Colon polyps 05/28/2011  . Insomnia 05/28/2011  . Urinary frequency 04/30/2011  . Hematuria 04/30/2011  . Reflux 04/29/2011  . Prolapsed internal hemorrhoids, grade 3 04/29/2011  . Anxiety and depression   . Skin cancer   . Hyperlipidemia     Past Medical History:  Diagnosis Date  . Anxiety and depression   . Arthritis    neck  . Diverticulosis of colon without hemorrhage 12/18/2014  . ED (erectile dysfunction)   . GERD (gastroesophageal reflux disease)   . History of adenomatous polyp of colon   . History of adenomatous polyp of colon   . History of exercise stress test 06/09/2006   ECHO ETT-- NORMAL  . History of melanoma excision    nonmalignant and malignant   . History of nonmelanoma skin cancer dermatologist-  dr williford   multiple areas on body,  SCC majority  . Hyperlipidemia   . IBS (irritable bowel syndrome)   . Prolapsed internal hemorrhoids, grade 4     Past Surgical History:  Procedure Laterality Date  . COLONOSCOPY  last one 11-10-2014  dr pyrtle  . EVALUATION UNDER ANESTHESIA WITH  HEMORRHOIDECTOMY N/A 10/17/2017   Procedure: EXAM UNDER ANESTHESIA WITH HEMORRHOIDECTOMY AND HEMORRHOIDAL LIGATION/PEXY TIMES TWO;  Surgeon: Michael Boston, MD;  Location: [facility];  Service: General;  Laterality: N/A;  . EXCISION SEBACEOUS CYST  10-18-2013   @[email or web address]    ON BACK  . FLAP CLOSURE LEFT CHEEK POST WLE  09-18-2012    @[email or web address]   . MOHS SURGERY  X2 2010   right ear  . WIDE LOCAL EXCISION LEFT CHEEK AND LEFT CERVICAL SENITNAL LYMPH NODE BX  09-07-2012    @[facility]     Social History   Tobacco Use  . Smoking status: Never Smoker  . Smokeless tobacco: Never Used  Substance Use Topics  . Alcohol use: Yes     Alcohol/week: 0.0 standard drinks    Comment: OCCASIONAL  . Drug use: No    Family History  Problem Relation Age of Onset  . COPD Mother   . Emphysema Mother   . Stroke Mother        X 2  . Heart disease Mother        CABG in her 45s  . Hyperlipidemia Mother   . Hypertension Mother   . Diabetes Mother        type 2  . Cancer Father        melanoma  . Stroke Father        4  . Hypertension Father   . Alcohol abuse Father   . Heart disease Father   . Diabetes Maternal Grandmother        type 2  . Heart disease Maternal Grandmother        CHF  . Heart attack Maternal Grandfather   . Heart disease Paternal Grandmother   . Heart disease Paternal Grandfather   . Colon cancer Neg Hx   . Colon polyps Neg Hx     Allergies  Allergen Reactions  . Crestor [Rosuvastatin] Other (See Comments)    myalgias    Medication list has been reviewed and updated.  Current Outpatient Medications on File Prior to Visit  Medication Sig Dispense Refill  . buPROPion (WELLBUTRIN XL) 300 MG 24 hr tablet Take 1 tablet by mouth once daily 90 tablet 0  . cetirizine (ZYRTEC) 10 MG tablet TAKE 1 TABLET BY MOUTH AT BEDTIME AS NEEDED FOR ALLERGIES 90 tablet 0  . cyclobenzaprine (FLEXERIL) 10 MG tablet Take 0.5-1 tablets (5-10 mg total) by mouth 3 (three) times daily as needed for muscle spasms. 21 tablet 0  . famotidine (PEPCID) 40 MG tablet Take 1 tablet by mouth once daily 90 tablet 0  . FLUoxetine (PROZAC) 20 MG capsule Take 1 capsule by mouth once daily 90 capsule 0  . fluticasone (FLONASE) 50 MCG/ACT nasal spray Place 2 sprays into both nostrils daily as needed. 16 g 3  . meloxicam (MOBIC) 15 MG tablet Take 1 tablet by mouth once daily 30 tablet 0   No current facility-administered medications on file prior to visit.    Review of Systems:  As per HPI- otherwise negative.   Physical Examination: Vitals:   06/10/19 1417  BP: (!) 159/97  Pulse: 83  Resp: 18  Temp: 97.7 F (36.5  C)  SpO2: 98%   Vitals:   06/10/19 1417  Weight: 171 lb (77.6 kg)  Height: 5\' 9"  (1.753 m)   Body mass index is 25.25 kg/m. Ideal Body Weight: Weight in (lb) to have BMI = 25: 168.9  GEN: no acute distress.  Looks well, normal weight and  fit build HEENT: Atraumatic, Normocephalic.  Ears and Nose: No external deformity. CV: RRR, No M/G/R. No JVD. No thrill. No extra heart sounds. PULM: CTA B, no wheezes, crackles, rhonchi. No retractions. No resp. distress. No accessory muscle use. ABD: S, NT, ND, +BS. No rebound. No HSM.  Belly is benign EXTR: No c/c/e PSYCH: Normally interactive. Conversant.  Normal thoracolumbar range of motion.  Normal bilateral lower extremity strength, sensation, DTR.  Negative straight leg raise No CVA tenderness to percussion He does have some discomfort to palpation over the right thoracic muscles.  He also has pain with twisting back extension suggestive of facet joint disease  BP Readings from Last 3 Encounters:  06/10/19 (!) 159/97  03/24/19 128/84  12/22/18 122/82   Results for orders placed or performed in visit on 06/10/19  POCT urinalysis dipstick  Result Value Ref Range   Color, UA yellow yellow   Clarity, UA clear clear   Glucose, UA negative negative mg/dL   Bilirubin, UA negative negative   Ketones, POC UA negative negative mg/dL   Spec Grav, UA 1.025 1.010 - 1.025   Blood, UA moderate (A) negative   pH, UA 5.5 5.0 - 8.0   Protein Ur, POC negative negative mg/dL   Urobilinogen, UA 0.2 0.2 or 1.0 E.U./dL   Nitrite, UA Negative Negative   Leukocytes, UA Negative Negative    Assessment and Plan: Flank pain - Plan: POCT urinalysis dipstick, Urine Culture, CBC, Comprehensive metabolic panel, CANCELED: DG Lumbar Spine Complete  Microhematuria - Plan: Urine Microscopic Only  Abdominal pain, unspecified abdominal location - Plan: CT RENAL STONE STUDY  Screening for prostate cancer - Plan: PSA, Medicare ( [place] [facility]  only)  Screening for diabetes mellitus - Plan: Comprehensive metabolic panel, Hemoglobin A1c  Screening for deficiency anemia - Plan: CBC  Screening for hyperlipidemia - Plan: Lipid panel  Hyperglycemia - Plan: Hemoglobin A1c  Here today with concern of flank pain He is noted to have microhematuria-concern for nephrolithiasis.  Patient would like to pursue a CT scan today, will arrange.  Also plan routine labs as above, plain films of his lumbar spine  Urine culture and urine micro pending.  Depending these results, may need referral to urology for microhematuria.  Of note patient has never been a smoker  Signed Lamar Blinks, MD  Received his imaging studies as follows-called patient and spoke with him on the phone Negative for kidney stone or other acute finding in his abdomen pelvis, symptoms likely due to musculoskeletal back pain Views of his lumbar spine were also obtained during the CT scan.  He does have multilevel degenerative disc disease and facet arthropathy  Gust this with patient.  He has Flexeril and meloxicam at home; he would like to try these medications as needed.  He is aware that Flexeril can cause sedation  Blood pressure slightly high today, but this is likely due to pain  I have advised him that I will be in touch pending the rest of his labs, he will let me know if any worsening or change of his symptoms in the meantime CT L-SPINE NO CHARGE  Result Date: 06/10/2019 CLINICAL DATA:  54 year old male with history of flank pain. Microscopic hematuria. EXAM: CT ABDOMEN AND PELVIS WITHOUT CONTRAST TECHNIQUE: Multidetector CT imaging of the abdomen and pelvis was performed following the standard protocol without IV contrast. COMPARISON:  No priors. FINDINGS: Lower chest: Unremarkable. Hepatobiliary: 2.8 x 1.8 cm intermediate attenuation (38 HU) lesion in segment 7 of the  liver, incompletely characterized on today's noncontrast CT examination. Unenhanced appearance of  the gallbladder is normal. Pancreas: No definite pancreatic mass or peripancreatic fluid collections or inflammatory changes are noted on today's noncontrast CT examination. Spleen: Unremarkable. Adrenals/Urinary Tract: There are no abnormal calcifications within the collecting system of either kidney, along the course of either ureter, or within the lumen of the urinary bladder. No hydroureteronephrosis or perinephric stranding to suggest urinary tract obstruction at this time. The unenhanced appearance of the kidneys is unremarkable bilaterally. Unenhanced appearance of the urinary bladder is normal. Bilateral adrenal glands are normal in appearance. Stomach/Bowel: Unenhanced appearance of the stomach is normal. No pathologic dilatation of small bowel or colon. Numerous colonic diverticulae are noted, particularly in the sigmoid colon, without surrounding inflammatory changes to suggest an acute diverticulitis at this time. Normal appendix. Vascular/Lymphatic: Aortic atherosclerosis. No lymphadenopathy noted in the abdomen or pelvis. Reproductive: Prostate gland and seminal vesicles are unremarkable in appearance. Other: No significant volume of ascites.  No pneumoperitoneum. Musculoskeletal: There are no aggressive appearing lytic or blastic lesions noted in the visualized portions of the skeleton. Lumbar spine: Mild multilevel degenerative disc disease, most severe at T12-L1 and L2-L3. Mild multilevel facet arthropathy. No acute displaced fracture or compression type fractures in the lumbar spine. IMPRESSION: 1. No explanation for the patient's history of microscopic hematuria or flank pain. Specifically, no urinary tract calculi no findings of urinary tract obstruction are noted at this time. 2. No acute abnormality of the lumbar spine. 3. Colonic diverticulosis without evidence of acute diverticulitis at this time. 4. 2.8 x 1.8 cm intermediate attenuation lesion in segment 7 of the liver, incompletely  characterized on today's noncontrast CT examination. 5. Aortic atherosclerosis. Electronically Signed   By: Vinnie Langton M.D.   On: 06/10/2019 16:19   CT RENAL STONE STUDY  Result Date: 06/10/2019 CLINICAL DATA:  54 year old male with history of flank pain. Microscopic hematuria. EXAM: CT ABDOMEN AND PELVIS WITHOUT CONTRAST TECHNIQUE: Multidetector CT imaging of the abdomen and pelvis was performed following the standard protocol without IV contrast. COMPARISON:  No priors. FINDINGS: Lower chest: Unremarkable. Hepatobiliary: 2.8 x 1.8 cm intermediate attenuation (38 HU) lesion in segment 7 of the liver, incompletely characterized on today's noncontrast CT examination. Unenhanced appearance of the gallbladder is normal. Pancreas: No definite pancreatic mass or peripancreatic fluid collections or inflammatory changes are noted on today's noncontrast CT examination. Spleen: Unremarkable. Adrenals/Urinary Tract: There are no abnormal calcifications within the collecting system of either kidney, along the course of either ureter, or within the lumen of the urinary bladder. No hydroureteronephrosis or perinephric stranding to suggest urinary tract obstruction at this time. The unenhanced appearance of the kidneys is unremarkable bilaterally. Unenhanced appearance of the urinary bladder is normal. Bilateral adrenal glands are normal in appearance. Stomach/Bowel: Unenhanced appearance of the stomach is normal. No pathologic dilatation of small bowel or colon. Numerous colonic diverticulae are noted, particularly in the sigmoid colon, without surrounding inflammatory changes to suggest an acute diverticulitis at this time. Normal appendix. Vascular/Lymphatic: Aortic atherosclerosis. No lymphadenopathy noted in the abdomen or pelvis. Reproductive: Prostate gland and seminal vesicles are unremarkable in appearance. Other: No significant volume of ascites.  No pneumoperitoneum. Musculoskeletal: There are no aggressive  appearing lytic or blastic lesions noted in the visualized portions of the skeleton. Lumbar spine: Mild multilevel degenerative disc disease, most severe at T12-L1 and L2-L3. Mild multilevel facet arthropathy. No acute displaced fracture or compression type fractures in the lumbar spine. IMPRESSION: 1. No explanation  for the patient's history of microscopic hematuria or flank pain. Specifically, no urinary tract calculi no findings of urinary tract obstruction are noted at this time. 2. No acute abnormality of the lumbar spine. 3. Colonic diverticulosis without evidence of acute diverticulitis at this time. 4. 2.8 x 1.8 cm intermediate attenuation lesion in segment 7 of the liver, incompletely characterized on today's noncontrast CT examination. 5. Aortic atherosclerosis. Electronically Signed   By: Vinnie Langton M.D.   On: 06/10/2019 16:19   Received his labs 3/19- message to pt  Results for orders placed or performed in visit on 06/10/19  Urine Microscopic Only  Result Value Ref Range   WBC, UA none seen 0-2/hpf   RBC / HPF 0-2/hpf 0-2/hpf   Bacteria, UA Many(>50/hpf) (A) None  CBC  Result Value Ref Range   WBC 8.6 4.0 - 10.5 K/uL   RBC 4.62 4.22 - 5.81 Mil/uL   Platelets 280.0 150.0 - 400.0 K/uL   Hemoglobin 14.8 13.0 - 17.0 g/dL   HCT 43.6 39.0 - 52.0 %   MCV 94.5 78.0 - 100.0 fl   MCHC 34.0 30.0 - 36.0 g/dL   RDW 14.2 11.5 - 15.5 %  Comprehensive metabolic panel  Result Value Ref Range   Sodium 137 135 - 145 mEq/L   Potassium 5.2 (H) 3.5 - 5.1 mEq/L   Chloride 101 96 - 112 mEq/L   CO2 30 19 - 32 mEq/L   Glucose, Bld 87 70 - 99 mg/dL   BUN 16 6 - 23 mg/dL   Creatinine, Ser 1.02 0.40 - 1.50 mg/dL   Total Bilirubin 0.5 0.2 - 1.2 mg/dL   Alkaline Phosphatase 82 39 - 117 U/L   AST 16 0 - 37 U/L   ALT 26 0 - 53 U/L   Total Protein 6.9 6.0 - 8.3 g/dL   Albumin 4.5 3.5 - 5.2 g/dL   GFR 76.00 >60.00 mL/min   Calcium 9.8 8.4 - 10.5 mg/dL  Hemoglobin A1c  Result Value Ref Range    Hgb A1c MFr Bld 5.6 4.6 - 6.5 %  Lipid panel  Result Value Ref Range   Cholesterol 269 (H) 0 - 200 mg/dL   Triglycerides 203.0 (H) 0.0 - 149.0 mg/dL   HDL 37.50 (L) >39.00 mg/dL   VLDL 40.6 (H) 0.0 - 40.0 mg/dL   Total CHOL/HDL Ratio 7    NonHDL 231.25   PSA, Medicare ( [place] [facility] only)  Result Value Ref Range   PSA 0.54 0.10 - 4.00 ng/ml  LDL cholesterol, direct  Result Value Ref Range   Direct LDL 193.0 mg/dL  POCT urinalysis dipstick  Result Value Ref Range   Color, UA yellow yellow   Clarity, UA clear clear   Glucose, UA negative negative mg/dL   Bilirubin, UA negative negative   Ketones, POC UA negative negative mg/dL   Spec Grav, UA 1.025 1.010 - 1.025   Blood, UA moderate (A) negative   pH, UA 5.5 5.0 - 8.0   Protein Ur, POC negative negative mg/dL   Urobilinogen, UA 0.2 0.2 or 1.0 E.U./dL   Nitrite, UA Negative Negative   Leukocytes, UA Negative Negative

## 2019-06-11 ENCOUNTER — Encounter: Payer: Self-pay | Admitting: Family Medicine

## 2019-06-11 LAB — CBC
HCT: 43.6 % (ref 39.0–52.0)
Hemoglobin: 14.8 g/dL (ref 13.0–17.0)
MCHC: 34 g/dL (ref 30.0–36.0)
MCV: 94.5 fl (ref 78.0–100.0)
Platelets: 280 10*3/uL (ref 150.0–400.0)
RBC: 4.62 Mil/uL (ref 4.22–5.81)
RDW: 14.2 % (ref 11.5–15.5)
WBC: 8.6 10*3/uL (ref 4.0–10.5)

## 2019-06-11 LAB — HEMOGLOBIN A1C: Hgb A1c MFr Bld: 5.6 % (ref 4.6–6.5)

## 2019-06-11 LAB — COMPREHENSIVE METABOLIC PANEL
ALT: 26 U/L (ref 0–53)
AST: 16 U/L (ref 0–37)
Albumin: 4.5 g/dL (ref 3.5–5.2)
Alkaline Phosphatase: 82 U/L (ref 39–117)
BUN: 16 mg/dL (ref 6–23)
CO2: 30 mEq/L (ref 19–32)
Calcium: 9.8 mg/dL (ref 8.4–10.5)
Chloride: 101 mEq/L (ref 96–112)
Creatinine, Ser: 1.02 mg/dL (ref 0.40–1.50)
GFR: 76 mL/min (ref >60.00)
Glucose, Bld: 87 mg/dL (ref 70–99)
Potassium: 5.2 mEq/L — ABNORMAL HIGH (ref 3.5–5.1)
Sodium: 137 mEq/L (ref 135–145)
Total Bilirubin: 0.5 mg/dL (ref 0.2–1.2)
Total Protein: 6.9 g/dL (ref 6.0–8.3)

## 2019-06-11 LAB — URINALYSIS, MICROSCOPIC ONLY: WBC, UA: NONE SEEN (ref 0–?)

## 2019-06-11 LAB — LIPID PANEL
Cholesterol: 269 mg/dL — ABNORMAL HIGH (ref 0–200)
HDL: 37.5 mg/dL — ABNORMAL LOW (ref >39.00)
NonHDL: 231.25
Total CHOL/HDL Ratio: 7
Triglycerides: 203 mg/dL — ABNORMAL HIGH (ref 0.0–149.0)
VLDL: 40.6 mg/dL — ABNORMAL HIGH (ref 0.0–40.0)

## 2019-06-11 LAB — LDL CHOLESTEROL, DIRECT: Direct LDL: 193 mg/dL

## 2019-06-11 LAB — URINE CULTURE
MICRO NUMBER:: 10266471
Result:: NO GROWTH
SPECIMEN QUALITY:: ADEQUATE

## 2019-06-11 LAB — PSA, MEDICARE: PSA: 0.54 ng/ml (ref 0.10–4.00)

## 2019-06-12 ENCOUNTER — Encounter: Payer: Self-pay | Admitting: Family Medicine

## 2019-06-13 ENCOUNTER — Encounter: Payer: Self-pay | Admitting: Family Medicine

## 2019-06-13 DIAGNOSIS — K769 Liver disease, unspecified: Secondary | ICD-10-CM

## 2019-06-14 ENCOUNTER — Other Ambulatory Visit: Payer: Self-pay | Admitting: Family Medicine

## 2019-06-14 DIAGNOSIS — R160 Hepatomegaly, not elsewhere classified: Secondary | ICD-10-CM

## 2019-06-14 DIAGNOSIS — C4330 Malignant melanoma of unspecified part of face: Secondary | ICD-10-CM

## 2019-06-14 DIAGNOSIS — K769 Liver disease, unspecified: Secondary | ICD-10-CM

## 2019-06-19 ENCOUNTER — Ambulatory Visit (HOSPITAL_BASED_OUTPATIENT_CLINIC_OR_DEPARTMENT_OTHER)
Admission: RE | Admit: 2019-06-19 | Discharge: 2019-06-19 | Disposition: A | Discharge status: Home / Self Care | Payer: Medicare Other | Source: Ambulatory Visit | Attending: Family Medicine | Admitting: Family Medicine

## 2019-06-19 ENCOUNTER — Other Ambulatory Visit: Payer: Self-pay

## 2019-06-19 DIAGNOSIS — C4330 Malignant melanoma of unspecified part of face: Secondary | ICD-10-CM | POA: Insufficient documentation

## 2019-06-19 DIAGNOSIS — R160 Hepatomegaly, not elsewhere classified: Secondary | ICD-10-CM | POA: Insufficient documentation

## 2019-06-19 DIAGNOSIS — K769 Liver disease, unspecified: Secondary | ICD-10-CM | POA: Diagnosis not present

## 2019-06-19 DIAGNOSIS — D1803 Hemangioma of intra-abdominal structures: Secondary | ICD-10-CM | POA: Diagnosis not present

## 2019-06-19 MED ORDER — GADOBUTROL 1 MMOL/ML IV SOLN
7.5000 mL | Freq: Once | INTRAVENOUS | Status: AC | PRN
  Administered 2019-06-19: 7.5 mL via INTRAVENOUS

## 2019-06-21 ENCOUNTER — Encounter: Payer: Self-pay | Admitting: Family Medicine

## 2019-07-01 ENCOUNTER — Other Ambulatory Visit: Payer: Self-pay | Admitting: Family Medicine

## 2019-07-20 ENCOUNTER — Ambulatory Visit (INDEPENDENT_AMBULATORY_CARE_PROVIDER_SITE_OTHER): Payer: Medicare Other | Admitting: Medical

## 2019-07-20 ENCOUNTER — Other Ambulatory Visit: Payer: Self-pay

## 2019-07-20 ENCOUNTER — Encounter: Payer: Self-pay | Admitting: Medical

## 2019-07-20 VITALS — BP 150/98 | HR 71 | Temp 97.7°F | Resp 18 | Ht 69.0 in | Wt 171.1 lb

## 2019-07-20 DIAGNOSIS — I2510 Atherosclerotic heart disease of native coronary artery without angina pectoris: Secondary | ICD-10-CM

## 2019-07-20 MED ORDER — LOSARTAN POTASSIUM-HCTZ 100-12.5 MG PO TABS: 1.0000 | ORAL_TABLET | Freq: Every day | ORAL | 3 refills | Status: DC

## 2019-07-20 NOTE — Progress Notes (Signed)
Subjective:    Patient ID: Aaron Dyer, male    DOB: January 15, 1966, 54 y.o.   MRN: [id number]  HPI  Pt in for follow up.  Pt states his bp has been up past couple of months.   Pt has bp 158/90 yesterday at derm office. One month ago 158/97. Today high as well. No cardiac or neurologic signs/symptoms.  Pt does have history of high cholesterol. Pt had side effects with statins. He stopped repatha in the past. Known CAD.  IMPRESSION: 1. Coronary calcium score of 192. This was 90th percentile for age and sex matched control.  2. Normal coronary origin with right dominance.  3. No evidence of obstructive CAD.     Review of Systems  Constitutional: Negative for chills, fatigue and fever.  Respiratory: Negative for cough, chest tightness, shortness of breath and wheezing.   Cardiovascular: Negative for chest pain and palpitations.  Skin: Negative for rash.  Neurological: Negative for dizziness, seizures, speech difficulty, weakness and light-headedness.  Hematological: Negative for adenopathy. Does not bruise/bleed easily.  Psychiatric/Behavioral: Negative for agitation and confusion.    Past Medical History:  Diagnosis Date  . Anxiety and depression   . Arthritis    neck  . Diverticulosis of colon without hemorrhage 12/18/2014  . ED (erectile dysfunction)   . GERD (gastroesophageal reflux disease)   . History of adenomatous polyp of colon   . History of adenomatous polyp of colon   . History of exercise stress test 06/09/2006   ECHO ETT-- NORMAL  . History of melanoma excision    nonmalignant and malignant   . History of nonmelanoma skin cancer dermatologist-  dr williford   multiple areas on body,  SCC majority  . Hyperlipidemia   . IBS (irritable bowel syndrome)   . Prolapsed internal hemorrhoids, grade 4      Social History   Socioeconomic History  . Marital status: Married    Spouse name: Sharyn Lull  . Number of children: 2  . Years of education: Not on  file  . Highest education level: Not on file  Occupational History  . Occupation: Landscape  Tobacco Use  . Smoking status: Never Smoker  . Smokeless tobacco: Never Used  Substance and Sexual Activity  . Alcohol use: Yes    Alcohol/week: 0.0 standard drinks    Comment: OCCASIONAL  . Drug use: No  . Sexual activity: Not on file  Other Topics Concern  . Not on file  Social History Narrative  . Not on file   Social Determinants of Health   Financial Resource Strain:   . Difficulty of Paying Living Expenses:   Food Insecurity:   . Worried About Charity fundraiser in the Last Year:   . Arboriculturist in the Last Year:   Transportation Needs:   . Film/video editor (Medical):   Marland Kitchen Lack of Transportation (Non-Medical):   Physical Activity:   . Days of Exercise per Week:   . Minutes of Exercise per Session:   Stress:   . Feeling of Stress :   Social Connections:   . Frequency of Communication with Friends and Family:   . Frequency of Social Gatherings with Friends and Family:   . Attends Religious Services:   . Active Member of Clubs or Organizations:   . Attends Archivist Meetings:   Marland Kitchen Marital Status:   Intimate Partner Violence:   . Fear of Current or Ex-Partner:   . Emotionally Abused:   .  Physically Abused:   . Sexually Abused:     Past Surgical History:  Procedure Laterality Date  . COLONOSCOPY  last one 11-10-2014  dr pyrtle  . EVALUATION UNDER ANESTHESIA WITH HEMORRHOIDECTOMY N/A 10/17/2017   Procedure: EXAM UNDER ANESTHESIA WITH HEMORRHOIDECTOMY AND HEMORRHOIDAL LIGATION/PEXY TIMES TWO;  Surgeon: Michael Boston, MD;  Location: [facility];  Service: General;  Laterality: N/A;  . EXCISION SEBACEOUS CYST  10-18-2013   @[email or web address]    ON BACK  . FLAP CLOSURE LEFT CHEEK POST WLE  09-18-2012    @[email or web address]   . MOHS SURGERY  X2 2010   right ear  . WIDE LOCAL EXCISION LEFT CHEEK AND LEFT CERVICAL SENITNAL LYMPH NODE BX  09-07-2012    @[facility]      Family History  Problem Relation Age of Onset  . COPD Mother   . Emphysema Mother   . Stroke Mother        X 2  . Heart disease Mother        CABG in her 40s  . Hyperlipidemia Mother   . Hypertension Mother   . Diabetes Mother        type 2  . Cancer Father        melanoma  . Stroke Father        40  . Hypertension Father   . Alcohol abuse Father   . Heart disease Father   . Diabetes Maternal Grandmother        type 2  . Heart disease Maternal Grandmother        CHF  . Heart attack Maternal Grandfather   . Heart disease Paternal Grandmother   . Heart disease Paternal Grandfather   . Colon cancer Neg Hx   . Colon polyps Neg Hx     Allergies  Allergen Reactions  . Crestor [Rosuvastatin] Other (See Comments)    myalgias    Current Outpatient Medications on File Prior to Visit  Medication Sig Dispense Refill  . buPROPion (WELLBUTRIN XL) 300 MG 24 hr tablet Take 1 tablet by mouth once daily 90 tablet 0  . cetirizine (ZYRTEC) 10 MG tablet TAKE 1 TABLET BY MOUTH AT BEDTIME AS NEEDED FOR ALLERGIES 90 tablet 0  . cyclobenzaprine (FLEXERIL) 10 MG tablet Take 0.5-1 tablets (5-10 mg total) by mouth 3 (three) times daily as needed for muscle spasms. 21 tablet 0  . famotidine (PEPCID) 40 MG tablet Take 1 tablet by mouth once daily 90 tablet 0  . FLUoxetine (PROZAC) 20 MG capsule Take 1 capsule by mouth once daily 90 capsule 0  . fluticasone (FLONASE) 50 MCG/ACT nasal spray Place 2 sprays into both nostrils daily as needed. 16 g 3  . meloxicam (MOBIC) 15 MG tablet Take 1 tablet by mouth once daily 30 tablet 0   No current facility-administered medications on file prior to visit.    BP (!) 150/98   Pulse 71   Temp 97.7 F (36.5 C) (Temporal)   Resp 18   Ht 5\' 9"  (1.753 m)   Wt 171 lb 1.6 oz (77.6 kg)   SpO2 100%   BMI 25.27 kg/m       Objective:   Physical Exam  General Mental Status- Alert. General Appearance- Not in acute distress.   Skin General: Color-  Normal Color. Moisture- Normal Moisture.  Neck Carotid Arteries- Normal color. Moisture- Normal Moisture. No carotid bruits. No JVD.  Chest and Lung Exam Auscultation: Breath Sounds:-Normal.  Cardiovascular Auscultation:Rythm- Regular. Murmurs & Other Heart Sounds:Auscultation of  the heart reveals- No Murmurs.  Abdomen Inspection:-Inspeection Normal. Palpation/Percussion:Note:No mass. Palpation and Percussion of the abdomen reveal- Non Tender, Non Distended + BS, no rebound or guarding.   Neurologic Cranial Nerve exam:- CN III-XII intact(No nystagmus), symmetric smile. Strength:- 5/5 equal and symmetric strength both upper and lower extremities.      Assessment & Plan:  Your bp has been moderate to high level over past 2 months. Level today indicated probable need to be on combination treatment regimen. Rx losartan sent to your pharmacy. Please get bp cuff over the counter so you can check bp at home.   With hx of high cholesterol and some CAD on prior studies will refer you back to cardiologist as they will likely restart you on repatha again.  Eat low cholesterol diet and get mild daily exercise.  If any cardio or neurologic signs/symptoms after hours then ED evaluation.  Follow up in 2 weeks. Can be virtual visit if you have the over the counter blood pressure cuff.  Time spent with patient today was 25  minutes which consisted of chart review, discussing diagnosis, work up, treatment, referral  and documentation.

## 2019-07-20 NOTE — Patient Instructions (Addendum)
Your bp has been moderate to high level over past 2 months. Level today indicated probable need to be on combination treatment regimen. Rx losartan sent to your pharmacy. Please get bp cuff over the counter so you can check bp at home.   With hx of high cholesterol and some CAD on prior studies will refer you back to cardiologist as they will likely restart you on repatha again.  Eat low cholesterol diet and get mild daily exercise.  If any cardio or neurologic signs/symptoms after hours then ED evaluation.  Follow up in 2 weeks. Can be virtual visit if you have the over the counter blood pressure cuff  Mackie Pai, PA-C

## 2019-07-25 ENCOUNTER — Other Ambulatory Visit: Payer: Self-pay | Admitting: Family Medicine

## 2019-07-25 DIAGNOSIS — D72829 Elevated white blood cell count, unspecified: Secondary | ICD-10-CM

## 2019-07-25 DIAGNOSIS — F418 Other specified anxiety disorders: Secondary | ICD-10-CM

## 2019-07-25 DIAGNOSIS — S0101XA Laceration without foreign body of scalp, initial encounter: Secondary | ICD-10-CM

## 2019-07-25 DIAGNOSIS — E785 Hyperlipidemia, unspecified: Secondary | ICD-10-CM

## 2019-07-25 DIAGNOSIS — K649 Unspecified hemorrhoids: Secondary | ICD-10-CM

## 2019-07-25 DIAGNOSIS — M545 Low back pain, unspecified: Secondary | ICD-10-CM

## 2019-07-25 DIAGNOSIS — F32A Depression, unspecified: Secondary | ICD-10-CM

## 2019-07-25 DIAGNOSIS — F329 Major depressive disorder, single episode, unspecified: Secondary | ICD-10-CM

## 2019-07-25 DIAGNOSIS — W57XXXA Bitten or stung by nonvenomous insect and other nonvenomous arthropods, initial encounter: Secondary | ICD-10-CM

## 2019-07-29 ENCOUNTER — Other Ambulatory Visit: Payer: Self-pay | Admitting: Family Medicine

## 2019-08-02 ENCOUNTER — Other Ambulatory Visit: Payer: Self-pay

## 2019-08-02 ENCOUNTER — Ambulatory Visit (INDEPENDENT_AMBULATORY_CARE_PROVIDER_SITE_OTHER): Payer: Medicare Other | Admitting: Cardiology

## 2019-08-02 ENCOUNTER — Other Ambulatory Visit: Payer: Self-pay | Admitting: Family Medicine

## 2019-08-02 ENCOUNTER — Encounter: Payer: Self-pay | Admitting: Cardiology

## 2019-08-02 VITALS — BP 140/98 | HR 81 | Temp 97.7°F | Ht 69.0 in | Wt 164.6 lb

## 2019-08-02 DIAGNOSIS — S0101XA Laceration without foreign body of scalp, initial encounter: Secondary | ICD-10-CM

## 2019-08-02 DIAGNOSIS — M545 Low back pain, unspecified: Secondary | ICD-10-CM

## 2019-08-02 DIAGNOSIS — W57XXXA Bitten or stung by nonvenomous insect and other nonvenomous arthropods, initial encounter: Secondary | ICD-10-CM

## 2019-08-02 DIAGNOSIS — K649 Unspecified hemorrhoids: Secondary | ICD-10-CM

## 2019-08-02 DIAGNOSIS — E782 Mixed hyperlipidemia: Secondary | ICD-10-CM

## 2019-08-02 DIAGNOSIS — Z7189 Other specified counseling: Secondary | ICD-10-CM | POA: Diagnosis not present

## 2019-08-02 DIAGNOSIS — E785 Hyperlipidemia, unspecified: Secondary | ICD-10-CM

## 2019-08-02 DIAGNOSIS — I2510 Atherosclerotic heart disease of native coronary artery without angina pectoris: Secondary | ICD-10-CM

## 2019-08-02 DIAGNOSIS — F418 Other specified anxiety disorders: Secondary | ICD-10-CM

## 2019-08-02 DIAGNOSIS — Z713 Dietary counseling and surveillance: Secondary | ICD-10-CM

## 2019-08-02 DIAGNOSIS — Z7182 Exercise counseling: Secondary | ICD-10-CM

## 2019-08-02 DIAGNOSIS — D72829 Elevated white blood cell count, unspecified: Secondary | ICD-10-CM

## 2019-08-02 DIAGNOSIS — F32A Depression, unspecified: Secondary | ICD-10-CM

## 2019-08-02 DIAGNOSIS — I10 Essential (primary) hypertension: Secondary | ICD-10-CM

## 2019-08-02 DIAGNOSIS — F329 Major depressive disorder, single episode, unspecified: Secondary | ICD-10-CM

## 2019-08-02 NOTE — Patient Instructions (Addendum)
Medication Instructions:  Your Physician recommend you continue on your current medication as directed.    *If you need a refill on your cardiac medications before your next appointment, please call your pharmacy*   Lab Work: None   Testing/Procedures: None   Follow-Up: At [facility], you and your health needs are our priority.  As part of our continuing mission to provide you with exceptional heart care, we have created designated Provider Care Teams.  These Care Teams include your primary Cardiologist (physician) and Advanced Practice Providers (APPs -  Physician Assistants and Nurse Practitioners) who all work together to provide you with the care you need, when you need it.  We recommend signing up for the patient portal called "MyChart".  Sign up information is provided on this After Visit Summary.  MyChart is used to connect with patients for Virtual Visits (Telemedicine).  Patients are able to view lab/test results, encounter notes, upcoming appointments, etc.  Non-urgent messages can be sent to your provider as well.   To learn more about what you can do with MyChart, go to [email or web address].    Your next appointment:   1 year(s)  The format for your next appointment:   In Person  Provider:   Buford Dresser, MD   Your physician recommends that you schedule a follow-up appointment with [facility] ( [facility] D) first available.     Mediterranean Diet A Mediterranean diet refers to food and lifestyle choices that are based on the traditions of countries located on the [employer]. This way of eating has been shown to help prevent certain conditions and improve outcomes for people who have chronic diseases, like kidney disease and heart disease. What are tips for following this plan? Lifestyle  Cook and eat meals together with your family, when possible.  Drink enough fluid to keep your urine clear or pale yellow.  Be physically active every day.  This includes: ? Aerobic exercise like running or swimming. ? Leisure activities like gardening, walking, or housework.  Get 7-8 hours of sleep each night.  If recommended by your health care provider, drink red wine in moderation. This means 1 glass a day for nonpregnant women and 2 glasses a day for men. A glass of wine equals 5 oz (150 mL). Reading food labels   Check the serving size of packaged foods. For foods such as rice and pasta, the serving size refers to the amount of cooked product, not dry.  Check the total fat in packaged foods. Avoid foods that have saturated fat or trans fats.  Check the ingredients list for added sugars, such as corn syrup. Shopping  At the grocery store, buy most of your food from the areas near the walls of the store. This includes: ? Fresh fruits and vegetables (produce). ? Grains, beans, nuts, and seeds. Some of these may be available in unpackaged forms or large amounts (in bulk). ? Fresh seafood. ? Poultry and eggs. ? Low-fat dairy products.  Buy whole ingredients instead of prepackaged foods.  Buy fresh fruits and vegetables in-season from local farmers markets.  Buy frozen fruits and vegetables in resealable bags.  If you do not have access to quality fresh seafood, buy precooked frozen shrimp or canned fish, such as tuna, salmon, or sardines.  Buy small amounts of raw or cooked vegetables, salads, or olives from the deli or salad bar at your store.  Stock your pantry so you always have certain foods on hand, such as olive oil, canned  tuna, canned tomatoes, rice, pasta, and beans. Cooking  Cook foods with extra-virgin olive oil instead of using butter or other vegetable oils.  Have meat as a side dish, and have vegetables or grains as your main dish. This means having meat in small portions or adding small amounts of meat to foods like pasta or stew.  Use beans or vegetables instead of meat in common dishes like chili or  lasagna.  Experiment with different cooking methods. Try roasting or broiling vegetables instead of steaming or sauteing them.  Add frozen vegetables to soups, stews, pasta, or rice.  Add nuts or seeds for added healthy fat at each meal. You can add these to yogurt, salads, or vegetable dishes.  Marinate fish or vegetables using olive oil, lemon juice, garlic, and fresh herbs. Meal planning   Plan to eat 1 vegetarian meal one day each week. Try to work up to 2 vegetarian meals, if possible.  Eat seafood 2 or more times a week.  Have healthy snacks readily available, such as: ? Vegetable sticks with hummus. ? [place] yogurt. ? Fruit and nut trail mix.  Eat balanced meals throughout the week. This includes: ? Fruit: 2-3 servings a day ? Vegetables: 4-5 servings a day ? Low-fat dairy: 2 servings a day ? Fish, poultry, or lean meat: 1 serving a day ? Beans and legumes: 2 or more servings a week ? Nuts and seeds: 1-2 servings a day ? Whole grains: 6-8 servings a day ? Extra-virgin olive oil: 3-4 servings a day  Limit red meat and sweets to only a few servings a month What are my food choices?  Mediterranean diet ? Recommended  Grains: Whole-grain pasta. Brown rice. Bulgar wheat. Polenta. Couscous. Whole-wheat bread. [place].  Vegetables: Artichokes. Beets. Broccoli. Cabbage. Carrots. Eggplant. Green beans. Chard. Kale. Spinach. Onions. Leeks. Peas. Squash. Tomatoes. Peppers. Radishes.  Fruits: Apples. Apricots. Avocado. Berries. Bananas. Cherries. Dates. Figs. Grapes. Lemons. Melon. Oranges. Peaches. Plums. Pomegranate.  Meats and other protein foods: Beans. Almonds. Sunflower seeds. Pine nuts. Peanuts. [place]. Salmon. Scallops. Shrimp. [place]. Tilapia. Clams. Oysters. Eggs.  Dairy: Low-fat milk. Cheese. Greek yogurt.  Beverages: Water. Red wine. Herbal tea.  Fats and oils: Extra virgin olive oil. Avocado oil. Grape seed oil.  Sweets and desserts: [place] yogurt with  honey. Baked apples. Poached pears. Trail mix.  Seasoning and other foods: Basil. Cilantro. Coriander. Cumin. Mint. Parsley. Sage. Rosemary. Tarragon. Garlic. Oregano. Thyme. Pepper. Balsalmic vinegar. Tahini. Hummus. Tomato sauce. Olives. Mushrooms. ? Limit these  Grains: Prepackaged pasta or rice dishes. Prepackaged cereal with added sugar.  Vegetables: Deep fried potatoes (french fries).  Fruits: Fruit canned in syrup.  Meats and other protein foods: Beef. Pork. Lamb. Poultry with skin. Hot dogs. [place] Salines.  Dairy: Ice cream. Sour cream. Whole milk.  Beverages: Juice. Sugar-sweetened soft drinks. Beer. Liquor and spirits.  Fats and oils: Butter. Canola oil. Vegetable oil. Beef fat (tallow). Lard.  Sweets and desserts: Cookies. Cakes. Pies. Candy.  Seasoning and other foods: Mayonnaise. Premade sauces and marinades. The items listed may not be a complete list. Talk with your dietitian about what dietary choices are right for you. Summary  The Mediterranean diet includes both food and lifestyle choices.  Eat a variety of fresh fruits and vegetables, beans, nuts, seeds, and whole grains.  Limit the amount of red meat and sweets that you eat.  Talk with your health care provider about whether it is safe for you to drink red wine in moderation. This means  1 glass a day for nonpregnant women and 2 glasses a day for men. A glass of wine equals 5 oz (150 mL). This information is not intended to replace advice given to you by your health care provider. Make sure you discuss any questions you have with your health care provider. Document Revised: 11/09/2015 Document Reviewed: 11/02/2015 Elsevier Patient Education  [place].

## 2019-08-02 NOTE — Progress Notes (Signed)
Cardiology Office Note:    Date:  08/02/2019   ID:  Aaron Dyer, DOB 1965-12-04, MRN [id number]  PCP:  Aaron Lukes, MD  Cardiologist:  Aaron Dresser, MD PhD  Referring MD: Aaron Dyer   CC: follow up  History of Present Illness:    Aaron Dyer is a 54 y.o. male with a hx of hyperlipidemia who is seen in follow up today. He was seen on 04/14/18 as a new consult (last seen by Aaron Dyer in 2011-06-29) at the request of Saguier, Percell Miller, PA-C for the evaluation and management of dyspnea on exertion, chest pain, family history.  Pertinent cardiac history:  Had shooting chest pain, sharp, nonexertional. Also has dyspnea on exertion. Has history of hyperlipidemia with multiple statin intolerances, never smoker, no hypertension, no diabetes. Family history: father died at age 39 from cancer, first blockages in his 47s. Mom had 3V CABG and stents in June 29, 2006, passed away from dementia in 06-28-2016. Both parents had strokes. Both were smokers. Paternal uncle passed from MI in his 96s. Grandparents had strokes.   Today: Most recent direct LDL 193. We have discussed PCSK9i in the past. He is now no longer on ezetimibe or fenofibrate.  BP remains elevated in the AM but well controlled in the PM. PM numbers 119-138/79-93. Most 120s/80s. AM blood pressure 124-163/78-101. Most 140s  Took two shots with the repatha in the past and then stopped. Said he didn't like injections.  Now no longer has prescription coverage insurance with Medicare.   Discussed NSAIDs and meloxicam. Reviewed risk of MI with these medications.   Discussed aspirin recommendations. Did bruise easily.   Denies chest pain, shortness of breath at rest or with normal exertion. No PND, orthopnea, LE edema or unexpected weight gain. No syncope or palpitations. Still with occasional night sweats, not as bad as before.   Past Medical History:  Diagnosis Date  . Anxiety and depression   . Arthritis    neck  .  Diverticulosis of colon without hemorrhage 12/18/2014  . ED (erectile dysfunction)   . GERD (gastroesophageal reflux disease)   . History of adenomatous polyp of colon   . History of adenomatous polyp of colon   . History of exercise stress test 06/09/2006   ECHO ETT-- NORMAL  . History of melanoma excision    nonmalignant and malignant   . History of nonmelanoma skin cancer dermatologist-  dr williford   multiple areas on body,  SCC majority  . Hyperlipidemia   . IBS (irritable bowel syndrome)   . Prolapsed internal hemorrhoids, grade 4     Past Surgical History:  Procedure Laterality Date  . COLONOSCOPY  last one 11-10-2014  dr pyrtle  . EVALUATION UNDER ANESTHESIA WITH HEMORRHOIDECTOMY N/A 10/17/2017   Procedure: EXAM UNDER ANESTHESIA WITH HEMORRHOIDECTOMY AND HEMORRHOIDAL LIGATION/PEXY TIMES TWO;  Surgeon: Michael Boston, MD;  Location: [facility];  Service: General;  Laterality: N/A;  . EXCISION SEBACEOUS CYST  10-18-2013   @[facility]    ON BACK  . FLAP CLOSURE LEFT CHEEK POST WLE  09-18-2012    @[facility]   . MOHS SURGERY  X2 06/28/08   right ear  . WIDE LOCAL EXCISION LEFT CHEEK AND LEFT CERVICAL SENITNAL LYMPH NODE BX  09-07-2012    @[facility]     Current Medications: Current Outpatient Medications on File Prior to Visit  Medication Sig  . buPROPion (WELLBUTRIN XL) 300 MG 24 hr tablet Take 1 tablet by mouth once daily  . cetirizine (ZYRTEC)  10 MG tablet TAKE 1 TABLET BY MOUTH AT BEDTIME AS NEEDED FOR ALLERGIES  . cyclobenzaprine (FLEXERIL) 10 MG tablet Take 0.5-1 tablets (5-10 mg total) by mouth 3 (three) times daily as needed for muscle spasms.  . famotidine (PEPCID) 40 MG tablet Take 1 tablet by mouth once daily  . FLUoxetine (PROZAC) 20 MG capsule Take 1 capsule by mouth once daily  . fluticasone (FLONASE) 50 MCG/ACT nasal spray Place 2 sprays into both nostrils daily as needed.  Marland Kitchen losartan-hydrochlorothiazide (HYZAAR) 100-12.5 MG tablet Take 1 tablet by mouth daily.  .  meloxicam (MOBIC) 15 MG tablet Take 1 tablet by mouth once daily   No current facility-administered medications on file prior to visit.     Allergies:   Crestor [rosuvastatin]   Social History   Tobacco Use  . Smoking status: Never Smoker  . Smokeless tobacco: Never Used  Substance Use Topics  . Alcohol use: Yes    Alcohol/week: 0.0 standard drinks    Comment: OCCASIONAL  . Drug use: No    Family History: The patient's family history includes Alcohol abuse in his father; COPD in his mother; Cancer in his father; Diabetes in his maternal grandmother and mother; Emphysema in his mother; Heart attack in his maternal grandfather; Heart disease in his father, maternal grandmother, mother, paternal grandfather, and paternal grandmother; Hyperlipidemia in his mother; Hypertension in his father and mother; Stroke in his father and mother. There is no history of Colon cancer or Colon polyps.  ROS:   Please see the history of present illness.  Additional pertinent ROS otherwise unremarkable.   EKGs/Labs/Other Studies Reviewed:    The following studies were reviewed today: CT coronary 05/25/18 Coronary Arteries:  Normal coronary origin.  Right dominance.  RCA is a non-dominant artery that has minimal soft plaque in the mid vessel.  Left main is a large artery that gives rise to LAD and LCX arteries.  LAD is a large vessel that has 30-40% mixed plaque in the proximal and mid regions and 10-20% calcified plaque distally. There are 4 small diagonal vessels. There is 20-30% calcified plaque at the ostium of D1, and the diagonals are otherwise without significant disease.  LCX is a large, dominant artery that gives rise to three OM branches and the PDA. There is 10-20% calcified plaque in the mid LCX at the level of OM1.  IMPRESSION: Coronary calcium score of 192. This was 90th percentile for age and sex matched control.  Echo 04/21/18 - Left ventricle: The cavity size was  normal. Systolic function was   normal. The estimated ejection fraction was in the range of 55%   to 60%. Wall motion was normal; there were no regional wall   motion abnormalities. Doppler parameters are consistent with   abnormal left ventricular relaxation (grade 1 diastolic   dysfunction).  ETT 2013: 13 min, 15.4 METs, Peak HR 160 bpm, no ischemia on ECG  Stress echo 2008: 9 min 32 sec on Bruce Peak HR 172 bpm 15 METs No ECG changes Normal baseline echo, appropriate augmentation with stress   EKG:  EKG is personally reviewed.  The ekg ordered 04/14/18 demonstrates normal sinus rhythm  Recent Labs: 06/10/2019: ALT 26; BUN 16; Creatinine, Ser 1.02; Hemoglobin 14.8; Platelets 280.0; Potassium 5.2; Sodium 137  Recent Lipid Panel    Component Value Date/Time   CHOL 269 (H) 06/10/2019 1442   TRIG 203.0 (H) 06/10/2019 1442   HDL 37.50 (L) 06/10/2019 1442   CHOLHDL 7 06/10/2019 1442  VLDL 40.6 (H) 06/10/2019 1442   LDLCALC 182 (H) 04/07/2018 1814   LDLDIRECT 193.0 06/10/2019 1442    Physical Exam:    VS:  BP (!) 140/98   Pulse 81   Temp 97.7 F (36.5 C)   Ht 5\' 9"  (1.753 m)   Wt 164 lb 9.6 oz (74.7 kg)   SpO2 98%   BMI 24.31 kg/m     Wt Readings from Last 3 Encounters:  07/20/19 171 lb 1.6 oz (77.6 kg)  06/10/19 171 lb (77.6 kg)  03/24/19 167 lb (75.8 kg)    GEN: Well nourished, well developed in no acute distress HEENT: Normal, moist mucous membranes NECK: No JVD CARDIAC: regular rhythm, normal S1 and S2, no rubs or gallops. No murmur. VASCULAR: Radial and DP pulses 2+ bilaterally. No carotid bruits RESPIRATORY:  Clear to auscultation without rales, wheezing or rhonchi  ABDOMEN: Soft, non-tender, non-distended MUSCULOSKELETAL:  Ambulates independently SKIN: Warm and dry, no edema NEUROLOGIC:  Alert and oriented x 3. No focal neuro deficits noted. PSYCHIATRIC:  Normal affect   ASSESSMENT:    1. Nonocclusive coronary atherosclerosis of native coronary artery    2. Mixed hyperlipidemia   3. Counseling on health promotion and disease prevention   4. Nutritional counseling   5. Exercise counseling   6. Essential hypertension    PLAN:    Coronary artery disease: seen on CT coronary angiography, nonocclusive -secondary prevention discussed, see below. Focused extensively on diet and exercise today -recommended starting aspirin 81 mg, discussed again today -has not tolerated multiple statins, below.  -discussed risk of NSAIDs in CAD (meloxicam)  Hyperlipidemia, mixed Statin intolerance:  -tried on rosuvastatin (DC 2014), simvastatin (DC 2016), pitavastatin (DC 2014), lovastatin (DC 2013), had cramps on all. Does not believe he was fibrate at the same time as the statin--from my review, appears he has been on fenofibrate since at least 2017. Given his LDL of 182 and TG of 187 on therapy, I do not think ezetimibe and fenofibrate will be adequate for him.  -we have discussed PCSK9i in the past. He tried, no adverse reactions, but did not like injections -on review of his prior lipid panels, he has had LDL >200. 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 This fits with his family history TG have also been elevated in the past, though not clear which are fasting/nonfasting, but some >500 -we discussed options today, and he will discuss further with PharmD lipid clinic -lipids 06/10/19: Tchol 269, TG 203, HDL 37, LDL 193. Goal LDL <70  Hypertension: -goal <130/80 -elevated on AM reads and in office, but PM readings at home well controlled -continue losartan-HCTZ -if home numbers rise, will need to intensify therapy  Secondary Prevention -recommend heart healthy/Mediterranean diet, with whole grains, fruits, vegetable, fish, lean meats, nuts, and olive oil. Limit salt. -recommend moderate walking, 3-5 times/week for 30-50 minutes each session. Aim for at least 150 minutes.week. Goal should be pace of 3 miles/hours, or walking  1.5 miles in 30 minutes -recommend avoidance of tobacco products. Avoid excess alcohol. -Additional risk factor control:  -Diabetes: A1c is 5.6, monitor  -Weight: BMI 24  Plan for follow up: 1 year or sooner PRN  Aaron Dresser, MD, PhD [place]  [facility]   Medication Adjustments/Labs and Tests Ordered: Current medicines are reviewed at length with the patient today.  Concerns regarding medicines are outlined above.  No orders of the defined types were placed in this encounter.  No orders of the defined types  were placed in this encounter.   Patient Instructions  Medication Instructions:  Your Physician recommend you continue on your current medication as directed.    *If you need a refill on your cardiac medications before your next appointment, please call your pharmacy*   Lab Work: None   Testing/Procedures: None   Follow-Up: At [facility], you and your health needs are our priority.  As part of our continuing mission to provide you with exceptional heart care, we have created designated Provider Care Teams.  These Care Teams include your primary Cardiologist (physician) and Advanced Practice Providers (APPs -  Physician Assistants and Nurse Practitioners) who all work together to provide you with the care you need, when you need it.  We recommend signing up for the patient portal called "MyChart".  Sign up information is provided on this After Visit Summary.  MyChart is used to connect with patients for Virtual Visits (Telemedicine).  Patients are able to view lab/test results, encounter notes, upcoming appointments, etc.  Non-urgent messages can be sent to your provider as well.   To learn more about what you can do with MyChart, go to [email or web address].    Your next appointment:   1 year(s)  The format for your next appointment:   In Person  Provider:   Buford Dresser, MD   Your physician recommends that you schedule a follow-up  appointment with Lipid Clinic ( Pharm D) first available.     Mediterranean Diet A Mediterranean diet refers to food and lifestyle choices that are based on the traditions of countries located on the [employer]. This way of eating has been shown to help prevent certain conditions and improve outcomes for people who have chronic diseases, like kidney disease and heart disease. What are tips for following this plan? Lifestyle  Cook and eat meals together with your family, when possible.  Drink enough fluid to keep your urine clear or pale yellow.  Be physically active every day. This includes: ? Aerobic exercise like running or swimming. ? Leisure activities like gardening, walking, or housework.  Get 7-8 hours of sleep each night.  If recommended by your health care provider, drink red wine in moderation. This means 1 glass a day for nonpregnant women and 2 glasses a day for men. A glass of wine equals 5 oz (150 mL). Reading food labels   Check the serving size of packaged foods. For foods such as rice and pasta, the serving size refers to the amount of cooked product, not dry.  Check the total fat in packaged foods. Avoid foods that have saturated fat or trans fats.  Check the ingredients list for added sugars, such as corn syrup. Shopping  At the grocery store, buy most of your food from the areas near the walls of the store. This includes: ? Fresh fruits and vegetables (produce). ? Grains, beans, nuts, and seeds. Some of these may be available in unpackaged forms or large amounts (in bulk). ? Fresh seafood. ? Poultry and eggs. ? Low-fat dairy products.  Buy whole ingredients instead of prepackaged foods.  Buy fresh fruits and vegetables in-season from local farmers markets.  Buy frozen fruits and vegetables in resealable bags.  If you do not have access to quality fresh seafood, buy precooked frozen shrimp or canned fish, such as tuna, salmon, or sardines.  Buy  small amounts of raw or cooked vegetables, salads, or olives from the deli or salad bar at your store.  Stock Conservator, museum/gallery so you  always have certain foods on hand, such as olive oil, canned tuna, canned tomatoes, rice, pasta, and beans. Cooking  Cook foods with extra-virgin olive oil instead of using butter or other vegetable oils.  Have meat as a side dish, and have vegetables or grains as your main dish. This means having meat in small portions or adding small amounts of meat to foods like pasta or stew.  Use beans or vegetables instead of meat in common dishes like chili or lasagna.  Experiment with different cooking methods. Try roasting or broiling vegetables instead of steaming or sauteing them.  Add frozen vegetables to soups, stews, pasta, or rice.  Add nuts or seeds for added healthy fat at each meal. You can add these to yogurt, salads, or vegetable dishes.  Marinate fish or vegetables using olive oil, lemon juice, garlic, and fresh herbs. Meal planning   Plan to eat 1 vegetarian meal one day each week. Try to work up to 2 vegetarian meals, if possible.  Eat seafood 2 or more times a week.  Have healthy snacks readily available, such as: ? Vegetable sticks with hummus. ? [place] yogurt. ? Fruit and nut trail mix.  Eat balanced meals throughout the week. This includes: ? Fruit: 2-3 servings a day ? Vegetables: 4-5 servings a day ? Low-fat dairy: 2 servings a day ? Fish, poultry, or lean meat: 1 serving a day ? Beans and legumes: 2 or more servings a week ? Nuts and seeds: 1-2 servings a day ? Whole grains: 6-8 servings a day ? Extra-virgin olive oil: 3-4 servings a day  Limit red meat and sweets to only a few servings a month What are my food choices?  Mediterranean diet ? Recommended  Grains: Whole-grain pasta. Brown rice. Bulgar wheat. Polenta. Couscous. Whole-wheat bread. [place].  Vegetables: Artichokes. Beets. Broccoli. Cabbage. Carrots. Eggplant.  Green beans. Chard. Kale. Spinach. Onions. Leeks. Peas. Squash. Tomatoes. Peppers. Radishes.  Fruits: Apples. Apricots. Avocado. Berries. Bananas. Cherries. Dates. Figs. Grapes. Lemons. Melon. Oranges. Peaches. Plums. Pomegranate.  Meats and other protein foods: Beans. Almonds. Sunflower seeds. Pine nuts. Peanuts. [place]. Salmon. Scallops. Shrimp. [place]. Tilapia. Clams. Oysters. Eggs.  Dairy: Low-fat milk. Cheese. Greek yogurt.  Beverages: Water. Red wine. Herbal tea.  Fats and oils: Extra virgin olive oil. Avocado oil. Grape seed oil.  Sweets and desserts: [place] yogurt with honey. Baked apples. Poached pears. Trail mix.  Seasoning and other foods: Basil. Cilantro. Coriander. Cumin. Mint. Parsley. Sage. Rosemary. Tarragon. Garlic. Oregano. Thyme. Pepper. Balsalmic vinegar. Tahini. Hummus. Tomato sauce. Olives. Mushrooms. ? Limit these  Grains: Prepackaged pasta or rice dishes. Prepackaged cereal with added sugar.  Vegetables: Deep fried potatoes (french fries).  Fruits: Fruit canned in syrup.  Meats and other protein foods: Beef. Pork. Lamb. Poultry with skin. Hot dogs. Berniece Salines.  Dairy: Ice cream. Sour cream. Whole milk.  Beverages: Juice. Sugar-sweetened soft drinks. Beer. Liquor and spirits.  Fats and oils: Butter. Canola oil. Vegetable oil. Beef fat (tallow). Lard.  Sweets and desserts: Cookies. Cakes. Pies. Candy.  Seasoning and other foods: Mayonnaise. Premade sauces and marinades. The items listed may not be a complete list. Talk with your dietitian about what dietary choices are right for you. Summary  The Mediterranean diet includes both food and lifestyle choices.  Eat a variety of fresh fruits and vegetables, beans, nuts, seeds, and whole grains.  Limit the amount of red meat and sweets that you eat.  Talk with your health care provider about whether it is safe  for you to drink red wine in moderation. This means 1 glass a day for nonpregnant women and 2 glasses a day  for men. A glass of wine equals 5 oz (150 mL). This information is not intended to replace advice given to you by your health care provider. Make sure you discuss any questions you have with your health care provider. Document Revised: 11/09/2015 Document Reviewed: 11/02/2015 Elsevier Patient Education  2020 [employer].     Signed, Aaron Dresser, MD PhD 08/02/2019   Maish Vaya

## 2019-08-03 ENCOUNTER — Telehealth: Payer: Medicare Other | Admitting: Medical

## 2019-08-10 ENCOUNTER — Ambulatory Visit (INDEPENDENT_AMBULATORY_CARE_PROVIDER_SITE_OTHER): Payer: Medicare Other | Admitting: Pharmacist

## 2019-08-10 ENCOUNTER — Other Ambulatory Visit: Payer: Self-pay | Admitting: Family Medicine

## 2019-08-10 ENCOUNTER — Other Ambulatory Visit: Payer: Self-pay

## 2019-08-10 DIAGNOSIS — E782 Mixed hyperlipidemia: Secondary | ICD-10-CM | POA: Diagnosis not present

## 2019-08-10 DIAGNOSIS — I2510 Atherosclerotic heart disease of native coronary artery without angina pectoris: Secondary | ICD-10-CM

## 2019-08-10 MED ORDER — FENOFIBRATE 54 MG PO TABS: 54.0000 mg | ORAL_TABLET | Freq: Every day | ORAL | 6 refills | Status: DC

## 2019-08-10 NOTE — Patient Instructions (Addendum)
Your Results:             Your most recent labs Goal  Total Cholesterol 269 < 200  Triglycerides 203 < 150  HDL (good cholesterol) 37.5 > 40  LDL (bad cholesterol 193 < 70      Medication changes: *START fenofibrate 54mg  daily *START Repatha 140mg  every 14 days   Lab orders: 3 months after start Repatha  Thank you for choosing [facility]

## 2019-08-10 NOTE — Progress Notes (Signed)
Patient ID: THOR BRENN                 DOB: 1966-01-20                    MRN: [id number]     HPI: Aaron Dyer is a 54 y.o. male patient referred to lipid clinic by Dr Harrell Gave. PMH is significant for hyperlipidemia, chest pain, lipoma, and family hx of ASCVD. Noted coronary calcium score at 90th percentile for age and sex matched control. Patient was approved for repatha 140mg  on 06/2018,but stopped using after 2 doses because he doesn't like injections.  Here to discuss potential re-initiation and option for monthly infusion. Medicare approved, but no drug coverage.  Patient also stopped taking fenofibrate , but not sure of reason to stop.  Current Medications: none  Intolerances:  Rosuvastatin - severe cramps Simvastatin - cramps pitavastatin  Fenofibrate - unknown reason to stop Repatha - he didn't like injections  LDL goal: < 70mg /dL  Diet: burger and tacos, lots of red meat and take out  Exercise: landscape 5x/week  Family History: The patient's family history includes Alcohol abuse in his father; COPD in his mother; Cancer in his father; Diabetes in his maternal grandmother and mother; Emphysema in his mother; Heart attack in his maternal grandfather; Heart disease in his father, maternal grandmother, mother, paternal grandfather, and paternal grandmother; Hyperlipidemia in his mother; Hypertension in his father and mother; Stroke in his father and mother. There is no history of Colon cancer or Colon polyps.  Social History: occasional alcohol, denies tobacco use  Labs: 06/11/2019" CHO 269; TG 203; HDL 37.5; LDL-direct 193 (no therapy)  Past Medical History:  Diagnosis Date  . Anxiety and depression   . Arthritis    neck  . Diverticulosis of colon without hemorrhage 12/18/2014  . ED (erectile dysfunction)   . GERD (gastroesophageal reflux disease)   . History of adenomatous polyp of colon   . History of adenomatous polyp of colon   . History of exercise stress test  06/09/2006   ECHO ETT-- NORMAL  . History of melanoma excision    nonmalignant and malignant   . History of nonmelanoma skin cancer dermatologist-  dr williford   multiple areas on body,  SCC majority  . Hyperlipidemia   . IBS (irritable bowel syndrome)   . Prolapsed internal hemorrhoids, grade 4     Current Outpatient Medications on File Prior to Visit  Medication Sig Dispense Refill  . buPROPion (WELLBUTRIN XL) 300 MG 24 hr tablet Take 1 tablet by mouth once daily 90 tablet 0  . cyclobenzaprine (FLEXERIL) 10 MG tablet Take 0.5-1 tablets (5-10 mg total) by mouth 3 (three) times daily as needed for muscle spasms. 21 tablet 0  . FLUoxetine (PROZAC) 20 MG capsule Take 1 capsule by mouth once daily 90 capsule 0  . fluticasone (FLONASE) 50 MCG/ACT nasal spray Place 2 sprays into both nostrils daily as needed. 16 g 3  . losartan-hydrochlorothiazide (HYZAAR) 100-12.5 MG tablet Take 1 tablet by mouth daily. 30 tablet 3  . meloxicam (MOBIC) 15 MG tablet Take 1 tablet by mouth once daily 30 tablet 0   No current facility-administered medications on file prior to visit.    Allergies  Allergen Reactions  . Crestor [Rosuvastatin] Other (See Comments)    myalgias    Hyperlipidemia LDL remains above goal for secondary prevention. Patient stopped Repatha therapy in the past ,but denies adverse reaction to the medication. We  discussed available options of low dose rosuvastatin, Nexletol (high cost and no patient assistance yet), Repatha monthly infusion, and Repatha/Praluent every 14days, patient opted for Repatha SureClick 140mg  again. Two samples were provided during office visit for home use, and [employer] safety net paperwork provided to complete and return to office ASAP.   New Rx for fenofibrate 54mg  sent to pharmacy. Patient will be able to afford this formulation by using GoodRx. Plan to repeat fasting lipid panel in 3-6 months.   Raquel Rodriguez-Guzman PharmD, BCPS, [place] [address] 08/13/2019 5:26 PM

## 2019-08-13 ENCOUNTER — Encounter: Payer: Self-pay | Admitting: Pharmacist

## 2019-08-13 NOTE — Assessment & Plan Note (Signed)
LDL remains above goal for secondary prevention. Patient stopped Repatha therapy in the past ,but denies adverse reaction to the medication. We discussed available options of low dose rosuvastatin, Nexletol (high cost and no patient assistance yet), Repatha monthly infusion, and Repatha/Praluent every 14days, patient opted for Repatha SureClick 140mg  again. Two samples were provided during office visit for home use, and [employer] safety net paperwork provided to complete and return to office ASAP.   New Rx for fenofibrate 54mg  sent to pharmacy. Patient will be able to afford this formulation by using GoodRx. Plan to repeat fasting lipid panel in 3-6 months.

## 2019-09-08 ENCOUNTER — Other Ambulatory Visit: Payer: Self-pay | Admitting: *Deleted

## 2019-09-08 ENCOUNTER — Telehealth: Payer: Self-pay | Admitting: Cardiology

## 2019-09-08 MED ORDER — MELOXICAM 15 MG PO TABS: 15.0000 mg | ORAL_TABLET | Freq: Every day | ORAL | 0 refills | Status: DC

## 2019-09-08 NOTE — Telephone Encounter (Signed)
Sharyn Lull the patient's wife is calling in regards to the patient's Repatha paperwork due to not hearing anything in regards to it since turning it in to the office and him needing a refill soon. Please advise.

## 2019-09-08 NOTE — Telephone Encounter (Signed)
Are we aware of anything regarding this paperwork?  Will route to PharmD and [place]

## 2019-09-08 NOTE — Telephone Encounter (Signed)
Returned a call to the pt and lmomed them with the information to call the [employer]

## 2019-09-17 NOTE — Telephone Encounter (Signed)
Follow up  Pt's wife returning call from [place]. She said the VM was cut off and didn't get the entire number for [employer]

## 2019-09-20 NOTE — Telephone Encounter (Signed)
Returned a call to pt's wife  lmomed them to call back

## 2019-09-20 NOTE — Telephone Encounter (Signed)
Called and lmomed the pt and instructed them to contact the [facility] to see if they have more information that they need to obtain before processing the request

## 2019-09-21 ENCOUNTER — Encounter: Payer: Self-pay | Admitting: Cardiology

## 2019-09-21 DIAGNOSIS — I2510 Atherosclerotic heart disease of native coronary artery without angina pectoris: Secondary | ICD-10-CM | POA: Insufficient documentation

## 2019-09-21 DIAGNOSIS — I10 Essential (primary) hypertension: Secondary | ICD-10-CM | POA: Insufficient documentation

## 2019-10-10 ENCOUNTER — Other Ambulatory Visit: Payer: Self-pay | Admitting: Family Medicine

## 2019-10-13 ENCOUNTER — Other Ambulatory Visit: Payer: Self-pay | Admitting: Family Medicine

## 2019-10-15 ENCOUNTER — Other Ambulatory Visit: Payer: Self-pay | Admitting: Family Medicine

## 2019-10-25 ENCOUNTER — Other Ambulatory Visit: Payer: Self-pay | Admitting: Family Medicine

## 2019-11-13 ENCOUNTER — Other Ambulatory Visit: Payer: Self-pay | Admitting: Medical

## 2019-11-14 ENCOUNTER — Other Ambulatory Visit: Payer: Self-pay | Admitting: Family Medicine

## 2019-12-14 ENCOUNTER — Other Ambulatory Visit: Payer: Self-pay | Admitting: Family Medicine

## 2019-12-14 ENCOUNTER — Other Ambulatory Visit: Payer: Self-pay | Admitting: Medical

## 2020-01-17 ENCOUNTER — Other Ambulatory Visit: Payer: Self-pay | Admitting: Family Medicine

## 2020-01-21 IMAGING — US US ABDOMEN COMPLETE
1 series · 13 of 25 positions shown · non-contrast
Comparison: None.

CLINICAL DATA: Abdominal pain.  History of melanoma

EXAM:
ABDOMEN ULTRASOUND COMPLETE

[Series 1: us abdomen complete · 0.18mm/px · 13 of 143 slices shown]
[im 1/143]
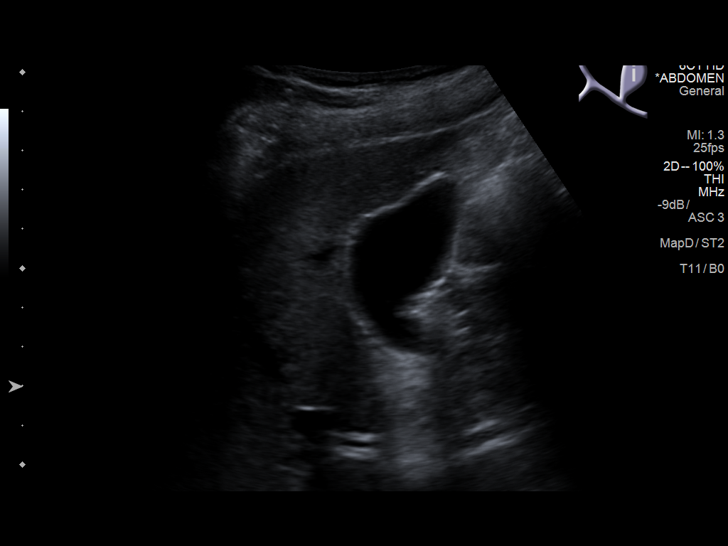
[im 12/143]
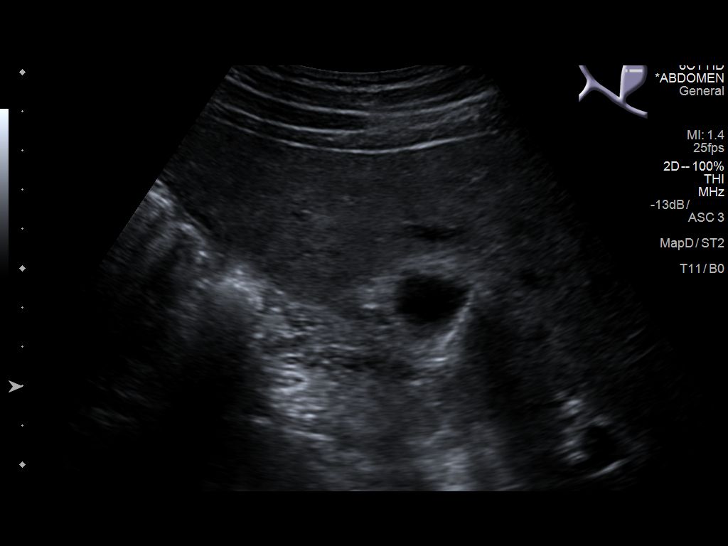
[im 24/143]
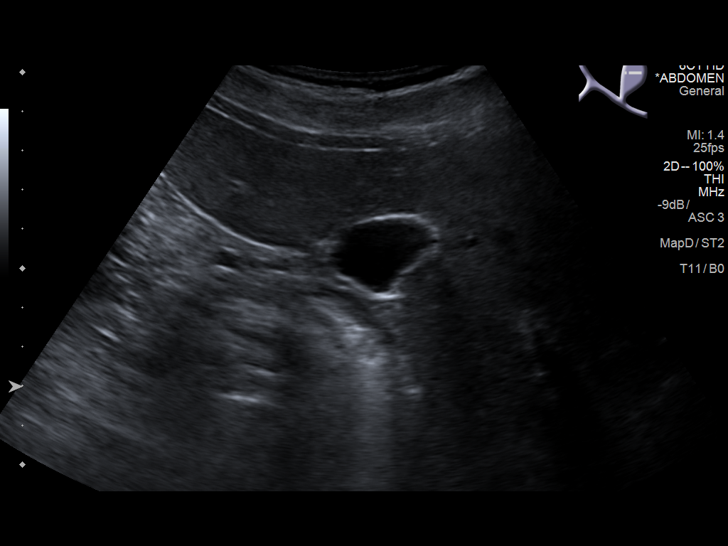
[im 36/143]
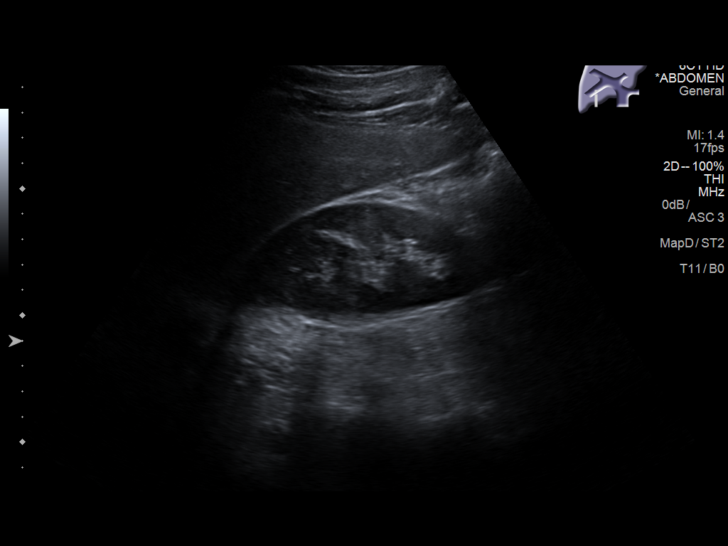
[im 48/143]
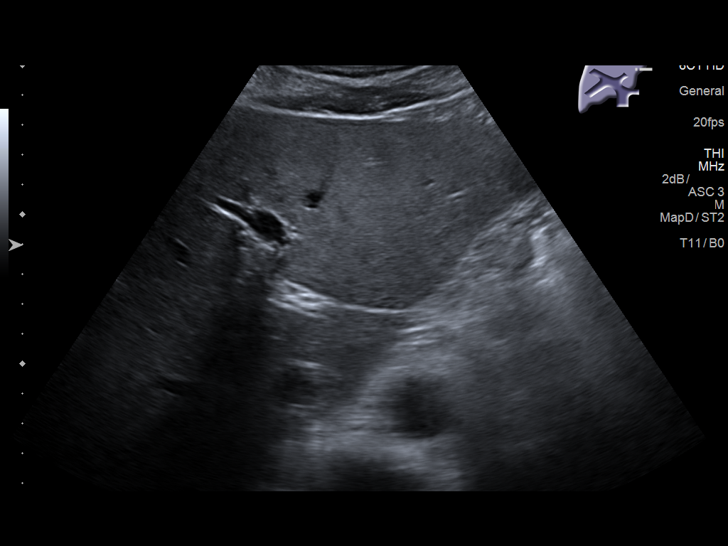
[im 60/143]
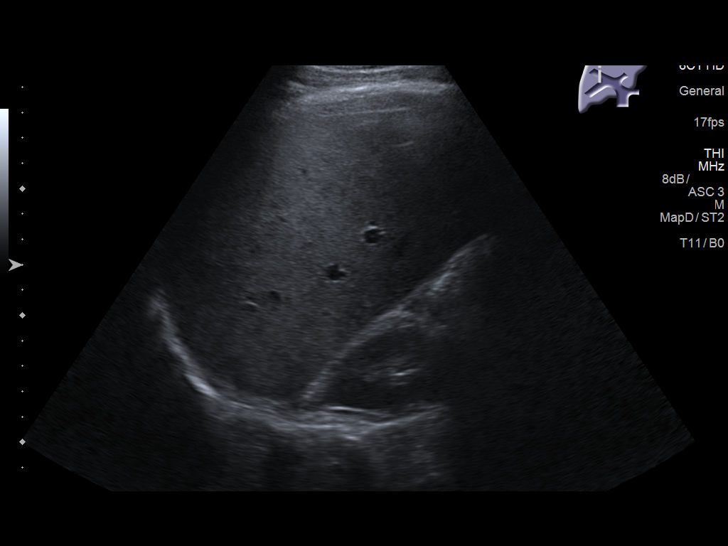
[im 72/143]
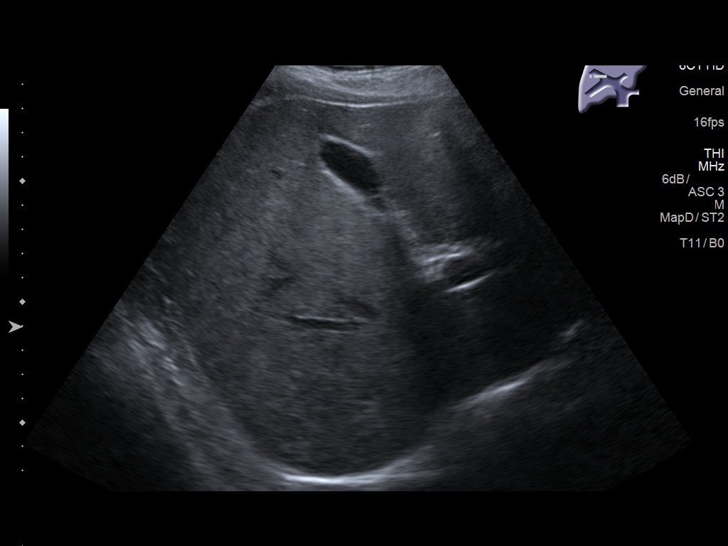
[im 83/143]
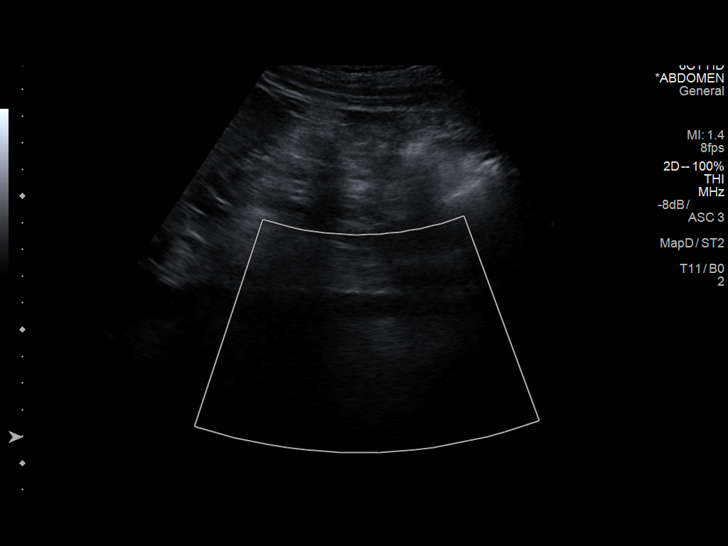
[im 95/143]
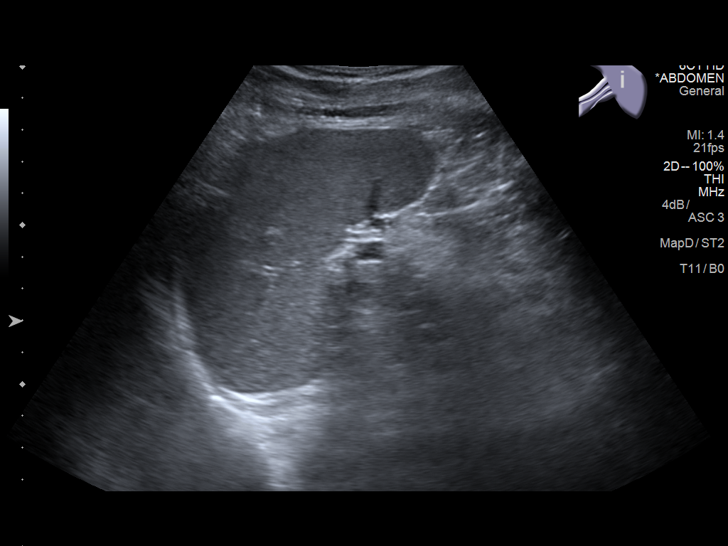
[im 107/143]
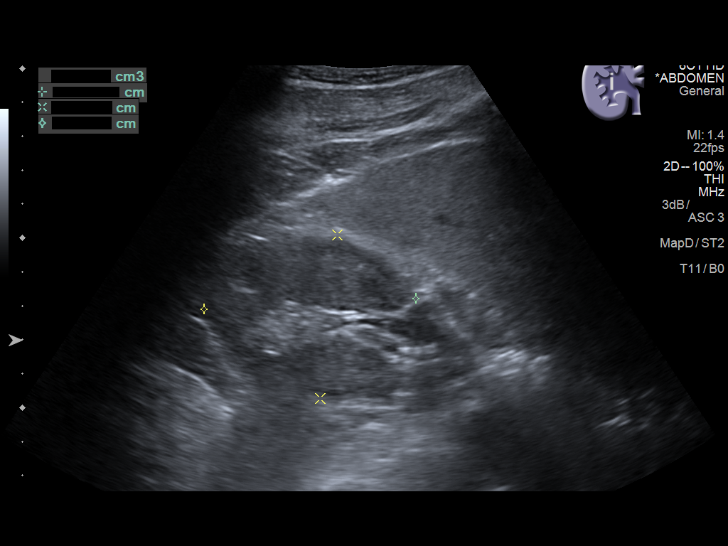
[im 119/143]
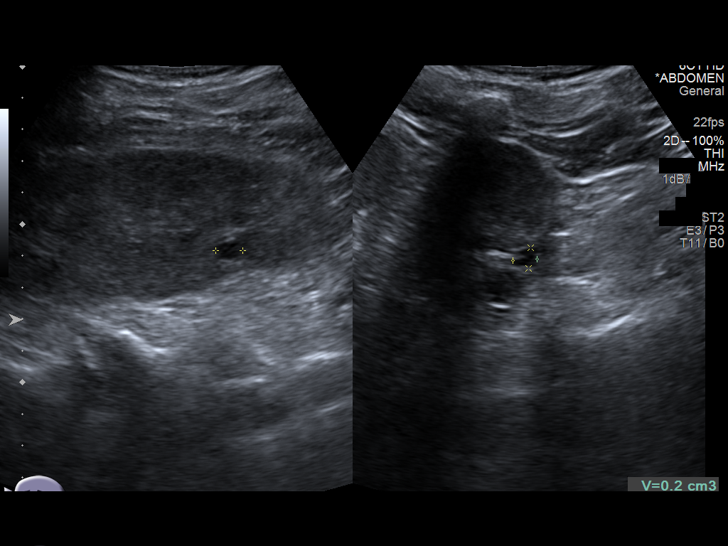
[im 131/143]
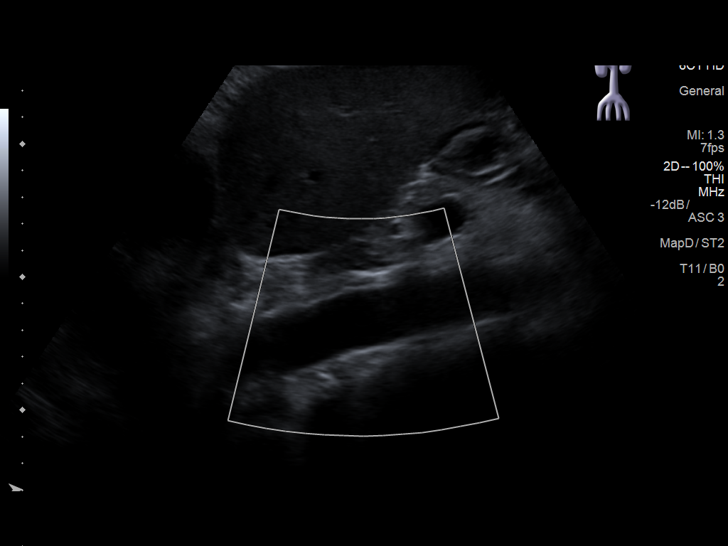
[im 143/143]
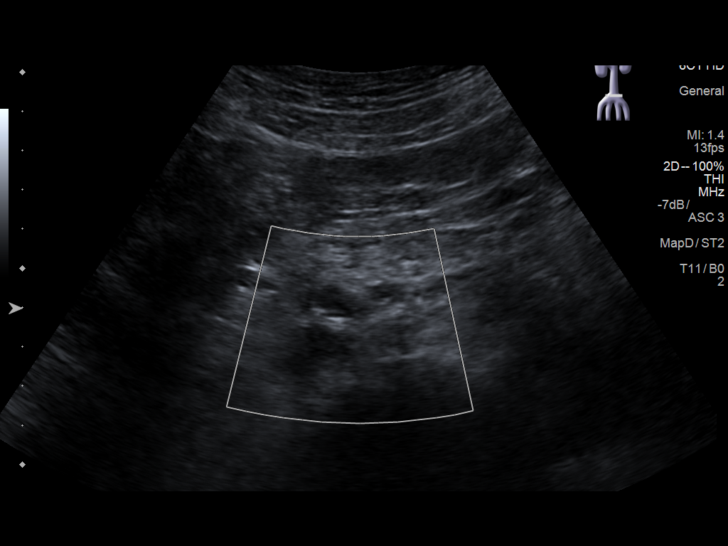

[13 of 25 positions shown; findings below may reference images not displayed]

FINDINGS: Gallbladder: There are two 4 mm echogenic foci in the neck of the
gallbladder which neither move nor shadow, likely polyps. There are
no echogenic foci in the gallbladder which move and shadow as is
expected with gallstones. No gallbladder wall thickening or
pericholecystic fluid. No sonographic Murphy sign noted by
sonographer.

Common bile duct: Diameter: 5 mm. No intrahepatic, common hepatic,
or common bile duct dilatation.

Liver: There is an echogenic focus in the left lobe of the liver
measuring 1.0 x 1.0 x 1.1 cm. There is a echogenic focus with a
hypoechoic central reason in the posterior segment right lobe of
liver measuring 2.1 x 1.8 x 2.7 cm. Within normal limits in
parenchymal echogenicity. Portal vein is patent on color Doppler
imaging with normal direction of blood flow towards the liver.

IVC: No abnormality visualized.

Pancreas: No pancreatic mass or inflammatory focus.

Spleen: Size and appearance within normal limits.

Right Kidney: Length: 11.7 cm. Echogenicity within normal limits. No
mass or hydronephrosis visualized.

Left Kidney: Length: 11.3 cm. Echogenicity within normal limits. No
hydronephrosis visualized. There is a cyst in the lower pole left
kidney measuring 0.9 x 0.7 x 0.8 cm.

Abdominal aorta: No aneurysm visualized. There is aortic
atherosclerosis.

Other findings: No demonstrable ascites..
IMPRESSION: 1. There are 2 echogenic foci in the liver, the larger lesion
measuring 2.1 x 1.8 x 2.7 cm. This lesion is predominantly
hyperechoic but has a less echoic central area. This lesion does not
have classic appearance of hemangioma. The smaller lesion is
uniformly hyperechoic, more typical for hemangioma. Given history of
melanoma, these findings warrant further assessment with either pre
and serial post-contrast MR or CT of the liver to further assess.

2. 4 mm non moving and nonshadowing echogenic foci in the neck of
the gallbladder, likely polyps. Typically, small polyps of this
nature do not warrant continued imaging surveillance. However, given
history of melanoma and known propensity for melanoma to metastasize
to gallbladder, a follow-up ultrasound of the gallbladder in 6-12
months to assess for stability may be reasonable. Gallbladder
otherwise appears unremarkable.

3.  Small cyst in lower pole left kidney.

4.  Aortic atherosclerosis.

Aortic Atherosclerosis ([id number]).

These results will be called to the ordering clinician or
representative by the Radiologist Assistant, and communication
documented in the PACS or zVision Dashboard.

## 2020-01-27 ENCOUNTER — Other Ambulatory Visit: Payer: Self-pay | Admitting: Family Medicine

## 2020-02-09 ENCOUNTER — Other Ambulatory Visit: Payer: Self-pay | Admitting: Family Medicine

## 2020-02-09 DIAGNOSIS — W57XXXA Bitten or stung by nonvenomous insect and other nonvenomous arthropods, initial encounter: Secondary | ICD-10-CM

## 2020-02-09 DIAGNOSIS — K649 Unspecified hemorrhoids: Secondary | ICD-10-CM

## 2020-02-09 DIAGNOSIS — D72829 Elevated white blood cell count, unspecified: Secondary | ICD-10-CM

## 2020-02-09 DIAGNOSIS — F418 Other specified anxiety disorders: Secondary | ICD-10-CM

## 2020-02-09 DIAGNOSIS — E785 Hyperlipidemia, unspecified: Secondary | ICD-10-CM

## 2020-02-09 DIAGNOSIS — M545 Low back pain, unspecified: Secondary | ICD-10-CM

## 2020-02-09 DIAGNOSIS — F32A Depression, unspecified: Secondary | ICD-10-CM

## 2020-02-09 DIAGNOSIS — S0101XA Laceration without foreign body of scalp, initial encounter: Secondary | ICD-10-CM

## 2020-02-21 DIAGNOSIS — Z1283 Encounter for screening for malignant neoplasm of skin: Secondary | ICD-10-CM | POA: Diagnosis not present

## 2020-02-21 DIAGNOSIS — D229 Melanocytic nevi, unspecified: Secondary | ICD-10-CM | POA: Diagnosis not present

## 2020-02-21 DIAGNOSIS — Z85820 Personal history of malignant melanoma of skin: Secondary | ICD-10-CM | POA: Diagnosis not present

## 2020-02-21 DIAGNOSIS — L821 Other seborrheic keratosis: Secondary | ICD-10-CM | POA: Diagnosis not present

## 2020-02-21 DIAGNOSIS — L814 Other melanin hyperpigmentation: Secondary | ICD-10-CM | POA: Diagnosis not present

## 2020-02-21 DIAGNOSIS — L570 Actinic keratosis: Secondary | ICD-10-CM | POA: Diagnosis not present

## 2020-02-21 DIAGNOSIS — D489 Neoplasm of uncertain behavior, unspecified: Secondary | ICD-10-CM | POA: Diagnosis not present

## 2020-03-20 ENCOUNTER — Other Ambulatory Visit: Payer: Self-pay | Admitting: Cardiology

## 2020-04-17 ENCOUNTER — Other Ambulatory Visit: Payer: Self-pay | Admitting: Family Medicine

## 2020-04-19 ENCOUNTER — Ambulatory Visit (AMBULATORY_SURGERY_CENTER): Payer: Self-pay

## 2020-04-19 ENCOUNTER — Other Ambulatory Visit: Payer: Self-pay

## 2020-04-19 VITALS — Ht 69.0 in | Wt 174.0 lb

## 2020-04-19 DIAGNOSIS — Z86010 Personal history of colonic polyps: Secondary | ICD-10-CM

## 2020-04-19 DIAGNOSIS — Z01818 Encounter for other preprocedural examination: Secondary | ICD-10-CM

## 2020-04-19 MED ORDER — SUTAB 1479-225-188 MG PO TABS: 12.0000 | ORAL_TABLET | ORAL | 0 refills | Status: DC

## 2020-04-19 NOTE — Progress Notes (Signed)
No allergies to soy or egg Pt is not on blood thinners or diet pills Denies issues with sedation/intubation Denies atrial flutter/fib Denies constipation   Emmi instructions given to pt  Pt is aware of Covid safety and care partner requirements.  

## 2020-04-24 ENCOUNTER — Telehealth: Payer: Self-pay

## 2020-04-24 NOTE — Telephone Encounter (Signed)
Called and lmomed the pt that we will need new insurance card and that the healthwell grant is only fr people with health insurance and that it covers the extra cost not covered by insurance but that we need him to call us back asap so we can help him

## 2020-05-01 ENCOUNTER — Other Ambulatory Visit: Payer: Self-pay | Admitting: Internal Medicine

## 2020-05-01 DIAGNOSIS — Z1159 Encounter for screening for other viral diseases: Secondary | ICD-10-CM | POA: Diagnosis not present

## 2020-05-02 LAB — SARS CORONAVIRUS 2 (TAT 6-24 HRS): SARS Coronavirus 2: NEGATIVE

## 2020-05-03 ENCOUNTER — Encounter: Payer: Self-pay | Admitting: Internal Medicine

## 2020-05-03 ENCOUNTER — Ambulatory Visit (AMBULATORY_SURGERY_CENTER): Payer: Medicare Other | Admitting: Internal Medicine

## 2020-05-03 ENCOUNTER — Other Ambulatory Visit: Payer: Self-pay

## 2020-05-03 VITALS — BP 126/83 | HR 63 | Temp 98.0°F | Resp 21 | Ht 69.0 in | Wt 174.0 lb

## 2020-05-03 DIAGNOSIS — Z860100 Personal history of colon polyps, unspecified: Secondary | ICD-10-CM

## 2020-05-03 DIAGNOSIS — D124 Benign neoplasm of descending colon: Secondary | ICD-10-CM

## 2020-05-03 DIAGNOSIS — K635 Polyp of colon: Secondary | ICD-10-CM | POA: Diagnosis not present

## 2020-05-03 DIAGNOSIS — D123 Benign neoplasm of transverse colon: Secondary | ICD-10-CM

## 2020-05-03 DIAGNOSIS — Z1211 Encounter for screening for malignant neoplasm of colon: Secondary | ICD-10-CM | POA: Diagnosis not present

## 2020-05-03 MED ORDER — SODIUM CHLORIDE 0.9 % IV SOLN: 500.0000 mL | Freq: Once | INTRAVENOUS | Status: DC

## 2020-05-03 NOTE — Progress Notes (Signed)
Pt's states no medical or surgical changes since previsit or office visit.  VS taken by [facility]

## 2020-05-03 NOTE — Progress Notes (Signed)
Pt Drowsy. VSS. To PACU, report to RN. No anesthetic complications noted.  

## 2020-05-03 NOTE — Patient Instructions (Signed)
Discharge instructions given. Handouts on polyps and diverticulosis. Resume previous medications. YOU HAD AN ENDOSCOPIC PROCEDURE TODAY AT THE [facility]:   Refer to the procedure report that was given to you for any specific questions about what was found during the examination.  If the procedure report does not answer your questions, please call your gastroenterologist to clarify.  If you requested that your care partner not be given the details of your procedure findings, then the procedure report has been included in a sealed envelope for you to review at your convenience later.  YOU SHOULD EXPECT: Some feelings of bloating in the abdomen. Passage of more gas than usual.  Walking can help get rid of the air that was put into your GI tract during the procedure and reduce the bloating. If you had a lower endoscopy (such as a colonoscopy or flexible sigmoidoscopy) you may notice spotting of blood in your stool or on the toilet paper. If you underwent a bowel prep for your procedure, you may not have a normal bowel movement for a few days.  Please Note:  You might notice some irritation and congestion in your nose or some drainage.  This is from the oxygen used during your procedure.  There is no need for concern and it should clear up in a day or so.  SYMPTOMS TO REPORT IMMEDIATELY:   Following lower endoscopy (colonoscopy or flexible sigmoidoscopy):  Excessive amounts of blood in the stool  Significant tenderness or worsening of abdominal pains  Swelling of the abdomen that is new, acute  Fever of 100F or higher   For urgent or emergent issues, a gastroenterologist can be reached at any hour by calling [phone]. Do not use MyChart messaging for urgent concerns.    DIET:  We do recommend a small meal at first, but then you may proceed to your regular diet.  Drink plenty of fluids but you should avoid alcoholic beverages for 24 hours.  ACTIVITY:  You should plan to take  it easy for the rest of today and you should NOT DRIVE or use heavy machinery until tomorrow (because of the sedation medicines used during the test).    FOLLOW UP: Our staff will call the number listed on your records 48-72 hours following your procedure to check on you and address any questions or concerns that you may have regarding the information given to you following your procedure. If we do not reach you, we will leave a message.  We will attempt to reach you two times.  During this call, we will ask if you have developed any symptoms of COVID 19. If you develop any symptoms (ie: fever, flu-like symptoms, shortness of breath, cough etc.) before then, please call [phone].  If you test positive for Covid 19 in the 2 weeks post procedure, please call and report this information to us.    If any biopsies were taken you will be contacted by phone or by letter within the next 1-3 weeks.  Please call us at [phone] if you have not heard about the biopsies in 3 weeks.    SIGNATURES/CONFIDENTIALITY: You and/or your care partner have signed paperwork which will be entered into your electronic medical record.  These signatures attest to the fact that that the information above on your After Visit Summary has been reviewed and is understood.  Full responsibility of the confidentiality of this discharge information lies with you and/or your care-partner. 

## 2020-05-03 NOTE — Progress Notes (Signed)
Called to room to assist during endoscopic procedure.  Patient ID and intended procedure confirmed with present staff. Received instructions for my participation in the procedure from the performing physician.  

## 2020-05-03 NOTE — Op Note (Signed)
[facility] Patient Name: Aaron Dyer Procedure Date: 05/03/2020 4:12 PM MRN: [id number] Endoscopist: Jerene Bears , MD Age: 55 Referring MD:  Date of Birth: [birth year] Gender: Male Account #: [id number] Procedure:                Colonoscopy Indications:              High risk colon cancer surveillance: Personal                            history of non-advanced adenomas, Last colonoscopy:                            August 2016 Medicines:                Monitored Anesthesia Care Procedure:                Pre-Anesthesia Assessment:                           - Prior to the procedure, a History and Physical                            was performed, and patient medications and                            allergies were reviewed. The patient's tolerance of                            previous anesthesia was also reviewed. The risks                            and benefits of the procedure and the sedation                            options and risks were discussed with the patient.                            All questions were answered, and informed consent                            was obtained. Prior Anticoagulants: The patient has                            taken no previous anticoagulant or antiplatelet                            agents. ASA Grade Assessment: II - A patient with                            mild systemic disease. After reviewing the risks                            and benefits, the patient was deemed in  satisfactory condition to undergo the procedure.                           After obtaining informed consent, the colonoscope                            was passed under direct vision. Throughout the                            procedure, the patient's blood pressure, pulse, and                            oxygen saturations were monitored continuously. The                            Olympus CF-HQ190L ([id number]) Colonoscope was                             introduced through the anus and advanced to the                            cecum, identified by appendiceal orifice and                            ileocecal valve. The colonoscopy was performed                            without difficulty. The patient tolerated the                            procedure well. The quality of the bowel                            preparation was good. The ileocecal valve,                            appendiceal orifice, and rectum were photographed. Scope In: 4:18:25 PM Scope Out: 4:35:52 PM Scope Withdrawal Time: 0 hours 14 minutes 41 seconds  Total Procedure Duration: 0 hours 17 minutes 27 seconds  Findings:                 The digital rectal exam was normal.                           A 3 mm polyp was found in the transverse colon. The                            polyp was sessile. The polyp was removed with a                            cold snare. Resection and retrieval were complete.                           A 4 mm polyp was found in the descending colon.  The                            polyp was sessile. The polyp was removed with a                            cold snare. Resection and retrieval were complete.                           Multiple small and large-mouthed diverticula were                            found in the sigmoid colon and ascending colon.                           Evidence of surgical hemorrhoidectomy in the distal                            rectum. No additional abnormalities were found on                            retroflexion. Complications:            No immediate complications. Estimated Blood Loss:     Estimated blood loss: none. Impression:               - One 3 mm polyp in the transverse colon, removed                            with a cold snare. Resected and retrieved.                           - One 4 mm polyp in the descending colon, removed                            with a cold snare. Resected and  retrieved.                           - Diverticulosis in the sigmoid colon and in the                            ascending colon. Recommendation:           - Patient has a contact number available for                            emergencies. The signs and symptoms of potential                            delayed complications were discussed with the                            patient. Return to normal activities tomorrow.                            Written  discharge instructions were provided to the                            patient.                           - Resume previous diet.                           - Continue present medications.                           - Await pathology results.                           - Repeat colonoscopy is recommended for                            surveillance. The colonoscopy date will be                            determined after pathology results from today's                            exam become available for review. Jerene Bears, MD 05/03/2020 4:39:24 PM This report has been signed electronically.

## 2020-05-05 ENCOUNTER — Telehealth: Payer: Self-pay

## 2020-05-05 ENCOUNTER — Telehealth: Payer: Self-pay | Admitting: *Deleted

## 2020-05-05 NOTE — Telephone Encounter (Signed)
  Follow up Call-  Call back number 05/03/2020  Post procedure Call Back phone  # [phone]  Permission to leave phone message Yes  Some recent data might be hidden     Patient questions:  Do you have a fever, pain , or abdominal swelling? No. Pain Score  0 *  Have you tolerated food without any problems? Yes.    Have you been able to return to your normal activities? Yes.    Do you have any questions about your discharge instructions: Diet   No. Medications  No. Follow up visit  No.  Do you have questions or concerns about your Care? No.  Actions: * If pain score is 4 or above: No action needed, pain <4. 1. Have you developed a fever since your procedure? no  2.   Have you had an respiratory symptoms (SOB or cough) since your procedure? no  3.   Have you tested positive for COVID 19 since your procedure no  4.   Have you had any family members/close contacts diagnosed with the COVID 19 since your procedure?  no   If yes to any of these questions please route to Joylene John, RN and Joella Prince, RN

## 2020-05-05 NOTE — Telephone Encounter (Signed)
First follow up call attempt.  LVM. 

## 2020-05-11 ENCOUNTER — Encounter: Payer: Self-pay | Admitting: Internal Medicine

## 2020-05-11 NOTE — Telephone Encounter (Signed)
Do you have this?

## 2020-05-12 ENCOUNTER — Telehealth: Payer: Self-pay

## 2020-05-12 NOTE — Telephone Encounter (Signed)
Received a message from patient's spouse Sharyn Lull) [phone] who is inquiring about incomplete paperwork for [place].  She received a message from [place] on 2/17 and would like to speak to someone.  Thank you

## 2020-05-12 NOTE — Telephone Encounter (Signed)
Returned call to [place]. Missing information completed. Will follow up with patient as needed.  [employer] safety form faxed today

## 2020-05-16 ENCOUNTER — Telehealth: Payer: Self-pay

## 2020-05-16 NOTE — Telephone Encounter (Signed)
Called and spoke w/pt snf denied... instructed pt to apply for low income subsidy. Pt voiced understanding

## 2020-05-24 ENCOUNTER — Other Ambulatory Visit: Payer: Self-pay | Admitting: Family Medicine

## 2020-05-24 DIAGNOSIS — K649 Unspecified hemorrhoids: Secondary | ICD-10-CM

## 2020-05-24 DIAGNOSIS — E785 Hyperlipidemia, unspecified: Secondary | ICD-10-CM

## 2020-05-24 DIAGNOSIS — W57XXXA Bitten or stung by nonvenomous insect and other nonvenomous arthropods, initial encounter: Secondary | ICD-10-CM

## 2020-05-24 DIAGNOSIS — F32A Depression, unspecified: Secondary | ICD-10-CM

## 2020-05-24 DIAGNOSIS — S0101XA Laceration without foreign body of scalp, initial encounter: Secondary | ICD-10-CM

## 2020-05-24 DIAGNOSIS — D72829 Elevated white blood cell count, unspecified: Secondary | ICD-10-CM

## 2020-05-24 DIAGNOSIS — M545 Low back pain, unspecified: Secondary | ICD-10-CM

## 2020-05-24 DIAGNOSIS — F418 Other specified anxiety disorders: Secondary | ICD-10-CM

## 2020-06-05 ENCOUNTER — Encounter: Payer: Self-pay | Admitting: Family Medicine

## 2020-06-05 DIAGNOSIS — M545 Low back pain, unspecified: Secondary | ICD-10-CM

## 2020-06-05 DIAGNOSIS — D72829 Elevated white blood cell count, unspecified: Secondary | ICD-10-CM

## 2020-06-05 DIAGNOSIS — F32A Depression, unspecified: Secondary | ICD-10-CM

## 2020-06-05 DIAGNOSIS — S0101XA Laceration without foreign body of scalp, initial encounter: Secondary | ICD-10-CM

## 2020-06-05 DIAGNOSIS — W57XXXA Bitten or stung by nonvenomous insect and other nonvenomous arthropods, initial encounter: Secondary | ICD-10-CM

## 2020-06-05 DIAGNOSIS — F418 Other specified anxiety disorders: Secondary | ICD-10-CM

## 2020-06-05 DIAGNOSIS — E785 Hyperlipidemia, unspecified: Secondary | ICD-10-CM

## 2020-06-05 DIAGNOSIS — K649 Unspecified hemorrhoids: Secondary | ICD-10-CM

## 2020-06-05 MED ORDER — BUPROPION HCL ER (XL) 300 MG PO TB24: 300.0000 mg | ORAL_TABLET | Freq: Every day | ORAL | 0 refills | Status: DC

## 2020-06-05 MED ORDER — MELOXICAM 15 MG PO TABS: 15.0000 mg | ORAL_TABLET | Freq: Every day | ORAL | 0 refills | Status: DC

## 2020-06-05 MED ORDER — FLUOXETINE HCL 20 MG PO CAPS: 20.0000 mg | ORAL_CAPSULE | Freq: Every day | ORAL | 0 refills | Status: DC

## 2020-06-08 ENCOUNTER — Ambulatory Visit (HOSPITAL_BASED_OUTPATIENT_CLINIC_OR_DEPARTMENT_OTHER)
Admission: RE | Admit: 2020-06-08 | Discharge: 2020-06-08 | Disposition: A | Discharge status: Home / Self Care | Payer: Medicare Other | Source: Ambulatory Visit | Attending: Medical | Admitting: Medical

## 2020-06-08 ENCOUNTER — Other Ambulatory Visit: Payer: Self-pay

## 2020-06-08 ENCOUNTER — Ambulatory Visit (INDEPENDENT_AMBULATORY_CARE_PROVIDER_SITE_OTHER): Payer: Medicare Other | Admitting: Medical

## 2020-06-08 ENCOUNTER — Encounter: Payer: Self-pay | Admitting: Medical

## 2020-06-08 VITALS — BP 135/89 | HR 84 | Temp 97.7°F | Resp 18 | Ht 69.0 in | Wt 170.0 lb

## 2020-06-08 DIAGNOSIS — R21 Rash and other nonspecific skin eruption: Secondary | ICD-10-CM | POA: Diagnosis not present

## 2020-06-08 DIAGNOSIS — I10 Essential (primary) hypertension: Secondary | ICD-10-CM

## 2020-06-08 DIAGNOSIS — G8929 Other chronic pain: Secondary | ICD-10-CM

## 2020-06-08 DIAGNOSIS — M25511 Pain in right shoulder: Secondary | ICD-10-CM | POA: Diagnosis not present

## 2020-06-08 DIAGNOSIS — E785 Hyperlipidemia, unspecified: Secondary | ICD-10-CM | POA: Diagnosis not present

## 2020-06-08 DIAGNOSIS — R5383 Other fatigue: Secondary | ICD-10-CM | POA: Diagnosis not present

## 2020-06-08 DIAGNOSIS — M2550 Pain in unspecified joint: Secondary | ICD-10-CM

## 2020-06-08 LAB — COMPREHENSIVE METABOLIC PANEL
ALT: 13 U/L (ref 0–53)
AST: 14 U/L (ref 0–37)
Albumin: 4.6 g/dL (ref 3.5–5.2)
Alkaline Phosphatase: 65 U/L (ref 39–117)
BUN: 16 mg/dL (ref 6–23)
CO2: 28 mEq/L (ref 19–32)
Calcium: 9.6 mg/dL (ref 8.4–10.5)
Chloride: 101 mEq/L (ref 96–112)
Creatinine, Ser: 1.21 mg/dL (ref 0.40–1.50)
GFR: 67.47 mL/min (ref >60.00)
Glucose, Bld: 80 mg/dL (ref 70–99)
Potassium: 4.5 mEq/L (ref 3.5–5.1)
Sodium: 139 mEq/L (ref 135–145)
Total Bilirubin: 0.4 mg/dL (ref 0.2–1.2)
Total Protein: 6.8 g/dL (ref 6.0–8.3)

## 2020-06-08 LAB — C-REACTIVE PROTEIN: CRP: <1 mg/dL (ref 0.5–20.0)

## 2020-06-08 LAB — CBC WITH DIFFERENTIAL/PLATELET
Basophils Absolute: 0.1 10*3/uL (ref 0.0–0.1)
Basophils Relative: 0.6 % (ref 0.0–3.0)
Eosinophils Absolute: 0.1 10*3/uL (ref 0.0–0.7)
Eosinophils Relative: 1 % (ref 0.0–5.0)
HCT: 42.7 % (ref 39.0–52.0)
Hemoglobin: 14.6 g/dL (ref 13.0–17.0)
Lymphocytes Relative: 27.7 % (ref 12.0–46.0)
Lymphs Abs: 2.9 10*3/uL (ref 0.7–4.0)
MCHC: 34.1 g/dL (ref 30.0–36.0)
MCV: 93.2 fl (ref 78.0–100.0)
Monocytes Absolute: 0.5 10*3/uL (ref 0.1–1.0)
Monocytes Relative: 5.2 % (ref 3.0–12.0)
Neutro Abs: 6.8 10*3/uL (ref 1.4–7.7)
Neutrophils Relative %: 65.5 % (ref 43.0–77.0)
Platelets: 303 10*3/uL (ref 150.0–400.0)
RBC: 4.58 Mil/uL (ref 4.22–5.81)
RDW: 13.5 % (ref 11.5–15.5)
WBC: 10.5 10*3/uL (ref 4.0–10.5)

## 2020-06-08 LAB — SEDIMENTATION RATE: Sed Rate: 14 mm/hr (ref 0–20)

## 2020-06-08 LAB — TSH: TSH: 2.63 u[IU]/mL (ref 0.35–4.50)

## 2020-06-08 LAB — URIC ACID: Uric Acid, Serum: 5.8 mg/dL (ref 4.0–7.8)

## 2020-06-08 LAB — T4, FREE: Free T4: 0.85 ng/dL (ref 0.60–1.60)

## 2020-06-08 MED ORDER — MELOXICAM 15 MG PO TABS: 15.0000 mg | ORAL_TABLET | Freq: Every day | ORAL | 0 refills | Status: DC

## 2020-06-08 MED ORDER — FAMCICLOVIR 500 MG PO TABS: 500.0000 mg | ORAL_TABLET | Freq: Three times a day (TID) | ORAL | 0 refills | Status: DC

## 2020-06-08 MED ORDER — LOSARTAN POTASSIUM-HCTZ 100-12.5 MG PO TABS: 1.0000 | ORAL_TABLET | Freq: Every day | ORAL | 0 refills | Status: DC

## 2020-06-08 NOTE — Progress Notes (Signed)
Subjective:    Patient ID: Aaron Dyer, male    DOB: December 20, 1965, 55 y.o.   MRN: [id number]  HPI  Pt in for recent use of repatha. Pt had reactions to statins so he saw cardiologist and they prescribed repatha.  Pt last repatha injection was end of February.   Pt recently has some diffuse shoulder pain, elbow and wrist pains.   Pt ran out of meloxicam 2 weeks ago. Flare of joint pains occurred since ran out of meds.  Pt recently got a rash on rt thigh and rt upper chest. On chest first bumps came up about one week ago. Came up on thigh at same time.  Pt has hx of htn.   Pt also has some fatigue as well past 2 weeks.  Pt has history of skin cancer. He has dermatologist.  Recently checked and told no worrisome lesions.    Review of Systems  Constitutional: Positive for fatigue. Negative for chills and fever.  HENT: Negative for congestion, dental problem and ear pain.   Respiratory: Negative for cough, chest tightness, shortness of breath and wheezing.   Cardiovascular: Negative for chest pain and palpitations.  Gastrointestinal: Negative for anal bleeding, diarrhea and vomiting.  Musculoskeletal: Positive for arthralgias. Negative for myalgias.  Skin: Positive for rash.  Hematological: Negative for adenopathy. Does not bruise/bleed easily.  Psychiatric/Behavioral: Negative for behavioral problems, decreased concentration and suicidal ideas. The patient is not nervous/anxious and is not hyperactive.     Past Medical History:  Diagnosis Date  . Anxiety and depression   . Arthritis    neck  . Diverticulosis of colon without hemorrhage 12/18/2014  . ED (erectile dysfunction)   . GERD (gastroesophageal reflux disease)   . History of adenomatous polyp of colon   . History of adenomatous polyp of colon   . History of exercise stress test 06/09/2006   ECHO ETT-- NORMAL  . History of melanoma excision    nonmalignant and malignant   . History of nonmelanoma skin cancer  dermatologist-  dr williford   multiple areas on body,  SCC majority  . Hyperlipidemia   . Hypertension   . IBS (irritable bowel syndrome)   . Prolapsed internal hemorrhoids, grade 4      Social History   Socioeconomic History  . Marital status: Married    Spouse name: Sharyn Lull  . Number of children: 2  . Years of education: Not on file  . Highest education level: Not on file  Occupational History  . Occupation: Landscape  Tobacco Use  . Smoking status: Never Smoker  . Smokeless tobacco: Never Used  Vaping Use  . Vaping Use: Never used  Substance and Sexual Activity  . Alcohol use: Yes    Alcohol/week: 0.0 standard drinks    Comment: OCCASIONAL  . Drug use: No  . Sexual activity: Not on file  Other Topics Concern  . Not on file  Social History Narrative  . Not on file   Social Determinants of Health   Financial Resource Strain: Not on file  Food Insecurity: Not on file  Transportation Needs: Not on file  Physical Activity: Not on file  Stress: Not on file  Social Connections: Not on file  Intimate Partner Violence: Not on file    Past Surgical History:  Procedure Laterality Date  . COLONOSCOPY  last one 11-10-2014  dr pyrtle  . EVALUATION UNDER ANESTHESIA WITH HEMORRHOIDECTOMY N/A 10/17/2017   Procedure: EXAM UNDER ANESTHESIA WITH HEMORRHOIDECTOMY AND HEMORRHOIDAL LIGATION/PEXY  TIMES TWO;  Surgeon: Michael Boston, MD;  Location: [facility];  Service: General;  Laterality: N/A;  . EXCISION SEBACEOUS CYST  10-18-2013   @[facility]    ON BACK  . FLAP CLOSURE LEFT CHEEK POST WLE  09-18-2012    @[facility]   . MOHS SURGERY  X2 2010   right ear  . WIDE LOCAL EXCISION LEFT CHEEK AND LEFT CERVICAL SENITNAL LYMPH NODE BX  09-07-2012    @[facility]     Family History  Problem Relation Age of Onset  . COPD Mother   . Emphysema Mother   . Stroke Mother        X 2  . Heart disease Mother        CABG in her 43s  . Hyperlipidemia Mother   . Hypertension Mother   .  Diabetes Mother        type 2  . Cancer Father        melanoma  . Stroke Father        47  . Hypertension Father   . Alcohol abuse Father   . Heart disease Father   . Diabetes Maternal Grandmother        type 2  . Heart disease Maternal Grandmother        CHF  . Heart attack Maternal Grandfather   . Heart disease Paternal Grandmother   . Heart disease Paternal Grandfather   . Colon cancer Neg Hx   . Colon polyps Neg Hx   . Esophageal cancer Neg Hx   . Rectal cancer Neg Hx   . Stomach cancer Neg Hx     Allergies  Allergen Reactions  . Crestor [Rosuvastatin] Other (See Comments)    myalgias    Current Outpatient Medications on File Prior to Visit  Medication Sig Dispense Refill  . buPROPion (WELLBUTRIN XL) 300 MG 24 hr tablet Take 1 tablet (300 mg total) by mouth daily. 90 tablet 0  . cetirizine (EQ ALLERGY RELIEF, CETIRIZINE,) 10 MG tablet Take 1 tablet (10 mg total) by mouth daily as needed for allergies or rhinitis. 90 tablet 3  . cyclobenzaprine (FLEXERIL) 10 MG tablet Take 0.5-1 tablets (5-10 mg total) by mouth 3 (three) times daily as needed for muscle spasms. 21 tablet 0  . famotidine (PEPCID) 40 MG tablet Take 1 tablet (40 mg total) by mouth daily. 90 tablet 3  . fenofibrate 54 MG tablet TAKE 1 TABLET BY MOUTH ONCE DAILY WITH SUPPER 90 tablet 2  . FLUoxetine (PROZAC) 20 MG capsule Take 1 capsule (20 mg total) by mouth daily. 90 capsule 0  . fluticasone (FLONASE) 50 MCG/ACT nasal spray Place 2 sprays into both nostrils daily as needed. 16 g 3   No current facility-administered medications on file prior to visit.    BP 135/89   Pulse 84   Temp 97.7 F (36.5 C) (Oral)   Resp 18   Ht 5\' 9"  (1.753 m)   Wt 170 lb (77.1 kg)   SpO2 99%   BMI 25.10 kg/m       Objective:   Physical Exam  General- No acute distress. Pleasant patient. Neck- Full range of motion, no jvd Lungs- Clear, even and unlabored. Heart- regular rate and rhythm. Neurologic- CNII- XII  grossly intact.  Right shoulder-on range of motion reports pain.  Also range of motion looks a little bit restricted partially on abduction. Hands-thick MCP joints. Elbows, good range of motion with no inflammation or crepitus. Wrist/bilateral, no swelling or warmth.  Skin-he has just above the nipple 3-4 scabs present in linear pattern.  Diameter scabs are about 56mm.  Suspect possible vesicles prior to scab formation.  Also right thigh has 4-5 grouped small scabs present.      Assessment & Plan:  You do have history of arthralgias and recently worse over the past 2 weeks when you ran out of meloxicam.  Will get inflammatory lab studies today and get x-ray of right shoulder which is the worst/most painful joint.  I refilled your meloxicam today.  NSAIDs can increase blood pressure so if you do not have significant pain you could take just half tab dose rather than the full tab.  History of hyperlipidemia.  History of reactions to statins.  Now using Repatha.  You were 2 weeks past year date of injection.  I think your diffuse joint pains are likely associated with running out of meloxicam.  However I would asked that you notify your cardiologist regarding diffuse joint pains and let them make a decision whether or not you should receive further Repatha.  Recent fatigue over the past couple weeks.  Will get CBC, CMP, B1, TSH and T4.  Decided not to do B12 and vitamin D since your insurance might not cover.  However if fatigue persists and labs negative we could placed those orders.  History of hypertension.  Your blood pressure was better on second recheck.  Refilled your lisinopril and HCTZ.  Rash right side chest and right thigh.  The right side chest area looks like you had former small probable vesicles that ruptured and now some scab formation.  Concerned that you might have shingles.  Prescribe Famvir.  If your rash persists or worsens then would recommend that you follow-up with  dermatologist since you do report history of skin cancer.  Follow-up in 10 to 14 days or as needed.  Time spent with patient today was 40  minutes which consisted of chart review, discussing diagnosis, work up treatment and documentation.

## 2020-06-08 NOTE — Patient Instructions (Addendum)
You do have history of arthralgias and recently worse over the past 2 weeks when you ran out of meloxicam.  Will get inflammatory lab studies today and get x-ray of right shoulder which is the worst/most painful joint.  I refilled your meloxicam today.  NSAIDs can increase blood pressure so if you do not have significant pain you could take just half tab dose rather than the full tab.  History of hyperlipidemia.  History of reactions to statins.  Now using Repatha.  You were 2 weeks past year date of injection.  I think your diffuse joint pains are likely associated with running out of meloxicam.  However I would asked that you notify your cardiologist regarding diffuse joint pains and let them make a decision whether or not you should receive further Repatha.  Recent fatigue over the past couple weeks.  Will get CBC, CMP, B1, TSH and T4.  Decided not to do B12 and vitamin D since your insurance might not cover.  However if fatigue persists and labs negative we could placed those orders.  History of hypertension.  Your blood pressure was better on second recheck.  Refilled your lisinopril and HCTZ.  Rash right side chest and right thigh.  The right side chest area looks like you had former small probable vesicles that ruptured and now some scab formation.  Concerned that you might have shingles.  Prescribe Famvir.  If your rash persists or worsens then would recommend that you follow-up with dermatologist since you do report history of skin cancer.  Follow-up in 10 to 14 days or as needed.

## 2020-06-11 ENCOUNTER — Telehealth: Payer: Self-pay | Admitting: Medical

## 2020-06-11 DIAGNOSIS — R768 Other specified abnormal immunological findings in serum: Secondary | ICD-10-CM

## 2020-06-11 DIAGNOSIS — M2550 Pain in unspecified joint: Secondary | ICD-10-CM

## 2020-06-11 DIAGNOSIS — R7689 Other specified abnormal immunological findings in serum: Secondary | ICD-10-CM

## 2020-06-11 NOTE — Telephone Encounter (Signed)
  Referral to rheumatologist placed.

## 2020-06-12 ENCOUNTER — Encounter: Payer: Self-pay | Admitting: Medical

## 2020-06-13 LAB — VITAMIN B1: Vitamin B1 (Thiamine): 14 nmol/L (ref 8–30)

## 2020-06-13 LAB — ANTI-NUCLEAR AB-TITER (ANA TITER): ANA Titer 1: 1:80 {titer} — ABNORMAL HIGH

## 2020-06-13 LAB — ANA: Anti Nuclear Antibody (ANA): POSITIVE — AB

## 2020-06-13 LAB — RHEUMATOID FACTOR: Rheumatoid fact SerPl-aCnc: <14 IU/mL (ref <14)

## 2020-06-14 ENCOUNTER — Ambulatory Visit: Payer: Medicare Other | Admitting: Family Medicine

## 2020-06-15 DIAGNOSIS — L570 Actinic keratosis: Secondary | ICD-10-CM | POA: Diagnosis not present

## 2020-06-15 DIAGNOSIS — L739 Follicular disorder, unspecified: Secondary | ICD-10-CM | POA: Diagnosis not present

## 2020-06-22 DIAGNOSIS — R768 Other specified abnormal immunological findings in serum: Secondary | ICD-10-CM | POA: Diagnosis not present

## 2020-06-22 DIAGNOSIS — M2550 Pain in unspecified joint: Secondary | ICD-10-CM | POA: Diagnosis not present

## 2020-06-22 DIAGNOSIS — R5383 Other fatigue: Secondary | ICD-10-CM | POA: Diagnosis not present

## 2020-06-22 DIAGNOSIS — Z6824 Body mass index (BMI) 24.0-24.9, adult: Secondary | ICD-10-CM | POA: Diagnosis not present

## 2020-06-22 DIAGNOSIS — M159 Polyosteoarthritis, unspecified: Secondary | ICD-10-CM | POA: Diagnosis not present

## 2020-07-24 NOTE — Progress Notes (Deleted)
Office Visit Note  Patient: Aaron Dyer             Date of Birth: 11/23/1965           MRN: [id number]             PCP: Mosie Lukes, MD Referring: Elise Benne Visit Date: 08/07/2020 Occupation: _0 @  Subjective:  No chief complaint on file.   History of Present Illness: Aaron Dyer is a 55 y.o. male ***   Activities of Daily Living:  Patient reports morning stiffness for *** {minute/hour:19697}.   Patient {ACTIONS;DENIES/REPORTS:21021675::"Denies"} nocturnal pain.  Difficulty dressing/grooming: {ACTIONS;DENIES/REPORTS:21021675::"Denies"} Difficulty climbing stairs: {ACTIONS;DENIES/REPORTS:21021675::"Denies"} Difficulty getting out of chair: {ACTIONS;DENIES/REPORTS:21021675::"Denies"} Difficulty using hands for taps, buttons, cutlery, and/or writing: {ACTIONS;DENIES/REPORTS:21021675::"Denies"}  No Rheumatology ROS completed.   PMFS History:  Patient Active Problem List   Diagnosis Date Noted  . Essential hypertension 09/21/2019  . Nonocclusive coronary atherosclerosis of native coronary artery 09/21/2019  . Chronic left shoulder pain 04/13/2018  . DOE (dyspnea on exertion) 04/13/2018  . Abdominal pain 04/13/2018  . Acute bronchitis 04/13/2018  . Blood in stool 06/04/2017  . Night sweats 06/04/2017  . Change in bowel habits 06/04/2017  . Anemia 06/04/2017  . Preventative health care 06/04/2017  . Myalgia 06/03/2017  . 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 Squamous cell carcinoma of thigh, right 05/14/2017  . Constipation 09/08/2015  . Cold intolerance 06/04/2015  . Squamous cell carcinoma of left hand 05/11/2015  . Diverticulosis of colon without hemorrhage 12/18/2014  . Neck pain, bilateral 11/29/2013  . ED (erectile dysfunction) 08/22/2013  . Back pain 03/15/2013  . GERD (gastroesophageal reflux disease) 08/26/2012  . Melanoma of face ([place]) 08/04/2012  . Lipoma 04/21/2012  . Sun-damaged skin  04/21/2012  . Chest pain 11/19/2011  . Colon polyps 05/28/2011  . Insomnia 05/28/2011  . Urinary frequency 04/30/2011  . Hematuria 04/30/2011  . Reflux 04/29/2011  . Prolapsed internal hemorrhoids, grade 3 04/29/2011  . Anxiety and depression   . Skin cancer   . Mixed hyperlipidemia     Past Medical History:  Diagnosis Date  . Anxiety and depression   . Arthritis    neck  . Diverticulosis of colon without hemorrhage 12/18/2014  . ED (erectile dysfunction)   . GERD (gastroesophageal reflux disease)   . History of adenomatous polyp of colon   . History of adenomatous polyp of colon   . History of exercise stress test 06/09/2006   ECHO ETT-- NORMAL  . History of melanoma excision    nonmalignant and malignant   . History of nonmelanoma skin cancer dermatologist-  dr williford   multiple areas on body,  SCC majority  . Hyperlipidemia   . Hypertension   . IBS (irritable bowel syndrome)   . Prolapsed internal hemorrhoids, grade 4     Family History  Problem Relation Age of Onset  . COPD Mother   . Emphysema Mother   . Stroke Mother        X 2  . Heart disease Mother        CABG in her 74s  . Hyperlipidemia Mother   . Hypertension Mother   . Diabetes Mother        type 2  . Cancer Father        melanoma  . Stroke Father        63  . Hypertension Father   . Alcohol abuse Father   .  Heart disease Father   . Diabetes Maternal Grandmother        type 2  . Heart disease Maternal Grandmother        CHF  . Heart attack Maternal Grandfather   . Heart disease Paternal Grandmother   . Heart disease Paternal Grandfather   . Colon cancer Neg Hx   . Colon polyps Neg Hx   . Esophageal cancer Neg Hx   . Rectal cancer Neg Hx   . Stomach cancer Neg Hx    Past Surgical History:  Procedure Laterality Date  . COLONOSCOPY  last one 11-10-2014  dr pyrtle  . EVALUATION UNDER ANESTHESIA WITH HEMORRHOIDECTOMY N/A 10/17/2017   Procedure: EXAM UNDER ANESTHESIA WITH HEMORRHOIDECTOMY  AND HEMORRHOIDAL LIGATION/PEXY TIMES TWO;  Surgeon: Michael Boston, MD;  Location: [facility];  Service: General;  Laterality: N/A;  . EXCISION SEBACEOUS CYST  10-18-2013   _0    ON BACK  . FLAP CLOSURE LEFT CHEEK POST WLE  09-18-2012    _1   . MOHS SURGERY  X2 2010   right ear  . WIDE LOCAL EXCISION LEFT CHEEK AND LEFT CERVICAL SENITNAL LYMPH NODE BX  09-07-2012    _2    Social History   Social History Narrative  . Not on file   Immunization History  Administered Date(s) Administered  . Td 03/25/2005  . Tdap 05/29/2015     Objective: Vital Signs: There were no vitals taken for this visit.   Physical Exam   Musculoskeletal Exam: ***  CDAI Exam: CDAI Score: -- Patient Global: --; Provider Global: -- Swollen: --; Tender: -- Joint Exam 08/07/2020   No joint exam has been documented for this visit   There is currently no information documented on the homunculus. Go to the Rheumatology activity and complete the homunculus joint exam.  Investigation: No additional findings.  Imaging: No results found.  Recent Labs: Lab Results  Component Value Date   WBC 10.5 06/08/2020   HGB 14.6 06/08/2020   PLT 303.0 06/08/2020   NA 139 06/08/2020   K 4.5 06/08/2020   CL 101 06/08/2020   CO2 28 06/08/2020   GLUCOSE 80 06/08/2020   BUN 16 06/08/2020   CREATININE 1.21 06/08/2020   BILITOT 0.4 06/08/2020   ALKPHOS 65 06/08/2020   AST 14 06/08/2020   ALT 13 06/08/2020   PROT 6.8 06/08/2020   ALBUMIN 4.6 06/08/2020   CALCIUM 9.6 06/08/2020    Speciality Comments: No specialty comments available.  Procedures:  No procedures performed Allergies: Crestor [rosuvastatin]   Assessment / Plan:     Visit Diagnoses: Polyarthralgia  Positive ANA (antinuclear antibody) - 06/08/20: ANA 1:80 NH, TSH 2.63, T4 0.85, Vitamin B1 14, Uric acid 5.8, ESR 14, CRP<1, RF<14  Myalgia  Melanoma of face (HCC)  History of squamous cell carcinoma  Mixed  hyperlipidemia  Nonocclusive coronary atherosclerosis of native coronary artery  Essential hypertension  History of gastroesophageal reflux (GERD)  History of colonic polyps  Diverticulosis of colon without hemorrhage  Anxiety and depression  Orders: No orders of the defined types were placed in this encounter.  No orders of the defined types were placed in this encounter.   Face-to-face time spent with patient was *** minutes. Greater than 50% of time was spent in counseling and coordination of care.  Follow-Up Instructions: No follow-ups on file.   Ofilia Neas, PA-C  Note - This record has been created using Dragon software.  Chart creation errors have been sought, but may  not always  have been located. Such creation errors do not reflect on  the standard of medical care.

## 2020-08-07 ENCOUNTER — Ambulatory Visit: Payer: Medicare Other | Admitting: Rheumatology

## 2020-08-07 DIAGNOSIS — E782 Mixed hyperlipidemia: Secondary | ICD-10-CM

## 2020-08-07 DIAGNOSIS — Z8719 Personal history of other diseases of the digestive system: Secondary | ICD-10-CM

## 2020-08-07 DIAGNOSIS — Z8589 Personal history of malignant neoplasm of other organs and systems: Secondary | ICD-10-CM

## 2020-08-07 DIAGNOSIS — C4330 Malignant melanoma of unspecified part of face: Secondary | ICD-10-CM

## 2020-08-07 DIAGNOSIS — F32A Depression, unspecified: Secondary | ICD-10-CM

## 2020-08-07 DIAGNOSIS — I10 Essential (primary) hypertension: Secondary | ICD-10-CM

## 2020-08-07 DIAGNOSIS — Z86010 Personal history of colonic polyps: Secondary | ICD-10-CM

## 2020-08-07 DIAGNOSIS — I2510 Atherosclerotic heart disease of native coronary artery without angina pectoris: Secondary | ICD-10-CM

## 2020-08-07 DIAGNOSIS — M2550 Pain in unspecified joint: Secondary | ICD-10-CM

## 2020-08-07 DIAGNOSIS — R768 Other specified abnormal immunological findings in serum: Secondary | ICD-10-CM

## 2020-08-07 DIAGNOSIS — K5730 Diverticulosis of large intestine without perforation or abscess without bleeding: Secondary | ICD-10-CM

## 2020-08-07 DIAGNOSIS — M791 Myalgia, unspecified site: Secondary | ICD-10-CM

## 2020-08-18 ENCOUNTER — Other Ambulatory Visit: Payer: Self-pay | Admitting: Family Medicine

## 2020-08-18 ENCOUNTER — Other Ambulatory Visit: Payer: Self-pay | Admitting: Medical

## 2020-08-18 DIAGNOSIS — K649 Unspecified hemorrhoids: Secondary | ICD-10-CM

## 2020-08-18 DIAGNOSIS — F32A Depression, unspecified: Secondary | ICD-10-CM

## 2020-08-18 DIAGNOSIS — E785 Hyperlipidemia, unspecified: Secondary | ICD-10-CM

## 2020-08-18 DIAGNOSIS — D72829 Elevated white blood cell count, unspecified: Secondary | ICD-10-CM

## 2020-08-18 DIAGNOSIS — F418 Other specified anxiety disorders: Secondary | ICD-10-CM

## 2020-08-18 DIAGNOSIS — M545 Low back pain, unspecified: Secondary | ICD-10-CM

## 2020-08-18 DIAGNOSIS — S0101XA Laceration without foreign body of scalp, initial encounter: Secondary | ICD-10-CM

## 2020-08-18 DIAGNOSIS — W57XXXA Bitten or stung by nonvenomous insect and other nonvenomous arthropods, initial encounter: Secondary | ICD-10-CM

## 2020-08-25 ENCOUNTER — Other Ambulatory Visit: Payer: Self-pay | Admitting: Family Medicine

## 2020-09-07 ENCOUNTER — Ambulatory Visit: Payer: Medicare Other | Admitting: Rheumatology

## 2020-09-29 DIAGNOSIS — Z20822 Contact with and (suspected) exposure to covid-19: Secondary | ICD-10-CM | POA: Diagnosis not present

## 2020-10-02 ENCOUNTER — Other Ambulatory Visit: Payer: Self-pay | Admitting: Family Medicine

## 2020-10-02 DIAGNOSIS — F32A Depression, unspecified: Secondary | ICD-10-CM

## 2020-10-02 DIAGNOSIS — D72829 Elevated white blood cell count, unspecified: Secondary | ICD-10-CM

## 2020-10-02 DIAGNOSIS — S0101XA Laceration without foreign body of scalp, initial encounter: Secondary | ICD-10-CM

## 2020-10-02 DIAGNOSIS — W57XXXA Bitten or stung by nonvenomous insect and other nonvenomous arthropods, initial encounter: Secondary | ICD-10-CM

## 2020-10-02 DIAGNOSIS — F418 Other specified anxiety disorders: Secondary | ICD-10-CM

## 2020-10-02 DIAGNOSIS — K649 Unspecified hemorrhoids: Secondary | ICD-10-CM

## 2020-10-02 DIAGNOSIS — M545 Low back pain, unspecified: Secondary | ICD-10-CM

## 2020-10-02 DIAGNOSIS — E785 Hyperlipidemia, unspecified: Secondary | ICD-10-CM

## 2020-10-18 DIAGNOSIS — Z85828 Personal history of other malignant neoplasm of skin: Secondary | ICD-10-CM | POA: Diagnosis not present

## 2020-10-18 DIAGNOSIS — L739 Follicular disorder, unspecified: Secondary | ICD-10-CM | POA: Diagnosis not present

## 2020-10-18 DIAGNOSIS — D229 Melanocytic nevi, unspecified: Secondary | ICD-10-CM | POA: Diagnosis not present

## 2020-10-18 DIAGNOSIS — L209 Atopic dermatitis, unspecified: Secondary | ICD-10-CM | POA: Diagnosis not present

## 2020-10-18 DIAGNOSIS — L814 Other melanin hyperpigmentation: Secondary | ICD-10-CM | POA: Diagnosis not present

## 2020-10-18 DIAGNOSIS — Z85820 Personal history of malignant melanoma of skin: Secondary | ICD-10-CM | POA: Diagnosis not present

## 2020-10-18 DIAGNOSIS — Z5181 Encounter for therapeutic drug level monitoring: Secondary | ICD-10-CM | POA: Diagnosis not present

## 2020-10-18 DIAGNOSIS — L821 Other seborrheic keratosis: Secondary | ICD-10-CM | POA: Diagnosis not present

## 2020-10-24 ENCOUNTER — Ambulatory Visit: Payer: Medicare Other | Admitting: Family Medicine

## 2020-11-06 ENCOUNTER — Other Ambulatory Visit: Payer: Self-pay | Admitting: Family Medicine

## 2020-12-09 ENCOUNTER — Other Ambulatory Visit: Payer: Self-pay | Admitting: Family Medicine

## 2021-01-03 DIAGNOSIS — L209 Atopic dermatitis, unspecified: Secondary | ICD-10-CM | POA: Diagnosis not present

## 2021-01-15 ENCOUNTER — Other Ambulatory Visit: Payer: Self-pay | Admitting: Family Medicine

## 2021-01-30 DIAGNOSIS — Z20828 Contact with and (suspected) exposure to other viral communicable diseases: Secondary | ICD-10-CM | POA: Diagnosis not present

## 2021-02-11 ENCOUNTER — Other Ambulatory Visit: Payer: Self-pay | Admitting: Family Medicine

## 2021-02-15 ENCOUNTER — Other Ambulatory Visit: Payer: Self-pay | Admitting: Family Medicine

## 2021-03-19 ENCOUNTER — Other Ambulatory Visit: Payer: Self-pay | Admitting: Family Medicine

## 2021-03-27 ENCOUNTER — Other Ambulatory Visit: Payer: Self-pay | Admitting: Family Medicine

## 2021-03-27 ENCOUNTER — Encounter: Payer: Self-pay | Admitting: Family Medicine

## 2021-03-29 ENCOUNTER — Telehealth (INDEPENDENT_AMBULATORY_CARE_PROVIDER_SITE_OTHER): Payer: Medicare Other | Admitting: Medical

## 2021-03-29 VITALS — BP 141/84 | HR 77

## 2021-03-29 DIAGNOSIS — I10 Essential (primary) hypertension: Secondary | ICD-10-CM | POA: Diagnosis not present

## 2021-03-29 DIAGNOSIS — E875 Hyperkalemia: Secondary | ICD-10-CM

## 2021-03-29 DIAGNOSIS — M2550 Pain in unspecified joint: Secondary | ICD-10-CM | POA: Diagnosis not present

## 2021-03-29 DIAGNOSIS — E785 Hyperlipidemia, unspecified: Secondary | ICD-10-CM

## 2021-03-29 DIAGNOSIS — F418 Other specified anxiety disorders: Secondary | ICD-10-CM | POA: Diagnosis not present

## 2021-03-29 MED ORDER — AMLODIPINE BESYLATE 5 MG PO TABS: 5.0000 mg | ORAL_TABLET | Freq: Every day | ORAL | 11 refills | Status: DC

## 2021-03-29 MED ORDER — CETIRIZINE HCL 10 MG PO TABS: 10.0000 mg | ORAL_TABLET | Freq: Every day | ORAL | 3 refills | Status: DC

## 2021-03-29 MED ORDER — BUPROPION HCL ER (XL) 300 MG PO TB24: 300.0000 mg | ORAL_TABLET | Freq: Every day | ORAL | 5 refills | Status: DC

## 2021-03-29 MED ORDER — FLUOXETINE HCL 20 MG PO CAPS: 20.0000 mg | ORAL_CAPSULE | Freq: Every day | ORAL | 5 refills | Status: DC

## 2021-03-29 MED ORDER — FAMOTIDINE 40 MG PO TABS: 40.0000 mg | ORAL_TABLET | Freq: Every day | ORAL | 3 refills | Status: DC

## 2021-03-29 MED ORDER — MELOXICAM 15 MG PO TABS: 15.0000 mg | ORAL_TABLET | Freq: Every day | ORAL | 3 refills | Status: DC

## 2021-03-29 NOTE — Progress Notes (Signed)
Subjective:    Patient ID: Aaron Dyer, male    DOB: [birth year], 56 y.o.   MRN: [id number]  HPI Virtual Visit via Video Note  I connected with Rosebud Poles on 03/29/21 at 11:00 AM EST by a video enabled telemedicine application and verified that I am speaking with the correct person using two identifiers.  Location: Patient: home Provider: office.   I discussed the limitations of evaluation and management by telemedicine and the availability of in person appointments. The patient expressed understanding and agreed to proceed.  History of Present Illness: Pt has depression. On both wellbutrin and prozac. Sight dip in mood during christmas.   Htn- Decent bp today on losartan hctz. Pt clarifies that he has not been taking. Pt tells me when was on med had ED.  Hand pain/arthralgias. Osteoarthritis on xray. On meloxicam.  Gerd- pt needs refill of famotadine. He states does work/controls symptoms.   Observations/Objective:  General-no acute distress, pleasant, oriented. Lungs- on inspection lungs appear unlabored. Neck- no tracheal deviation or jvd on inspection. Neuro- gross motor function appears intact.  Assessment and Plan:  Patient Instructions  Your bp level is borderline high and you have not been on bp medication. Recommend checking bp daily to see if bp lower than 140/90. Would like bp to be ideally 130/80 or lower. If bp in higher end then start amlodipine. Won't rx losartan hctz due to prior side effect.  High cholesterol. Placing future lipid panel and cmp. Recommend possible med after review.  Depression and anxiety stable. Continue wellbutrin and prozac.   For arthralgia filled meloxicam.  Follow up date to be determined after lab review.       Mackie Pai, PA-C    Follow Up Instructions:    I discussed the assessment and treatment plan with the patient. The patient was provided an opportunity to ask questions and all were answered. The patient  agreed with the plan and demonstrated an understanding of the instructions.   The patient was advised to call back or seek an in-person evaluation if the symptoms worsen or if the condition fails to improve as anticipated.  Time spent with patient today was 33 minutes which consisted of chart revdiew, discussing diagnosis, work up treatment and documentation.    Mackie Pai, PA-C    Review of Systems  Constitutional:  Negative for fatigue and fever.  HENT:  Negative for congestion.   Respiratory:  Negative for cough, chest tightness, shortness of breath and wheezing.   Cardiovascular:  Negative for chest pain and palpitations.  Gastrointestinal:  Negative for abdominal pain, diarrhea and vomiting.       Gerd controlled.  Genitourinary:  Negative for dysuria and flank pain.  Musculoskeletal:  Negative for back pain.  Skin:  Negative for rash.  Neurological:  Negative for dizziness, numbness and headaches.  Psychiatric/Behavioral:  Positive for dysphoric mood. Negative for behavioral problems. The patient is nervous/anxious.        Stable with meds.    Past Medical History:  Diagnosis Date   Anxiety and depression    Arthritis    neck   Diverticulosis of colon without hemorrhage 12/18/2014   ED (erectile dysfunction)    GERD (gastroesophageal reflux disease)    History of adenomatous polyp of colon    History of adenomatous polyp of colon    History of exercise stress test 06/09/2006   ECHO ETT-- NORMAL   History of melanoma excision    nonmalignant and malignant  History of nonmelanoma skin cancer dermatologist-  dr williford   multiple areas on body,  SCC majority   Hyperlipidemia    Hypertension    IBS (irritable bowel syndrome)    Prolapsed internal hemorrhoids, grade 4      Social History   Socioeconomic History   Marital status: Married    Spouse name: Sharyn Lull   Number of children: 2   Years of education: Not on file   Highest education level: Not on  file  Occupational History   Occupation: Landscape  Tobacco Use   Smoking status: Never   Smokeless tobacco: Never  Vaping Use   Vaping Use: Never used  Substance and Sexual Activity   Alcohol use: Yes    Alcohol/week: 0.0 standard drinks    Comment: OCCASIONAL   Drug use: No   Sexual activity: Not on file  Other Topics Concern   Not on file  Social History Narrative   Not on file   Social Determinants of Health   Financial Resource Strain: Not on file  Food Insecurity: Not on file  Transportation Needs: Not on file  Physical Activity: Not on file  Stress: Not on file  Social Connections: Not on file  Intimate Partner Violence: Not on file    Past Surgical History:  Procedure Laterality Date   COLONOSCOPY  last one 11-10-2014  dr pyrtle   EVALUATION UNDER ANESTHESIA WITH HEMORRHOIDECTOMY N/A 10/17/2017   Procedure: EXAM UNDER ANESTHESIA WITH HEMORRHOIDECTOMY AND HEMORRHOIDAL LIGATION/PEXY TIMES TWO;  Surgeon: Michael Boston, MD;  Location: [facility];  Service: General;  Laterality: N/A;   EXCISION SEBACEOUS CYST  10-18-2013   @[facility]    ON BACK   FLAP CLOSURE LEFT CHEEK POST WLE  09-18-2012    @[facility]    MOHS SURGERY  X2 2010   right ear   WIDE LOCAL EXCISION LEFT CHEEK AND LEFT CERVICAL SENITNAL LYMPH NODE BX  09-07-2012    @[facility]     Family History  Problem Relation Age of Onset   COPD Mother    Emphysema Mother    Stroke Mother        X 2   Heart disease Mother        CABG in her 28s   Hyperlipidemia Mother    Hypertension Mother    Diabetes Mother        type 2   Cancer Father        melanoma   Stroke Father        1992   Hypertension Father    Alcohol abuse Father    Heart disease Father    Diabetes Maternal Grandmother        type 2   Heart disease Maternal Grandmother        CHF   Heart attack Maternal Grandfather    Heart disease Paternal Grandmother    Heart disease Paternal Grandfather    Colon cancer Neg Hx    Colon polyps Neg  Hx    Esophageal cancer Neg Hx    Rectal cancer Neg Hx    Stomach cancer Neg Hx     Allergies  Allergen Reactions   Crestor [Rosuvastatin] Other (See Comments)    myalgias    Current Outpatient Medications on File Prior to Visit  Medication Sig Dispense Refill   cetirizine (EQ ALLERGY RELIEF, CETIRIZINE,) 10 MG tablet TAKE 1 TABLET BY MOUTH ONCE DAILY AS NEEDED FOR ALLERGIES OR RHINITIS 30 tablet 0   cyclobenzaprine (FLEXERIL) 10 MG tablet  Take 0.5-1 tablets (5-10 mg total) by mouth 3 (three) times daily as needed for muscle spasms. 21 tablet 0   famciclovir (FAMVIR) 500 MG tablet Take 1 tablet (500 mg total) by mouth 3 (three) times daily. 21 tablet 0   fenofibrate 54 MG tablet TAKE 1 TABLET BY MOUTH ONCE DAILY WITH SUPPER 90 tablet 2   fluticasone (FLONASE) 50 MCG/ACT nasal spray Place 2 sprays into both nostrils daily as needed. 16 g 3   No current facility-administered medications on file prior to visit.    BP (!) 141/84    Pulse 77       Objective:   Physical Exam        Assessment & Plan:

## 2021-03-29 NOTE — Patient Instructions (Addendum)
Your bp level is borderline high and you have not been on bp medication. Recommend checking bp daily to see if bp lower than 140/90. Would like bp to be ideally 130/80 or lower. If bp in higher end then start amlodipine. Won't rx losartan hctz due to prior side effect.  High cholesterol. Placing future lipid panel and cmp. Recommend possible med after review.  Depression and anxiety stable. Continue wellbutrin and prozac.   For arthralgia filled meloxicam.  Follow up date to be determined after lab review.

## 2021-04-25 ENCOUNTER — Other Ambulatory Visit (INDEPENDENT_AMBULATORY_CARE_PROVIDER_SITE_OTHER): Payer: Medicare Other

## 2021-04-25 DIAGNOSIS — E785 Hyperlipidemia, unspecified: Secondary | ICD-10-CM | POA: Diagnosis not present

## 2021-04-25 DIAGNOSIS — L578 Other skin changes due to chronic exposure to nonionizing radiation: Secondary | ICD-10-CM | POA: Diagnosis not present

## 2021-04-25 DIAGNOSIS — I10 Essential (primary) hypertension: Secondary | ICD-10-CM

## 2021-04-25 DIAGNOSIS — C44629 Squamous cell carcinoma of skin of left upper limb, including shoulder: Secondary | ICD-10-CM | POA: Diagnosis not present

## 2021-04-25 DIAGNOSIS — D1801 Hemangioma of skin and subcutaneous tissue: Secondary | ICD-10-CM | POA: Diagnosis not present

## 2021-04-25 DIAGNOSIS — C44729 Squamous cell carcinoma of skin of left lower limb, including hip: Secondary | ICD-10-CM | POA: Diagnosis not present

## 2021-04-25 DIAGNOSIS — L821 Other seborrheic keratosis: Secondary | ICD-10-CM | POA: Diagnosis not present

## 2021-04-25 DIAGNOSIS — Z85820 Personal history of malignant melanoma of skin: Secondary | ICD-10-CM | POA: Diagnosis not present

## 2021-04-25 DIAGNOSIS — Z85828 Personal history of other malignant neoplasm of skin: Secondary | ICD-10-CM | POA: Diagnosis not present

## 2021-04-25 DIAGNOSIS — L570 Actinic keratosis: Secondary | ICD-10-CM | POA: Diagnosis not present

## 2021-04-26 LAB — LIPID PANEL
Cholesterol: 279 mg/dL — ABNORMAL HIGH (ref 0–200)
HDL: 41.1 mg/dL (ref >39.00)
NonHDL: 238.23
Total CHOL/HDL Ratio: 7
Triglycerides: 274 mg/dL — ABNORMAL HIGH (ref 0.0–149.0)
VLDL: 54.8 mg/dL — ABNORMAL HIGH (ref 0.0–40.0)

## 2021-04-26 LAB — LDL CHOLESTEROL, DIRECT: Direct LDL: 173 mg/dL

## 2021-04-26 LAB — COMPREHENSIVE METABOLIC PANEL
ALT: 18 U/L (ref 0–53)
AST: 16 U/L (ref 0–37)
Albumin: 5 g/dL (ref 3.5–5.2)
Alkaline Phosphatase: 87 U/L (ref 39–117)
BUN: 21 mg/dL (ref 6–23)
CO2: 32 mEq/L (ref 19–32)
Calcium: 10.7 mg/dL — ABNORMAL HIGH (ref 8.4–10.5)
Chloride: 100 mEq/L (ref 96–112)
Creatinine, Ser: 1.19 mg/dL (ref 0.40–1.50)
GFR: 68.4 mL/min (ref >60.00)
Glucose, Bld: 79 mg/dL (ref 70–99)
Potassium: 5.6 mEq/L — ABNORMAL HIGH (ref 3.5–5.1)
Sodium: 137 mEq/L (ref 135–145)
Total Bilirubin: 0.5 mg/dL (ref 0.2–1.2)
Total Protein: 7.5 g/dL (ref 6.0–8.3)

## 2021-04-26 MED ORDER — SODIUM POLYSTYRENE SULFONATE 15 GM/60ML PO SUSP: ORAL | 0 refills | Status: DC

## 2021-04-26 MED ORDER — EZETIMIBE 10 MG PO TABS: 10.0000 mg | ORAL_TABLET | Freq: Every day | ORAL | 3 refills | Status: DC

## 2021-04-26 NOTE — Addendum Note (Signed)
Addended by: Anabel Halon on: 04/26/2021 05:44 PM   Modules accepted: Orders

## 2021-04-27 ENCOUNTER — Telehealth: Payer: Self-pay | Admitting: Medical

## 2021-04-27 ENCOUNTER — Encounter: Payer: Self-pay | Admitting: Medical

## 2021-04-27 ENCOUNTER — Other Ambulatory Visit (HOSPITAL_BASED_OUTPATIENT_CLINIC_OR_DEPARTMENT_OTHER): Payer: Self-pay

## 2021-04-27 MED ORDER — SODIUM POLYSTYRENE SULFONATE 15 GM/60ML PO SUSP
ORAL | 0 refills | Status: DC
  Filled 2021-04-27: qty 180, 3d supply, fill #0

## 2021-04-27 NOTE — Telephone Encounter (Signed)
op

## 2021-04-30 ENCOUNTER — Other Ambulatory Visit (HOSPITAL_BASED_OUTPATIENT_CLINIC_OR_DEPARTMENT_OTHER): Payer: Self-pay

## 2021-05-02 ENCOUNTER — Other Ambulatory Visit: Payer: Medicare Other

## 2021-05-04 ENCOUNTER — Other Ambulatory Visit (INDEPENDENT_AMBULATORY_CARE_PROVIDER_SITE_OTHER): Payer: Medicare Other

## 2021-05-04 DIAGNOSIS — E875 Hyperkalemia: Secondary | ICD-10-CM

## 2021-05-04 LAB — COMPREHENSIVE METABOLIC PANEL
ALT: 17 U/L (ref 0–53)
AST: 15 U/L (ref 0–37)
Albumin: 4.4 g/dL (ref 3.5–5.2)
Alkaline Phosphatase: 78 U/L (ref 39–117)
BUN: 19 mg/dL (ref 6–23)
CO2: 33 mEq/L — ABNORMAL HIGH (ref 19–32)
Calcium: 9.4 mg/dL (ref 8.4–10.5)
Chloride: 103 mEq/L (ref 96–112)
Creatinine, Ser: 1.01 mg/dL (ref 0.40–1.50)
GFR: 83.27 mL/min (ref >60.00)
Glucose, Bld: 94 mg/dL (ref 70–99)
Potassium: 4.6 mEq/L (ref 3.5–5.1)
Sodium: 139 mEq/L (ref 135–145)
Total Bilirubin: 0.6 mg/dL (ref 0.2–1.2)
Total Protein: 6.7 g/dL (ref 6.0–8.3)

## 2021-05-07 ENCOUNTER — Other Ambulatory Visit (HOSPITAL_BASED_OUTPATIENT_CLINIC_OR_DEPARTMENT_OTHER): Payer: Self-pay

## 2021-05-23 DIAGNOSIS — Z20822 Contact with and (suspected) exposure to covid-19: Secondary | ICD-10-CM | POA: Diagnosis not present

## 2021-06-08 NOTE — Progress Notes (Signed)
?Cardiology Office Note:   ? ?Date:  06/11/2021  ? ?ID:  TYRONNE BLANN, DOB [birth year], MRN [id number] ? ?PCP:  Mosie Lukes, MD  ?Cardiologist:  Buford Dresser, MD PhD ? ?Referring MD: Mosie Lukes, MD  ? ?CC: follow up ? ?History of Present Illness:   ? ?Aaron Dyer is a 56 y.o. male with a hx of hyperlipidemia who is seen in follow up today. He was seen on 04/14/18 as a new consult (last seen by Dr. Stanford Breed in 14-Jun-2011) at the request of Mosie Lukes, MD for the evaluation and management of dyspnea on exertion, chest pain, family history. ? ?Pertinent cardiac history:  ?Had shooting chest pain, sharp, nonexertional. Also has dyspnea on exertion. Has history of hyperlipidemia with multiple statin intolerances, never smoker, no hypertension, no diabetes. Family history: father died at age 2 from cancer, first blockages in his 25s. Mom had 3V CABG and stents in Jun 14, 2006, passed away from dementia in June 13, 2016. Both parents had strokes. Both were smokers. Paternal uncle passed from MI in his 60s. Grandparents had strokes.  ? ?Today: ?He is accompanied by a family member. Overall, he is feeling fine with no new cardiovascular complaints. At home he monitors his blood pressure, which is typically well controlled. ? ?His most recent direct LDL is 173 (04/25/21). Previously 193 at his last visit. He was on Repatha for a while, but this became prohibitively expensive. It was not approved and he also dislikes injections. Currently he is taking Zetia and fenofibrate.  ? ?He denies any palpitations, chest pain, shortness of breath, or peripheral edema. No lightheadedness, headaches, syncope, orthopnea, or PND. ? ? ?Past Medical History:  ?Diagnosis Date  ? Anxiety and depression   ? Arthritis   ? neck  ? Diverticulosis of colon without hemorrhage 12/18/2014  ? ED (erectile dysfunction)   ? GERD (gastroesophageal reflux disease)   ? History of adenomatous polyp of colon   ? History of adenomatous polyp of colon   ?  History of exercise stress test 06/09/2006  ? ECHO ETT-- NORMAL  ? History of melanoma excision   ? nonmalignant and malignant   ? History of nonmelanoma skin cancer dermatologist-  dr williford  ? multiple areas on body,  SCC majority  ? Hyperlipidemia   ? Hypertension   ? IBS (irritable bowel syndrome)   ? Prolapsed internal hemorrhoids, grade 4   ? ? ?Past Surgical History:  ?Procedure Laterality Date  ? COLONOSCOPY  last one 11-10-2014  dr pyrtle  ? EVALUATION UNDER ANESTHESIA WITH HEMORRHOIDECTOMY N/A 10/17/2017  ? Procedure: EXAM UNDER ANESTHESIA WITH HEMORRHOIDECTOMY AND HEMORRHOIDAL LIGATION/PEXY TIMES TWO;  Surgeon: Michael Boston, MD;  Location: [facility];  Service: General;  Laterality: N/A;  ? EXCISION SEBACEOUS CYST  10-18-2013   '@[facility]'$   ? ON BACK  ? FLAP CLOSURE LEFT CHEEK POST WLE  09-18-2012    '@[facility]'$   ? MOHS SURGERY  X2 2008/06/13  ? right ear  ? WIDE LOCAL EXCISION LEFT CHEEK AND LEFT CERVICAL SENITNAL LYMPH NODE BX  09-07-2012    '@[facility]'$   ? ? ?Current Medications: ?Current Outpatient Medications on File Prior to Visit  ?Medication Sig  ? amLODipine (NORVASC) 5 MG tablet Take 1 tablet (5 mg total) by mouth daily.  ? buPROPion (WELLBUTRIN XL) 300 MG 24 hr tablet Take 1 tablet (300 mg total) by mouth daily.  ? cetirizine (EQ ALLERGY RELIEF, CETIRIZINE,) 10 MG tablet TAKE 1 TABLET BY MOUTH ONCE DAILY AS  NEEDED FOR ALLERGIES OR RHINITIS  ? cetirizine (ZYRTEC) 10 MG tablet Take 1 tablet (10 mg total) by mouth daily.  ? cyclobenzaprine (FLEXERIL) 10 MG tablet Take 0.5-1 tablets (5-10 mg total) by mouth 3 (three) times daily as needed for muscle spasms.  ? ezetimibe (ZETIA) 10 MG tablet Take 1 tablet (10 mg total) by mouth daily.  ? famotidine (PEPCID) 40 MG tablet Take 1 tablet (40 mg total) by mouth daily.  ? fenofibrate 54 MG tablet TAKE 1 TABLET BY MOUTH ONCE DAILY WITH SUPPER  ? FLUoxetine (PROZAC) 20 MG capsule Take 1 capsule (20 mg total) by mouth daily.  ? fluticasone (FLONASE) 50 MCG/ACT  nasal spray Place 2 sprays into both nostrils daily as needed.  ? meloxicam (MOBIC) 15 MG tablet Take 1 tablet (15 mg total) by mouth daily.  ? ?No current facility-administered medications on file prior to visit.  ?  ? ?Allergies:   Crestor [rosuvastatin]  ? ?Social History  ? ?Tobacco Use  ? Smoking status: Never  ? Smokeless tobacco: Never  ?Vaping Use  ? Vaping Use: Never used  ?Substance Use Topics  ? Alcohol use: Yes  ?  Alcohol/week: 0.0 standard drinks  ?  Comment: OCCASIONAL  ? Drug use: No  ? ? ?Family History: ?The patient's family history includes Alcohol abuse in his father; COPD in his mother; Cancer in his father; Diabetes in his maternal grandmother and mother; Emphysema in his mother; Heart attack in his maternal grandfather; Heart disease in his father, maternal grandmother, mother, paternal grandfather, and paternal grandmother; Hyperlipidemia in his mother; Hypertension in his father and mother; Stroke in his father and mother. There is no history of Colon cancer, Colon polyps, Esophageal cancer, Rectal cancer, or Stomach cancer. ? ?ROS:   ?Please see the history of present illness.   ?Additional pertinent ROS otherwise unremarkable. ? ? ?EKGs/Labs/Other Studies Reviewed:   ? ?The following studies were reviewed today: ? ?CT coronary 05/25/18 ?Coronary Arteries:  Normal coronary origin.  Right dominance. ?  ?RCA is a non-dominant artery that has minimal soft plaque in the mid ?vessel. ?  ?Left main is a large artery that gives rise to LAD and LCX arteries. ?  ?LAD is a large vessel that has 30-40% mixed plaque in the proximal ?and mid regions and 10-20% calcified plaque distally. There are 4 ?small diagonal vessels. There is 20-30% calcified plaque at the ?ostium of D1, and the diagonals are otherwise without significant ?disease. ?  ?LCX is a large, dominant artery that gives rise to three OM branches ?and the PDA. There is 10-20% calcified plaque in the mid LCX at the ?level of OM1. ?   ?IMPRESSION: ?Coronary calcium score of 192. This was 90th percentile for age ?and sex matched control. ? ?Echo 04/21/18 ?- Left ventricle: The cavity size was normal. Systolic function was ?  normal. The estimated ejection fraction was in the range of 55% ?  to 60%. Wall motion was normal; there were no regional wall ?  motion abnormalities. Doppler parameters are consistent with ?  abnormal left ventricular relaxation (grade 1 diastolic ?  dysfunction). ? ?ETT 2013: ?13 min, 15.4 METs, Peak HR 160 bpm, no ischemia on ECG ? ?Stress echo 2008: ?9 min 32 sec on [place] ?Peak HR 172 bpm ?15 METs ?No ECG changes ?Normal baseline echo, appropriate augmentation with stress ? ? ?EKG:  EKG is personally reviewed.   ?06/11/2021: NSR at 76 bpm ?04/14/18: normal sinus rhythm ? ?Recent Labs: ?05/04/2021:  ALT 17; BUN 19; Creatinine, Ser 1.01; Potassium 4.6; Sodium 139  ? ?Recent Lipid Panel ?   ?Component Value Date/Time  ? CHOL 279 (H) 04/25/2021 1455  ? TRIG 274.0 (H) 04/25/2021 1455  ? HDL 41.10 04/25/2021 1455  ? CHOLHDL 7 04/25/2021 1455  ? VLDL 54.8 (H) 04/25/2021 1455  ? LDLCALC 182 (H) 04/07/2018 1814  ? LDLDIRECT 173.0 04/25/2021 1455  ? ? ?Physical Exam:   ? ?VS:  BP 124/86 (BP Location: Left Arm, Patient Position: Sitting, Cuff Size: Normal)   Pulse 76   Ht '5\' 9"'$  (1.753 m)   Wt 165 lb 12.8 oz (75.2 kg)   BMI 24.48 kg/m?    ? ?Wt Readings from Last 3 Encounters:  ?06/11/21 165 lb 12.8 oz (75.2 kg)  ?06/08/20 170 lb (77.1 kg)  ?05/03/20 174 lb (78.9 kg)  ?  ?GEN: Well nourished, well developed in no acute distress ?HEENT: Normal, moist mucous membranes ?NECK: No JVD ?CARDIAC: regular rhythm, normal S1 and S2, no rubs or gallops. No murmur. ?VASCULAR: Radial and DP pulses 2+ bilaterally. No carotid bruits ?RESPIRATORY:  Clear to auscultation without rales, wheezing or rhonchi  ?ABDOMEN: Soft, non-tender, non-distended ?MUSCULOSKELETAL:  Ambulates independently ?SKIN: Warm and dry, no edema ?NEUROLOGIC:  Alert and  oriented x 3. No focal neuro deficits noted. ?PSYCHIATRIC:  Normal affect  ? ?ASSESSMENT:   ? ?1. Mixed hyperlipidemia   ?2. Nonocclusive coronary atherosclerosis of native coronary artery   ?3. Therapeutic drug monitoring   ?4.

## 2021-06-11 ENCOUNTER — Encounter (HOSPITAL_BASED_OUTPATIENT_CLINIC_OR_DEPARTMENT_OTHER): Payer: Self-pay | Admitting: Cardiology

## 2021-06-11 ENCOUNTER — Encounter (HOSPITAL_BASED_OUTPATIENT_CLINIC_OR_DEPARTMENT_OTHER): Payer: Self-pay

## 2021-06-11 ENCOUNTER — Other Ambulatory Visit: Payer: Self-pay

## 2021-06-11 ENCOUNTER — Ambulatory Visit (INDEPENDENT_AMBULATORY_CARE_PROVIDER_SITE_OTHER): Payer: Medicare Other | Admitting: Cardiology

## 2021-06-11 VITALS — BP 124/86 | HR 76 | Ht 69.0 in | Wt 165.8 lb

## 2021-06-11 DIAGNOSIS — Z8249 Family history of ischemic heart disease and other diseases of the circulatory system: Secondary | ICD-10-CM

## 2021-06-11 DIAGNOSIS — Z5181 Encounter for therapeutic drug level monitoring: Secondary | ICD-10-CM

## 2021-06-11 DIAGNOSIS — I2510 Atherosclerotic heart disease of native coronary artery without angina pectoris: Secondary | ICD-10-CM

## 2021-06-11 DIAGNOSIS — I10 Essential (primary) hypertension: Secondary | ICD-10-CM | POA: Diagnosis not present

## 2021-06-11 DIAGNOSIS — Z7189 Other specified counseling: Secondary | ICD-10-CM | POA: Diagnosis not present

## 2021-06-11 DIAGNOSIS — E782 Mixed hyperlipidemia: Secondary | ICD-10-CM

## 2021-06-11 DIAGNOSIS — E7801 Familial hypercholesterolemia: Secondary | ICD-10-CM

## 2021-06-11 MED ORDER — NEXLETOL 180 MG PO TABS: 180.0000 mg | ORAL_TABLET | Freq: Every day | ORAL | 11 refills | Status: DC

## 2021-06-11 NOTE — Patient Instructions (Addendum)
Medication Instructions:  ?START NEXLETOL DAILY  ? ?[email or web address] ? ?*If you need a refill on your cardiac medications before your next appointment, please call your pharmacy* ? ?Lab Work: ?LP/HFP 2-3  MONTHS  ? ?If you have labs (blood work) drawn today and your tests are completely normal, you will receive your results only by: ?MyChart Message (if you have MyChart) OR ?A paper copy in the mail ?If you have any lab test that is abnormal or we need to change your treatment, we will call you to review the results. ? ?Testing/Procedures: ?NONE ? ?Follow-Up: ?At [facility], you and your health needs are our priority.  As part of our continuing mission to provide you with exceptional heart care, we have created designated Provider Care Teams.  These Care Teams include your primary Cardiologist (physician) and Advanced Practice Providers (APPs -  Physician Assistants and Nurse Practitioners) who all work together to provide you with the care you need, when you need it. ? ?We recommend signing up for the patient portal called "MyChart".  Sign up information is provided on this After Visit Summary.  MyChart is used to connect with patients for Virtual Visits (Telemedicine).  Patients are able to view lab/test results, encounter notes, upcoming appointments, etc.  Non-urgent messages can be sent to your provider as well.   ?To learn more about what you can do with MyChart, go to [email or web address].   ? ?Your next appointment:   ?12 month(s) ? ?The format for your next appointment:   ?In Person ? ?Provider:   ?Buford Dresser, MD{ ? ?

## 2021-06-12 NOTE — Telephone Encounter (Signed)
Please advise 

## 2021-06-15 ENCOUNTER — Telehealth: Payer: Self-pay | Admitting: Family Medicine

## 2021-06-15 DIAGNOSIS — C44729 Squamous cell carcinoma of skin of left lower limb, including hip: Secondary | ICD-10-CM | POA: Diagnosis not present

## 2021-06-15 DIAGNOSIS — C44629 Squamous cell carcinoma of skin of left upper limb, including shoulder: Secondary | ICD-10-CM | POA: Diagnosis not present

## 2021-06-15 NOTE — Telephone Encounter (Signed)
Left message for patient to call back and schedule Medicare Annual Wellness Visit (AWV) in office.  ? ?If not able to come in office, please offer to do virtually or by telephone.  Left office number and my jabber [phone]. ? ?Last AWV:06/03/2017 ? ?Please schedule at anytime with Nurse Health Advisor. ?  ?

## 2021-06-15 NOTE — Telephone Encounter (Signed)
Please advise 

## 2021-06-15 NOTE — Telephone Encounter (Signed)
RN to follow up w/ MD in Clinic ?

## 2021-07-02 DIAGNOSIS — Z23 Encounter for immunization: Secondary | ICD-10-CM | POA: Diagnosis not present

## 2021-07-02 DIAGNOSIS — S51811A Laceration without foreign body of right forearm, initial encounter: Secondary | ICD-10-CM | POA: Diagnosis not present

## 2021-07-05 ENCOUNTER — Telehealth: Payer: Self-pay | Admitting: Pharmacist Clinician (PhC)/ Clinical Pharmacy Specialist

## 2021-07-05 NOTE — Telephone Encounter (Signed)
-----   Message from Buford Dresser, MD sent at 06/11/2021  4:32 PM EDT ----- ?Regarding: lipid and meds ?Hello, you saw this gentleman in the past. He stopped repatha because he did not like injections. I'm having him trial nexletol but he doesn't have prescription drug coverage, so I don't know that we can make that affordable. Is he one of the people that would have inclisiran covered? I don't think I can get him back on repatha but I tried to sell less frequent injections. ? ?I appreciate any help you can give!!!! ? ?Thanks, ?Bridgette ? ?

## 2021-07-10 DIAGNOSIS — Z20822 Contact with and (suspected) exposure to covid-19: Secondary | ICD-10-CM | POA: Diagnosis not present

## 2021-07-13 DIAGNOSIS — Z4802 Encounter for removal of sutures: Secondary | ICD-10-CM | POA: Diagnosis not present

## 2021-07-21 ENCOUNTER — Other Ambulatory Visit: Payer: Self-pay | Admitting: Medical

## 2021-08-08 DIAGNOSIS — Z85820 Personal history of malignant melanoma of skin: Secondary | ICD-10-CM | POA: Diagnosis not present

## 2021-08-08 DIAGNOSIS — L570 Actinic keratosis: Secondary | ICD-10-CM | POA: Diagnosis not present

## 2021-08-08 DIAGNOSIS — D229 Melanocytic nevi, unspecified: Secondary | ICD-10-CM | POA: Diagnosis not present

## 2021-08-08 DIAGNOSIS — L821 Other seborrheic keratosis: Secondary | ICD-10-CM | POA: Diagnosis not present

## 2021-08-08 DIAGNOSIS — L578 Other skin changes due to chronic exposure to nonionizing radiation: Secondary | ICD-10-CM | POA: Diagnosis not present

## 2021-08-08 DIAGNOSIS — L720 Epidermal cyst: Secondary | ICD-10-CM | POA: Diagnosis not present

## 2021-08-08 DIAGNOSIS — L739 Follicular disorder, unspecified: Secondary | ICD-10-CM | POA: Diagnosis not present

## 2021-08-08 DIAGNOSIS — Z85828 Personal history of other malignant neoplasm of skin: Secondary | ICD-10-CM | POA: Diagnosis not present

## 2021-08-19 ENCOUNTER — Other Ambulatory Visit: Payer: Self-pay | Admitting: Medical

## 2021-09-03 DIAGNOSIS — L720 Epidermal cyst: Secondary | ICD-10-CM | POA: Diagnosis not present

## 2021-09-16 ENCOUNTER — Other Ambulatory Visit: Payer: Self-pay | Admitting: Family Medicine

## 2021-10-25 ENCOUNTER — Other Ambulatory Visit: Payer: Self-pay | Admitting: Medical

## 2021-10-25 ENCOUNTER — Other Ambulatory Visit: Payer: Self-pay | Admitting: Family Medicine

## 2021-10-25 DIAGNOSIS — F418 Other specified anxiety disorders: Secondary | ICD-10-CM

## 2021-10-25 DIAGNOSIS — E785 Hyperlipidemia, unspecified: Secondary | ICD-10-CM

## 2021-11-06 ENCOUNTER — Telehealth: Payer: Self-pay | Admitting: Family Medicine

## 2021-11-06 NOTE — Telephone Encounter (Signed)
Left message for patient to call back and schedule Medicare Annual Wellness Visit (AWV).   Please offer to do virtually or by telephone.  Left office number and my jabber [phone].  Last AWV:06/03/2017  Please schedule at anytime with Nurse Health Advisor.

## 2021-12-26 ENCOUNTER — Other Ambulatory Visit: Payer: Self-pay | Admitting: Family Medicine

## 2021-12-26 DIAGNOSIS — W11XXXA Fall on and from ladder, initial encounter: Secondary | ICD-10-CM | POA: Diagnosis not present

## 2021-12-26 DIAGNOSIS — M25511 Pain in right shoulder: Secondary | ICD-10-CM | POA: Diagnosis not present

## 2021-12-26 DIAGNOSIS — E785 Hyperlipidemia, unspecified: Secondary | ICD-10-CM

## 2021-12-26 DIAGNOSIS — F418 Other specified anxiety disorders: Secondary | ICD-10-CM

## 2021-12-27 DIAGNOSIS — M25511 Pain in right shoulder: Secondary | ICD-10-CM | POA: Diagnosis not present

## 2022-01-09 DIAGNOSIS — D229 Melanocytic nevi, unspecified: Secondary | ICD-10-CM | POA: Diagnosis not present

## 2022-01-09 DIAGNOSIS — L821 Other seborrheic keratosis: Secondary | ICD-10-CM | POA: Diagnosis not present

## 2022-01-09 DIAGNOSIS — L578 Other skin changes due to chronic exposure to nonionizing radiation: Secondary | ICD-10-CM | POA: Diagnosis not present

## 2022-01-09 DIAGNOSIS — L570 Actinic keratosis: Secondary | ICD-10-CM | POA: Diagnosis not present

## 2022-01-09 DIAGNOSIS — Z85820 Personal history of malignant melanoma of skin: Secondary | ICD-10-CM | POA: Diagnosis not present

## 2022-01-11 DIAGNOSIS — M25511 Pain in right shoulder: Secondary | ICD-10-CM | POA: Diagnosis not present

## 2022-01-21 ENCOUNTER — Other Ambulatory Visit: Payer: Self-pay | Admitting: Family Medicine

## 2022-01-25 ENCOUNTER — Other Ambulatory Visit: Payer: Self-pay | Admitting: Family Medicine

## 2022-01-25 DIAGNOSIS — F418 Other specified anxiety disorders: Secondary | ICD-10-CM

## 2022-01-25 DIAGNOSIS — E785 Hyperlipidemia, unspecified: Secondary | ICD-10-CM

## 2022-01-27 DIAGNOSIS — M25511 Pain in right shoulder: Secondary | ICD-10-CM | POA: Diagnosis not present

## 2022-01-30 DIAGNOSIS — M25511 Pain in right shoulder: Secondary | ICD-10-CM | POA: Diagnosis not present

## 2022-02-07 DIAGNOSIS — M75101 Unspecified rotator cuff tear or rupture of right shoulder, not specified as traumatic: Secondary | ICD-10-CM | POA: Diagnosis not present

## 2022-02-18 DIAGNOSIS — L570 Actinic keratosis: Secondary | ICD-10-CM | POA: Diagnosis not present

## 2022-02-18 DIAGNOSIS — L209 Atopic dermatitis, unspecified: Secondary | ICD-10-CM | POA: Diagnosis not present

## 2022-02-21 ENCOUNTER — Other Ambulatory Visit: Payer: Self-pay | Admitting: Medical

## 2022-02-21 DIAGNOSIS — S43431A Superior glenoid labrum lesion of right shoulder, initial encounter: Secondary | ICD-10-CM | POA: Diagnosis not present

## 2022-02-21 DIAGNOSIS — M75121 Complete rotator cuff tear or rupture of right shoulder, not specified as traumatic: Secondary | ICD-10-CM | POA: Diagnosis not present

## 2022-02-21 DIAGNOSIS — M7551 Bursitis of right shoulder: Secondary | ICD-10-CM | POA: Diagnosis not present

## 2022-02-21 DIAGNOSIS — M19011 Primary osteoarthritis, right shoulder: Secondary | ICD-10-CM | POA: Diagnosis not present

## 2022-02-21 DIAGNOSIS — M24111 Other articular cartilage disorders, right shoulder: Secondary | ICD-10-CM | POA: Diagnosis not present

## 2022-02-21 DIAGNOSIS — M24011 Loose body in right shoulder: Secondary | ICD-10-CM | POA: Diagnosis not present

## 2022-02-21 DIAGNOSIS — G8918 Other acute postprocedural pain: Secondary | ICD-10-CM | POA: Diagnosis not present

## 2022-02-21 DIAGNOSIS — M7541 Impingement syndrome of right shoulder: Secondary | ICD-10-CM | POA: Diagnosis not present

## 2022-02-21 DIAGNOSIS — M7521 Bicipital tendinitis, right shoulder: Secondary | ICD-10-CM | POA: Diagnosis not present

## 2022-03-07 DIAGNOSIS — M25511 Pain in right shoulder: Secondary | ICD-10-CM | POA: Diagnosis not present

## 2022-03-07 DIAGNOSIS — M25611 Stiffness of right shoulder, not elsewhere classified: Secondary | ICD-10-CM | POA: Diagnosis not present

## 2022-03-13 DIAGNOSIS — M25511 Pain in right shoulder: Secondary | ICD-10-CM | POA: Diagnosis not present

## 2022-03-20 ENCOUNTER — Telehealth: Payer: Medicare Other | Admitting: Physician Assistant

## 2022-03-20 DIAGNOSIS — R6889 Other general symptoms and signs: Secondary | ICD-10-CM

## 2022-03-20 MED ORDER — BENZONATATE 100 MG PO CAPS: 100.0000 mg | ORAL_CAPSULE | Freq: Three times a day (TID) | ORAL | 0 refills | Status: DC | PRN

## 2022-03-20 NOTE — Progress Notes (Signed)
E visit for Flu like symptoms   We are sorry that you are not feeling well.  Here is how we plan to help! Based on what you have shared with me it looks like you may have a respiratory virus that may be influenza.  Influenza or "the flu" is   an infection caused by a respiratory virus. The flu virus is highly contagious and persons who did not receive their yearly flu vaccination may "catch" the flu from close contact.  We have anti-viral medications to treat the viruses that cause this infection. They are not a "cure" and only shorten the course of the infection. These prescriptions are most effective when they are given within the first 2 days of "flu" symptoms. Antiviral medication are indicated if you have a high risk of complications from the flu. You should  also consider an antiviral medication if you are in close contact with someone who is at risk. These medications can help patients avoid complications from the flu  but have side effects that you should know. Possible side effects from Tamiflu or oseltamivir include nausea, vomiting, diarrhea, dizziness, headaches, eye redness, sleep problems or other respiratory symptoms. You should not take Tamiflu if you have an allergy to oseltamivir or any to the ingredients in Tamiflu.  Based upon your symptoms and potential risk factors I recommend that you follow the flu symptoms recommendation that I have listed below.  I do recommend COVID testing at home to be cautious. You are outside the time window for an antiviral so we need to treat symptoms while your body recovers from this, as long as that home COVID test is negative. If positive, let us know ASAP.  Please keep well-hydrated and try to get plenty of rest. If you have a humidifier, place it in the bedroom and run it at night. Start a saline nasal rinse for nasal congestion. You can consider use of a nasal steroid spray like Flonase or Nasacort OTC. You can alternate between Tylenol and  Ibuprofen if needed for fever, body aches, headache and/or throat pain. Salt water-gargles and chloraseptic spray can be very beneficial for sore throat. Mucinex-DM for congestion or cough. Please take all prescribed medications as directed - I have sent in a prescription cough medication that is ok to take with the OTC medications recommended. Remain out of work until [employer] for 24 hours without a fever-reducing medication, and you are feeling better.  You should mask until symptoms are resolved.  If anything worsens despite treatment, you need to be evaluated in-person. Please do not delay care.     ANYONE WHO HAS FLU SYMPTOMS SHOULD: Stay home. The flu is highly contagious and going out or to work exposes others! Be sure to drink plenty of fluids. Water is fine as well as fruit juices, sodas and electrolyte beverages. You may want to stay away from caffeine or alcohol. If you are nauseated, try taking small sips of liquids. How do you know if you are getting enough fluid? Your urine should be a pale yellow or almost colorless. Get rest. Taking a steamy shower or using a humidifier may help nasal congestion and ease sore throat pain. Using a saline nasal spray works much the same way. Cough drops, hard candies and sore throat lozenges may ease your cough. Line up a caregiver. Have someone check on you regularly.   GET HELP RIGHT AWAY IF: You cannot keep down liquids or your medications. You become short of breath Your fell  like you are going to pass out or loose consciousness. Your symptoms persist after you have completed your treatment plan MAKE SURE YOU  Understand these instructions. Will watch your condition. Will get help right away if you are not doing well or get worse.  Your e-visit answers were reviewed by a board certified advanced clinical practitioner to complete your personal care plan.  Depending on the condition, your plan could have included both over the counter  or prescription medications.  If there is a problem please reply  once you have received a response from your provider.  Your safety is important to [place].  If you have drug allergies check your prescription carefully.    You can use MyChart to ask questions about today's visit, request a non-urgent call back, or ask for a work or school excuse for 24 hours related to this e-Visit. If it has been greater than 24 hours you will need to follow up with your provider, or enter a new e-Visit to address those concerns.  You will get an e-mail in the next two days asking about your experience.  I hope that your e-visit has been valuable and will speed your recovery. Thank you for using e-visits.

## 2022-03-20 NOTE — Progress Notes (Signed)
I have spent 5 minutes in review of e-visit questionnaire, review and updating patient chart, medical decision making and response to patient.   Kramer Cody Chakia Counts, PA-C    

## 2022-03-27 DIAGNOSIS — M25511 Pain in right shoulder: Secondary | ICD-10-CM | POA: Diagnosis not present

## 2022-03-29 DIAGNOSIS — M25511 Pain in right shoulder: Secondary | ICD-10-CM | POA: Diagnosis not present

## 2022-03-29 DIAGNOSIS — M25611 Stiffness of right shoulder, not elsewhere classified: Secondary | ICD-10-CM | POA: Diagnosis not present

## 2022-04-02 DIAGNOSIS — M25511 Pain in right shoulder: Secondary | ICD-10-CM | POA: Diagnosis not present

## 2022-04-04 DIAGNOSIS — M25511 Pain in right shoulder: Secondary | ICD-10-CM | POA: Diagnosis not present

## 2022-04-04 DIAGNOSIS — M25611 Stiffness of right shoulder, not elsewhere classified: Secondary | ICD-10-CM | POA: Diagnosis not present

## 2022-04-08 ENCOUNTER — Other Ambulatory Visit: Payer: Self-pay | Admitting: Medical

## 2022-04-12 DIAGNOSIS — M25511 Pain in right shoulder: Secondary | ICD-10-CM | POA: Diagnosis not present

## 2022-04-12 DIAGNOSIS — M25611 Stiffness of right shoulder, not elsewhere classified: Secondary | ICD-10-CM | POA: Diagnosis not present

## 2022-04-15 ENCOUNTER — Other Ambulatory Visit: Payer: Self-pay | Admitting: Medical

## 2022-04-15 ENCOUNTER — Other Ambulatory Visit: Payer: Self-pay | Admitting: Family Medicine

## 2022-04-18 DIAGNOSIS — M25511 Pain in right shoulder: Secondary | ICD-10-CM | POA: Diagnosis not present

## 2022-04-18 DIAGNOSIS — M25611 Stiffness of right shoulder, not elsewhere classified: Secondary | ICD-10-CM | POA: Diagnosis not present

## 2022-04-22 ENCOUNTER — Other Ambulatory Visit: Payer: Self-pay | Admitting: Family Medicine

## 2022-04-22 DIAGNOSIS — M25611 Stiffness of right shoulder, not elsewhere classified: Secondary | ICD-10-CM | POA: Diagnosis not present

## 2022-04-22 DIAGNOSIS — M25511 Pain in right shoulder: Secondary | ICD-10-CM | POA: Diagnosis not present

## 2022-04-25 DIAGNOSIS — M25611 Stiffness of right shoulder, not elsewhere classified: Secondary | ICD-10-CM | POA: Diagnosis not present

## 2022-05-02 DIAGNOSIS — M25511 Pain in right shoulder: Secondary | ICD-10-CM | POA: Diagnosis not present

## 2022-05-02 DIAGNOSIS — M25611 Stiffness of right shoulder, not elsewhere classified: Secondary | ICD-10-CM | POA: Diagnosis not present

## 2022-05-07 ENCOUNTER — Other Ambulatory Visit: Payer: Self-pay | Admitting: Family Medicine

## 2022-05-09 DIAGNOSIS — M25511 Pain in right shoulder: Secondary | ICD-10-CM | POA: Diagnosis not present

## 2022-05-09 DIAGNOSIS — M25611 Stiffness of right shoulder, not elsewhere classified: Secondary | ICD-10-CM | POA: Diagnosis not present

## 2022-05-12 ENCOUNTER — Other Ambulatory Visit: Payer: Self-pay | Admitting: Medical

## 2022-05-21 ENCOUNTER — Other Ambulatory Visit: Payer: Self-pay | Admitting: Family Medicine

## 2022-05-21 DIAGNOSIS — E785 Hyperlipidemia, unspecified: Secondary | ICD-10-CM

## 2022-05-21 DIAGNOSIS — F418 Other specified anxiety disorders: Secondary | ICD-10-CM

## 2022-05-22 ENCOUNTER — Encounter: Payer: Self-pay | Admitting: *Deleted

## 2022-05-30 DIAGNOSIS — Z4789 Encounter for other orthopedic aftercare: Secondary | ICD-10-CM | POA: Diagnosis not present

## 2022-06-16 ENCOUNTER — Other Ambulatory Visit: Payer: Self-pay | Admitting: Medical

## 2022-06-20 ENCOUNTER — Other Ambulatory Visit: Payer: Self-pay | Admitting: Medical

## 2022-06-20 ENCOUNTER — Other Ambulatory Visit: Payer: Self-pay | Admitting: Family Medicine

## 2022-06-20 DIAGNOSIS — F418 Other specified anxiety disorders: Secondary | ICD-10-CM

## 2022-06-20 DIAGNOSIS — E785 Hyperlipidemia, unspecified: Secondary | ICD-10-CM

## 2022-07-19 ENCOUNTER — Other Ambulatory Visit: Payer: Self-pay | Admitting: Medical

## 2022-07-22 ENCOUNTER — Other Ambulatory Visit: Payer: Self-pay | Admitting: Family Medicine

## 2022-07-22 ENCOUNTER — Other Ambulatory Visit: Payer: Self-pay | Admitting: Medical

## 2022-07-22 DIAGNOSIS — F418 Other specified anxiety disorders: Secondary | ICD-10-CM

## 2022-07-22 DIAGNOSIS — E785 Hyperlipidemia, unspecified: Secondary | ICD-10-CM

## 2022-07-31 ENCOUNTER — Other Ambulatory Visit: Payer: Self-pay | Admitting: Family Medicine

## 2022-07-31 DIAGNOSIS — F418 Other specified anxiety disorders: Secondary | ICD-10-CM

## 2022-07-31 DIAGNOSIS — E785 Hyperlipidemia, unspecified: Secondary | ICD-10-CM

## 2022-08-06 ENCOUNTER — Other Ambulatory Visit: Payer: Self-pay | Admitting: Family Medicine

## 2022-08-06 ENCOUNTER — Encounter: Payer: Self-pay | Admitting: Family Medicine

## 2022-08-06 DIAGNOSIS — E785 Hyperlipidemia, unspecified: Secondary | ICD-10-CM

## 2022-08-06 DIAGNOSIS — F418 Other specified anxiety disorders: Secondary | ICD-10-CM

## 2022-08-06 MED ORDER — FLUOXETINE HCL 20 MG PO CAPS
20.0000 mg | ORAL_CAPSULE | Freq: Every day | ORAL | 0 refills | Status: DC
Start: 2022-08-06 — End: 2022-12-28

## 2022-09-04 ENCOUNTER — Other Ambulatory Visit: Payer: Self-pay | Admitting: Family Medicine

## 2022-10-04 ENCOUNTER — Other Ambulatory Visit: Payer: Self-pay | Admitting: Medical

## 2022-10-08 DIAGNOSIS — L578 Other skin changes due to chronic exposure to nonionizing radiation: Secondary | ICD-10-CM | POA: Diagnosis not present

## 2022-10-08 DIAGNOSIS — L821 Other seborrheic keratosis: Secondary | ICD-10-CM | POA: Diagnosis not present

## 2022-10-08 DIAGNOSIS — L814 Other melanin hyperpigmentation: Secondary | ICD-10-CM | POA: Diagnosis not present

## 2022-10-08 DIAGNOSIS — L570 Actinic keratosis: Secondary | ICD-10-CM | POA: Diagnosis not present

## 2022-10-08 DIAGNOSIS — D229 Melanocytic nevi, unspecified: Secondary | ICD-10-CM | POA: Diagnosis not present

## 2022-10-18 ENCOUNTER — Other Ambulatory Visit: Payer: Self-pay | Admitting: Family Medicine

## 2022-10-19 ENCOUNTER — Other Ambulatory Visit: Payer: Self-pay | Admitting: Family Medicine

## 2022-10-24 ENCOUNTER — Other Ambulatory Visit: Payer: Self-pay | Admitting: Family Medicine

## 2022-11-24 ENCOUNTER — Other Ambulatory Visit: Payer: Self-pay | Admitting: Family Medicine

## 2022-11-24 ENCOUNTER — Other Ambulatory Visit: Payer: Self-pay | Admitting: Medical

## 2022-11-28 ENCOUNTER — Other Ambulatory Visit: Payer: Self-pay | Admitting: Family Medicine

## 2022-11-28 ENCOUNTER — Other Ambulatory Visit: Payer: Self-pay | Admitting: Medical

## 2022-12-03 ENCOUNTER — Other Ambulatory Visit: Payer: Self-pay | Admitting: Family Medicine

## 2022-12-04 MED ORDER — BUPROPION HCL ER (XL) 300 MG PO TB24: 300.0000 mg | ORAL_TABLET | Freq: Every day | ORAL | 0 refills | Status: DC

## 2022-12-11 ENCOUNTER — Other Ambulatory Visit: Payer: Self-pay | Admitting: Family Medicine

## 2022-12-11 ENCOUNTER — Other Ambulatory Visit: Payer: Self-pay | Admitting: Medical

## 2022-12-22 DIAGNOSIS — G720 Drug-induced myopathy: Secondary | ICD-10-CM | POA: Insufficient documentation

## 2022-12-22 NOTE — Assessment & Plan Note (Signed)
Well controlled, no changes to meds. Encouraged heart healthy diet such as the DASH diet and exercise as tolerated.  °

## 2022-12-22 NOTE — Assessment & Plan Note (Signed)
Encourage heart healthy diet such as MIND or DASH diet, increase exercise, avoid trans fats, simple carbohydrates and processed foods, consider a krill or fish or flaxseed oil cap daily.  °

## 2022-12-22 NOTE — Assessment & Plan Note (Signed)
Hydrate and monitor 

## 2022-12-22 NOTE — Assessment & Plan Note (Signed)
Myalgias on several statins

## 2022-12-22 NOTE — Assessment & Plan Note (Signed)
Recurrent and follows closely with dermatology

## 2022-12-23 ENCOUNTER — Encounter: Payer: Self-pay | Admitting: Family Medicine

## 2022-12-23 ENCOUNTER — Ambulatory Visit (INDEPENDENT_AMBULATORY_CARE_PROVIDER_SITE_OTHER): Payer: Medicare Other | Admitting: Family Medicine

## 2022-12-23 VITALS — BP 142/82 | HR 98 | Temp 98.0°F | Resp 16 | Ht 69.0 in | Wt 155.6 lb

## 2022-12-23 DIAGNOSIS — M791 Myalgia, unspecified site: Secondary | ICD-10-CM | POA: Diagnosis not present

## 2022-12-23 DIAGNOSIS — T466X5A Adverse effect of antihyperlipidemic and antiarteriosclerotic drugs, initial encounter: Secondary | ICD-10-CM

## 2022-12-23 DIAGNOSIS — M21611 Bunion of right foot: Secondary | ICD-10-CM | POA: Diagnosis not present

## 2022-12-23 DIAGNOSIS — G4700 Insomnia, unspecified: Secondary | ICD-10-CM | POA: Diagnosis not present

## 2022-12-23 DIAGNOSIS — M79674 Pain in right toe(s): Secondary | ICD-10-CM | POA: Diagnosis not present

## 2022-12-23 DIAGNOSIS — F419 Anxiety disorder, unspecified: Secondary | ICD-10-CM | POA: Diagnosis not present

## 2022-12-23 DIAGNOSIS — R0609 Other forms of dyspnea: Secondary | ICD-10-CM | POA: Diagnosis not present

## 2022-12-23 DIAGNOSIS — G8929 Other chronic pain: Secondary | ICD-10-CM

## 2022-12-23 DIAGNOSIS — M2550 Pain in unspecified joint: Secondary | ICD-10-CM | POA: Insufficient documentation

## 2022-12-23 DIAGNOSIS — I10 Essential (primary) hypertension: Secondary | ICD-10-CM

## 2022-12-23 DIAGNOSIS — C4490 Unspecified malignant neoplasm of skin, unspecified: Secondary | ICD-10-CM

## 2022-12-23 DIAGNOSIS — E7801 Familial hypercholesterolemia: Secondary | ICD-10-CM

## 2022-12-23 DIAGNOSIS — E782 Mixed hyperlipidemia: Secondary | ICD-10-CM | POA: Diagnosis not present

## 2022-12-23 DIAGNOSIS — F32A Depression, unspecified: Secondary | ICD-10-CM

## 2022-12-23 DIAGNOSIS — G720 Drug-induced myopathy: Secondary | ICD-10-CM | POA: Diagnosis not present

## 2022-12-23 DIAGNOSIS — M25511 Pain in right shoulder: Secondary | ICD-10-CM | POA: Insufficient documentation

## 2022-12-23 LAB — CBC WITH DIFFERENTIAL/PLATELET
Basophils Absolute: 0.1 10*3/uL (ref 0.0–0.1)
Basophils Relative: 0.7 % (ref 0.0–3.0)
Eosinophils Absolute: 0.1 10*3/uL (ref 0.0–0.7)
Eosinophils Relative: 1.4 % (ref 0.0–5.0)
HCT: 46.8 % (ref 39.0–52.0)
Hemoglobin: 15.6 g/dL (ref 13.0–17.0)
Lymphocytes Relative: 29.4 % (ref 12.0–46.0)
Lymphs Abs: 2.7 10*3/uL (ref 0.7–4.0)
MCHC: 33.2 g/dL (ref 30.0–36.0)
MCV: 93.5 fL (ref 78.0–100.0)
Monocytes Absolute: 0.4 10*3/uL (ref 0.1–1.0)
Monocytes Relative: 4.5 % (ref 3.0–12.0)
Neutro Abs: 6 10*3/uL (ref 1.4–7.7)
Neutrophils Relative %: 64 % (ref 43.0–77.0)
Platelets: 289 10*3/uL (ref 150.0–400.0)
RBC: 5.01 Mil/uL (ref 4.22–5.81)
RDW: 14 % (ref 11.5–15.5)
WBC: 9.3 10*3/uL (ref 4.0–10.5)

## 2022-12-23 LAB — COMPREHENSIVE METABOLIC PANEL
ALT: 17 U/L (ref 0–53)
AST: 15 U/L (ref 0–37)
Albumin: 4.8 g/dL (ref 3.5–5.2)
Alkaline Phosphatase: 82 U/L (ref 39–117)
BUN: 16 mg/dL (ref 6–23)
CO2: 29 meq/L (ref 19–32)
Calcium: 9.9 mg/dL (ref 8.4–10.5)
Chloride: 103 meq/L (ref 96–112)
Creatinine, Ser: 0.89 mg/dL (ref 0.40–1.50)
GFR: 94.86 mL/min (ref >60.00)
Glucose, Bld: 98 mg/dL (ref 70–99)
Potassium: 5.1 meq/L (ref 3.5–5.1)
Sodium: 138 meq/L (ref 135–145)
Total Bilirubin: 0.5 mg/dL (ref 0.2–1.2)
Total Protein: 7.2 g/dL (ref 6.0–8.3)

## 2022-12-23 LAB — TSH: TSH: 3 u[IU]/mL (ref 0.35–5.50)

## 2022-12-23 LAB — LIPID PANEL
Cholesterol: 213 mg/dL — ABNORMAL HIGH (ref 0–200)
HDL: 47.4 mg/dL (ref >39.00)
LDL Cholesterol: 144 mg/dL — ABNORMAL HIGH (ref 0–99)
NonHDL: 165.68
Total CHOL/HDL Ratio: 4
Triglycerides: 106 mg/dL (ref 0.0–149.0)
VLDL: 21.2 mg/dL (ref 0.0–40.0)

## 2022-12-23 LAB — MAGNESIUM: Magnesium: 2.1 mg/dL (ref 1.5–2.5)

## 2022-12-23 MED ORDER — BUPROPION HCL ER (XL) 300 MG PO TB24: 300.0000 mg | ORAL_TABLET | Freq: Every day | ORAL | 5 refills | Status: DC

## 2022-12-23 MED ORDER — HYDROXYZINE HCL 10 MG PO TABS: 10.0000 mg | ORAL_TABLET | Freq: Two times a day (BID) | ORAL | 1 refills | Status: DC | PRN

## 2022-12-23 NOTE — Assessment & Plan Note (Signed)
Had a rotator cuff repair in past and it is acting up again, is considering replacement will let us know when ready for referral to [facility] ortho

## 2022-12-23 NOTE — Assessment & Plan Note (Signed)
Encouraged good sleep hygiene such as dark, quiet room. No blue/green glowing lights such as computer screens in bedroom. No alcohol or stimulants in evening. Cut down on caffeine as able. Regular exercise is helpful but not just prior to bed time.  

## 2022-12-23 NOTE — Patient Instructions (Addendum)
Shingrix is the new shingles shot, 2 shots over 2-6 months, confirm coverage with insurance and document, then can return here for shots with nurse appt or at pharmacy   Covid and flu consider  Managing Loss, Adult People experience loss in many different ways throughout their lives. Events such as moving, changing jobs, and losing friends can create a sense of loss. The loss may be as serious as a major health change, divorce, death of a pet, or death of a loved one. All of these types of loss are likely to create a physical and emotional reaction known as grief. Grief is the result of a major change or an absence of something or someone that you count on. Grief is a normal reaction to loss. A variety of factors can affect your grieving experience, including: The nature of your loss. Your relationship to what or whom you lost. Your understanding of grief and how to manage it. Your support system. Be aware that when grief becomes extreme, it can lead to more severe issues like isolation, depression, anxiety, or suicidal thoughts. Talk with your health care provider if you have any of these issues. How to manage lifestyle changes Keep to your normal routine as much as possible. If you have trouble focusing or doing normal activities, it is acceptable to take some time away from your normal routine. Spend time with friends and loved ones. Eat a healthy diet, get plenty of sleep, and rest when you feel tired. How to recognize changes  The way that you deal with your grief will affect your ability to function as you normally do. When grieving, you may experience these changes: Numbness, shock, sadness, anxiety, anger, denial, and guilt. Thoughts about death. Unexpected crying. A physical sensation of emptiness in your stomach. Problems sleeping and eating. Tiredness (fatigue). Loss of interest in normal activities. Dreaming about or imagining seeing the person who died. A need to remember what  or whom you lost. Difficulty thinking about anything other than your loss for a period of time. Relief. If you have been expecting the loss for a while, you may feel a sense of relief when it happens. Follow these instructions at home: Activity Express your feelings in healthy ways, such as: Talking with others about your loss. It may be helpful to find others who have had a similar loss, such as a support group. Writing down your feelings in a journal. Doing physical activities to release stress and emotional energy. Doing creative activities like painting, sculpting, or playing or listening to music. Practicing resilience. This is the ability to recover and adjust after facing challenges. Reading some resources that encourage resilience may help you to learn ways to practice those behaviors.  General instructions Be patient with yourself and others. Allow the grieving process to happen, and remember that grieving takes time. It is likely that you may never feel completely done with some grief. You may find a way to move on while still cherishing memories and feelings about your loss. Accepting your loss is a process. It can take months or longer to adjust. Keep all follow-up visits. This is important. Where to find support To get support for managing loss: Ask your health care provider for help and recommendations, such as grief counseling or therapy. Think about joining a support group for people who are managing a loss. Where to find more information You can find more information about managing loss from: American Society of Clinical Oncology: www.cancer.net American Psychological Association: [employer]  Contact a health care provider if: Your grief is extreme and keeps getting worse. You have ongoing grief that does not improve. Your body shows symptoms of grief, such as illness. You feel depressed, anxious, or hopeless. Get help right away if: You have thoughts about hurting  yourself or others. Get help right away if you feel like you may hurt yourself or others, or have thoughts about taking your own life. Go to your nearest emergency room or: Call 911. Call the National Suicide Prevention Lifeline at (662)474-6035 or 988. This is open 24 hours a day. Text the Crisis Text Line at (801)065-7655. Summary Grief is the result of a major change or an absence of someone or something that you count on. Grief is a normal reaction to loss. The depth of grief and the period of recovery depend on the type of loss and your ability to adjust to the change and process your feelings. Processing grief requires patience and a willingness to accept your feelings and talk about your loss with people who are supportive. It is important to find resources that work for you and to realize that people experience grief differently. There is not one grieving process that works for everyone in the same way. Be aware that when grief becomes extreme, it can lead to more severe issues like isolation, depression, anxiety, or suicidal thoughts. Talk with your health care provider if you have any of these issues. This information is not intended to replace advice given to you by your health care provider. Make sure you discuss any questions you have with your health care provider. Document Revised: 10/30/2020 Document Reviewed: 10/30/2020 Elsevier Patient Education  2024 [employer].

## 2022-12-23 NOTE — Assessment & Plan Note (Signed)
Severe grief reaction after losing a couple of close family members. He has been out

## 2022-12-23 NOTE — Progress Notes (Signed)
Subjective:    Patient ID: Aaron Dyer, male    DOB: [birth year], 57 y.o.   MRN: [id number]  Chief Complaint  Patient presents with   Follow-up    Follow up    HPI Discussed the use of AI scribe software for clinical note transcription with the patient, who gave verbal consent to proceed.  History of Present Illness   The patient, with a history of rotator cuff surgery, presents with multiple complaints. The primary concern is joint pain, particularly in the fingers and the right foot. The patient describes the pain as constant, with stiffness and tingling sensation in the finger joints. The pain in the right foot is localized to the big toe and is exacerbated by walking. The patient also reports difficulty sleeping, with trouble both falling asleep and staying asleep.  In addition to these physical symptoms, the patient is experiencing emotional distress. They report an increase in smoking due to feelings of depression and a lack of appetite. The patient also mentions shortness of breath, particularly when active or exerting themselves. The patient's sleep issues and joint pain have been ongoing, and the patient has previously been on sleep medication.  The patient also reports pain in the area of a previous rotator cuff surgery. The pain has been increasing recently, but the patient is hesitant to seek orthopedic consultation. The patient also mentions night sweats, but these are not constant and seem to be stress-related.        Past Medical History:  Diagnosis Date   Anxiety and depression    Arthritis    neck   Diverticulosis of colon without hemorrhage 12/18/2014   ED (erectile dysfunction)    GERD (gastroesophageal reflux disease)    History of adenomatous polyp of colon    History of adenomatous polyp of colon    History of exercise stress test 06/09/2006   ECHO ETT-- NORMAL   History of melanoma excision    nonmalignant and malignant    History of nonmelanoma skin  cancer dermatologist-  dr williford   multiple areas on body,  SCC majority   Hyperlipidemia    Hypertension    IBS (irritable bowel syndrome)    Prolapsed internal hemorrhoids, grade 4     Past Surgical History:  Procedure Laterality Date   COLONOSCOPY  last one 11-10-2014  dr pyrtle   EVALUATION UNDER ANESTHESIA WITH HEMORRHOIDECTOMY N/A 10/17/2017   Procedure: EXAM UNDER ANESTHESIA WITH HEMORRHOIDECTOMY AND HEMORRHOIDAL LIGATION/PEXY TIMES TWO;  Surgeon: Karie Soda, MD;  Location: [facility];  Service: General;  Laterality: N/A;   EXCISION SEBACEOUS CYST  10-18-2013   @[facility]    ON BACK   FLAP CLOSURE LEFT CHEEK POST WLE  09-18-2012    @[facility]    MOHS SURGERY  X2 2010   right ear   WIDE LOCAL EXCISION LEFT CHEEK AND LEFT CERVICAL SENITNAL LYMPH NODE BX  09-07-2012    @[facility]     Family History  Problem Relation Age of Onset   COPD Mother    Emphysema Mother    Stroke Mother        X 2   Heart disease Mother        CABG in her 53s   Hyperlipidemia Mother    Hypertension Mother    Diabetes Mother        type 2   Cancer Father        melanoma   Stroke Father        79  Hypertension Father    Alcohol abuse Father    Heart disease Father    Diabetes Maternal Grandmother        type 2   Heart disease Maternal Grandmother        CHF   Heart attack Maternal Grandfather    Heart disease Paternal Grandmother    Heart disease Paternal Grandfather    Colon cancer Neg Hx    Colon polyps Neg Hx    Esophageal cancer Neg Hx    Rectal cancer Neg Hx    Stomach cancer Neg Hx     Social History   Socioeconomic History   Marital status: Married    Spouse name: Marcelino Duster   Number of children: 2   Years of education: Not on file   Highest education level: 11th grade  Occupational History   Occupation: Landscape  Tobacco Use   Smoking status: Never   Smokeless tobacco: Never  Vaping Use   Vaping status: Never Used  Substance and Sexual Activity   Alcohol  use: Yes    Alcohol/week: 0.0 standard drinks of alcohol    Comment: OCCASIONAL   Drug use: No   Sexual activity: Not on file  Other Topics Concern   Not on file  Social History Narrative   Not on file   Social Determinants of Health   Financial Resource Strain: Medium Risk (12/22/2022)   Overall Financial Resource Strain (CARDIA)    Difficulty of Paying Living Expenses: Somewhat hard  Food Insecurity: No Food Insecurity (12/22/2022)   Hunger Vital Sign    Worried About Running Out of Food in the Last Year: Never true    Ran Out of Food in the Last Year: Never true  Transportation Needs: No Transportation Needs (12/22/2022)   PRAPARE - Administrator, Civil Service (Medical): No    Lack of Transportation (Non-Medical): No  Physical Activity: Inactive (12/22/2022)   Exercise Vital Sign    Days of Exercise per Week: 7 days    Minutes of Exercise per Session: 0 min  Stress: No Stress Concern Present (12/22/2022)   [employer] of Occupational Health - Occupational Stress Questionnaire    Feeling of Stress : Only a little  Social Connections: Socially Isolated (12/22/2022)   Social Connection and Isolation Panel [NHANES]    Frequency of Communication with Friends and Family: Once a week    Frequency of Social Gatherings with Friends and Family: Never    Attends Religious Services: Never    Database administrator or Organizations: No    Attends Engineer, structural: Not on file    Marital Status: Married  Catering manager Violence: Not on file    Outpatient Medications Prior to Visit  Medication Sig Dispense Refill   amLODipine (NORVASC) 5 MG tablet Take 1 tablet by mouth once daily 30 tablet 0   Bempedoic Acid (NEXLETOL) 180 MG TABS Take 180 mg by mouth daily. 30 tablet 11   benzonatate (TESSALON) 100 MG capsule Take 1 capsule (100 mg total) by mouth 3 (three) times daily as needed for cough. 30 capsule 0   cetirizine (EQ ALLERGY RELIEF, CETIRIZINE,) 10 MG  tablet TAKE 1 TABLET BY MOUTH ONCE DAILY AS NEEDED FOR ALLERGIES OR RHINITIS 30 tablet 0   cyclobenzaprine (FLEXERIL) 10 MG tablet Take 0.5-1 tablets (5-10 mg total) by mouth 3 (three) times daily as needed for muscle spasms. 21 tablet 0   EQ ALLERGY RELIEF, CETIRIZINE, 10 MG tablet Take 1 tablet  by mouth once daily 90 tablet 0   ezetimibe (ZETIA) 10 MG tablet Take 1 tablet by mouth once daily 90 tablet 0   famotidine (PEPCID) 40 MG tablet Take 1 tablet by mouth once daily 90 tablet 0   fenofibrate 54 MG tablet TAKE 1 TABLET BY MOUTH ONCE DAILY WITH SUPPER 90 tablet 2   FLUoxetine (PROZAC) 20 MG capsule Take 1 capsule (20 mg total) by mouth daily. 30 capsule 0   fluticasone (FLONASE) 50 MCG/ACT nasal spray Place 2 sprays into both nostrils daily as needed. 16 g 3   meloxicam (MOBIC) 15 MG tablet Take 1 tablet by mouth once daily 90 tablet 0   buPROPion (WELLBUTRIN XL) 300 MG 24 hr tablet Take 1 tablet (300 mg total) by mouth daily. 30 tablet 0   No facility-administered medications prior to visit.    Allergies  Allergen Reactions   Crestor [Rosuvastatin] Other (See Comments)    myalgias    Review of Systems  Constitutional:  Positive for malaise/fatigue. Negative for fever.  HENT:  Negative for congestion.   Eyes:  Negative for blurred vision.  Respiratory:  Negative for shortness of breath.   Cardiovascular:  Negative for chest pain, palpitations and leg swelling.  Gastrointestinal:  Negative for abdominal pain, blood in stool and nausea.  Genitourinary:  Negative for dysuria and frequency.  Musculoskeletal:  Positive for joint pain and myalgias. Negative for falls.  Skin:  Negative for rash.  Neurological:  Negative for dizziness, loss of consciousness and headaches.  Endo/Heme/Allergies:  Negative for environmental allergies.  Psychiatric/Behavioral:  Positive for depression. Negative for hallucinations and suicidal ideas. The patient is nervous/anxious. The patient does not have  insomnia.        Objective:    Physical Exam Vitals reviewed.  Constitutional:      Appearance: Normal appearance. He is not ill-appearing.  HENT:     Head: Normocephalic and atraumatic.     Nose: Nose normal.  Eyes:     Conjunctiva/sclera: Conjunctivae normal.  Cardiovascular:     Rate and Rhythm: Normal rate.     Pulses: Normal pulses.     Heart sounds: Normal heart sounds. No murmur heard. Pulmonary:     Effort: Pulmonary effort is normal.     Breath sounds: Normal breath sounds. No wheezing.  Abdominal:     Palpations: Abdomen is soft. There is no mass.     Tenderness: There is no abdominal tenderness.  Musculoskeletal:     Cervical back: Normal range of motion.     Right lower leg: No edema.     Left lower leg: No edema.  Skin:    General: Skin is warm and dry.  Neurological:     General: No focal deficit present.     Mental Status: He is alert and oriented to person, place, and time.  Psychiatric:        Mood and Affect: Mood normal.     Comments: tearful     BP (!) 142/82 (BP Location: Right Arm, Patient Position: Sitting, Cuff Size: Normal)   Pulse 98   Temp 98 F (36.7 C) (Oral)   Resp 16   Ht 5\' 9"  (1.753 m)   Wt 155 lb 9.6 oz (70.6 kg)   SpO2 98%   BMI 22.98 kg/m  Wt Readings from Last 3 Encounters:  12/23/22 155 lb 9.6 oz (70.6 kg)  06/11/21 165 lb 12.8 oz (75.2 kg)  06/08/20 170 lb (77.1 kg)    Diabetic  Foot Exam - Simple   No data filed    Lab Results  Component Value Date   WBC 10.5 06/08/2020   HGB 14.6 06/08/2020   HCT 42.7 06/08/2020   PLT 303.0 06/08/2020   GLUCOSE 94 05/04/2021   CHOL 279 (H) 04/25/2021   TRIG 274.0 (H) 04/25/2021   HDL 41.10 04/25/2021   LDLDIRECT 173.0 04/25/2021   LDLCALC 182 (H) 04/07/2018   ALT 17 05/04/2021   AST 15 05/04/2021   NA 139 05/04/2021   K 4.6 05/04/2021   CL 103 05/04/2021   CREATININE 1.01 05/04/2021   BUN 19 05/04/2021   CO2 33 (H) 05/04/2021   TSH 2.63 06/08/2020   PSA 0.54  06/10/2019   HGBA1C 5.6 06/10/2019    Lab Results  Component Value Date   TSH 2.63 06/08/2020   Lab Results  Component Value Date   WBC 10.5 06/08/2020   HGB 14.6 06/08/2020   HCT 42.7 06/08/2020   MCV 93.2 06/08/2020   PLT 303.0 06/08/2020   Lab Results  Component Value Date   NA 139 05/04/2021   K 4.6 05/04/2021   CO2 33 (H) 05/04/2021   GLUCOSE 94 05/04/2021   BUN 19 05/04/2021   CREATININE 1.01 05/04/2021   BILITOT 0.6 05/04/2021   ALKPHOS 78 05/04/2021   AST 15 05/04/2021   ALT 17 05/04/2021   PROT 6.7 05/04/2021   ALBUMIN 4.4 05/04/2021   CALCIUM 9.4 05/04/2021   GFR 83.27 05/04/2021   Lab Results  Component Value Date   CHOL 279 (H) 04/25/2021   Lab Results  Component Value Date   HDL 41.10 04/25/2021   Lab Results  Component Value Date   LDLCALC 182 (H) 04/07/2018   Lab Results  Component Value Date   TRIG 274.0 (H) 04/25/2021   Lab Results  Component Value Date   CHOLHDL 7 04/25/2021   Lab Results  Component Value Date   HGBA1C 5.6 06/10/2019       Assessment & Plan:  Skin cancer Assessment & Plan: Recurrent and follows closely with dermatology   Myalgia Assessment & Plan: Hydrate and monitor   Orders: -     Magnesium  Mixed hyperlipidemia Assessment & Plan: Encourage heart healthy diet such as MIND or DASH diet, increase exercise, avoid trans fats, simple carbohydrates and processed foods, consider a krill or fish or flaxseed oil cap daily.    Orders: -     Lipid panel  Statin myopathy Assessment & Plan: Myalgias on several statins   Essential hypertension Assessment & Plan: Well controlled, no changes to meds. Encouraged heart healthy diet such as the DASH diet and exercise as tolerated.    Orders: -     CBC with Differential/Platelet -     Comprehensive metabolic panel -     TSH  Heterozygous familial hypercholesterolemia Assessment & Plan: Encourage heart healthy diet such as MIND or DASH diet, increase  exercise, avoid trans fats, simple carbohydrates and processed foods, consider a krill or fish or flaxseed oil cap daily.     Toe pain, right Assessment & Plan: Hydrate and monitor and check uric acid   Bunion of great toe of right foot  Arthralgia, unspecified joint Assessment & Plan: Try topical rubs and stay busy check labs   Orders: -     ANA -     Rheumatoid factor  Insomnia, unspecified type Assessment & Plan: Encouraged good sleep hygiene such as dark, quiet room. No blue/green glowing lights such as  computer screens in bedroom. No alcohol or stimulants in evening. Cut down on caffeine as able. Regular exercise is helpful but not just prior to bed time.     Anxiety and depression Assessment & Plan: Severe grief reaction after losing a couple of close family members. He has been out    Chronic right shoulder pain Assessment & Plan: Had a rotator cuff repair in past and it is acting up again, is considering replacement will let us know when ready for referral to [facility] ortho   DOE (dyspnea on exertion) -     Ambulatory referral to Pulmonology  Other orders -     buPROPion HCl ER (XL); Take 1 tablet (300 mg total) by mouth daily.  Dispense: 30 tablet; Refill: 5 -     hydrOXYzine HCl; Take 1 tablet (10 mg total) by mouth 2 (two) times daily as needed.  Dispense: 40 tablet; Refill: 1    Assessment and Plan    Arthralgia Persistent joint pain in fingers and right foot. Possible osteoarthritis given morning stiffness that improves with movement. No signs of inflammatory arthritis  -Order rheumatoid factor and ANA for baseline. -Advise patient to stay active and consider over-the-counter NSAIDs as needed for pain.  Bunion of right foot Painful bunion on right foot, possibly contributing to gout. -Check uric acid level. -Advise patient to wear wide shoes to reduce pressure on the bunion. -Consider use of corn pads, lidocaine gel, or Voltaren gel for pain  relief.  Insomnia Difficulty falling asleep and staying asleep, possibly related to depression and anxiety. -Restart Bupropion. -Consider trial of low-dose Hydroxyzine for sleep. -Consider counseling or psychiatric referral for ongoing management of depression and anxiety. patient declines for now  Shortness of breath Shortness of breath with exertion, possibly related to chronic obstructive pulmonary disease (COPD) due to smoking history. -Refer to pulmonologist for further evaluation. -Advise patient to monitor symptoms and report if they worsen.  Hypertension Elevated blood pressure noted during visit. -Recheck blood pressure. -Consider adding low-dose Metoprolol if blood pressure remains high.  General Health Maintenance / Followup Plans -Advise patient to prioritize protein and water intake, even with decreased appetite. -Consider flu shot, COVID shot, and shingles vaccine. -Order blood work. -Follow up in 3 months to assess medication effectiveness and ongoing issues. -If shortness of breath worsens, patient to notify office.         Danise Edge, MD

## 2022-12-23 NOTE — Assessment & Plan Note (Signed)
Try topical rubs and stay busy check labs

## 2022-12-23 NOTE — Assessment & Plan Note (Signed)
Hydrate and monitor and check uric acid

## 2022-12-25 ENCOUNTER — Encounter: Payer: Self-pay | Admitting: Family Medicine

## 2022-12-25 LAB — ANTI-NUCLEAR AB-TITER (ANA TITER)
ANA TITER: 1:40 {titer} — ABNORMAL HIGH
ANA Titer 1: 1:40 {titer} — ABNORMAL HIGH

## 2022-12-25 LAB — RHEUMATOID FACTOR: Rheumatoid fact SerPl-aCnc: <10 [IU]/mL (ref <14)

## 2022-12-25 LAB — ANA: Anti Nuclear Antibody (ANA): POSITIVE — AB

## 2022-12-25 MED ORDER — BUPROPION HCL ER (XL) 300 MG PO TB24: 300.0000 mg | ORAL_TABLET | Freq: Every day | ORAL | 5 refills | Status: DC

## 2022-12-27 ENCOUNTER — Other Ambulatory Visit: Payer: Self-pay

## 2022-12-27 DIAGNOSIS — R768 Other specified abnormal immunological findings in serum: Secondary | ICD-10-CM

## 2022-12-27 NOTE — Telephone Encounter (Signed)
Referral sent 

## 2022-12-28 ENCOUNTER — Other Ambulatory Visit: Payer: Self-pay | Admitting: Family Medicine

## 2022-12-28 DIAGNOSIS — F418 Other specified anxiety disorders: Secondary | ICD-10-CM

## 2022-12-28 DIAGNOSIS — E785 Hyperlipidemia, unspecified: Secondary | ICD-10-CM

## 2022-12-30 MED ORDER — FLUOXETINE HCL 20 MG PO CAPS: 20.0000 mg | ORAL_CAPSULE | Freq: Every day | ORAL | 1 refills | Status: DC

## 2023-01-03 ENCOUNTER — Other Ambulatory Visit: Payer: Self-pay | Admitting: Medical

## 2023-01-03 ENCOUNTER — Other Ambulatory Visit: Payer: Self-pay | Admitting: Family Medicine

## 2023-01-05 ENCOUNTER — Other Ambulatory Visit: Payer: Self-pay | Admitting: Medical

## 2023-01-19 ENCOUNTER — Other Ambulatory Visit: Payer: Self-pay | Admitting: Family Medicine

## 2023-01-20 ENCOUNTER — Ambulatory Visit (INDEPENDENT_AMBULATORY_CARE_PROVIDER_SITE_OTHER): Payer: Medicare Other | Admitting: Podiatry

## 2023-01-20 ENCOUNTER — Encounter: Payer: Self-pay | Admitting: Podiatry

## 2023-01-20 ENCOUNTER — Ambulatory Visit (INDEPENDENT_AMBULATORY_CARE_PROVIDER_SITE_OTHER): Payer: Medicare Other

## 2023-01-20 DIAGNOSIS — M778 Other enthesopathies, not elsewhere classified: Secondary | ICD-10-CM

## 2023-01-20 DIAGNOSIS — M7751 Other enthesopathy of right foot: Secondary | ICD-10-CM

## 2023-01-20 DIAGNOSIS — M205X1 Other deformities of toe(s) (acquired), right foot: Secondary | ICD-10-CM

## 2023-01-20 MED ORDER — TRIAMCINOLONE ACETONIDE 10 MG/ML IJ SUSP
10.0000 mg | Freq: Once | INTRAMUSCULAR | Status: AC
  Administered 2023-01-20: 10 mg via INTRA_ARTICULAR

## 2023-01-20 NOTE — Progress Notes (Signed)
Subjective:   Patient ID: Aaron Dyer, male   DOB: 57 y.o.   MRN: [id number]   HPI Patient states he has had a lot of pain in his big toe joint right that is been going on consistently for a long time worse over the last 6 months.  States it also makes him walk differently and it gets some pain on the inside of his foot and into his ankle and patient does not smoke tries to be active   Review of Systems  All other systems reviewed and are negative.       Objective:  Physical Exam Vitals and nursing note reviewed.  Constitutional:      Appearance: He is well-developed.  Pulmonary:     Effort: Pulmonary effort is normal.  Musculoskeletal:        General: Normal range of motion.  Skin:    General: Skin is warm.  Neurological:     Mental Status: He is alert.     Neurovascular status intact muscle strength found to be adequate range of motion adequate with patient found to have inflammation and pain around the first MPJ right with fluid buildup and painful when pressed.  Patient has limited motion and no crepitus within the joint noted.  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  I did do periarticular injection around the joint 3 mg dexamethasone Kenalog 5 mg Xylocaine and advised on rigid bottom shoes.  Reappoint and surgery tentatively scheduled for December  X-rays indicate bone spur formation around the first metatarsal head mild narrowing of the joint surface indications of mild arthritic condition

## 2023-01-28 ENCOUNTER — Other Ambulatory Visit: Payer: Self-pay | Admitting: Family Medicine

## 2023-01-28 ENCOUNTER — Ambulatory Visit: Payer: Medicare Other | Admitting: Family Medicine

## 2023-02-17 ENCOUNTER — Encounter: Payer: Self-pay | Admitting: Podiatry

## 2023-02-17 ENCOUNTER — Ambulatory Visit (INDEPENDENT_AMBULATORY_CARE_PROVIDER_SITE_OTHER): Payer: Medicare Other | Admitting: Podiatry

## 2023-02-17 DIAGNOSIS — M205X1 Other deformities of toe(s) (acquired), right foot: Secondary | ICD-10-CM | POA: Diagnosis not present

## 2023-02-17 NOTE — Progress Notes (Signed)
Subjective:   Patient ID: Aaron Dyer, male   DOB: 57 y.o.   MRN: [id number]   HPI Patient states she is ready to have surgery on this right big toe joint but the medicine did help him   ROS      Objective:  Physical Exam  Neurovascular status intact with patient found to have multiple indications of arthritis of the big toe joint right with pain in the joint surface no other pathology noted.  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  I have recommended a biplanar osteotomy with removal of bone spurs he wants this done and I allowed him to read the consent form going over alternative treatments complications associated with surgery.  I did go ahead today I dispensed air fracture walker as I want him to wear that during postop period but I like him to get used to it before surgery and find a shoe comfortable on his other foot.  I did explain arthritis and we cannot reverse it and there is no guarantee there will not be some arthritis of the joint and that there eventually may not be a fusion or implant necessary

## 2023-02-23 ENCOUNTER — Other Ambulatory Visit: Payer: Self-pay | Admitting: Family Medicine

## 2023-02-23 ENCOUNTER — Other Ambulatory Visit: Payer: Self-pay | Admitting: Medical

## 2023-02-24 DIAGNOSIS — K5900 Constipation, unspecified: Secondary | ICD-10-CM | POA: Diagnosis not present

## 2023-02-24 DIAGNOSIS — L0231 Cutaneous abscess of buttock: Secondary | ICD-10-CM | POA: Diagnosis not present

## 2023-02-24 MED ORDER — OXYCODONE-ACETAMINOPHEN 10-325 MG PO TABS: 1.0000 | ORAL_TABLET | Freq: Three times a day (TID) | ORAL | 0 refills | Status: DC | PRN

## 2023-02-24 MED ORDER — ONDANSETRON HCL 4 MG PO TABS: 4.0000 mg | ORAL_TABLET | Freq: Three times a day (TID) | ORAL | 0 refills | Status: DC | PRN

## 2023-02-24 NOTE — Addendum Note (Signed)
Addended by: Lenn Sink on: 02/24/2023 05:00 PM   Modules accepted: Orders

## 2023-02-25 DIAGNOSIS — M205X1 Other deformities of toe(s) (acquired), right foot: Secondary | ICD-10-CM | POA: Diagnosis not present

## 2023-02-25 DIAGNOSIS — M2021 Hallux rigidus, right foot: Secondary | ICD-10-CM | POA: Diagnosis not present

## 2023-02-25 DIAGNOSIS — M778 Other enthesopathies, not elsewhere classified: Secondary | ICD-10-CM | POA: Diagnosis not present

## 2023-02-25 HISTORY — PX: OTHER SURGICAL HISTORY: SHX169

## 2023-02-27 NOTE — Progress Notes (Signed)
03/03/2023 Aaron Dyer [id number] 19-Jul-1965  Referring provider: Bradd Canary, MD Primary GI doctor: Dr. Rhea Belton  ASSESSMENT AND PLAN:   Perianal Abscess New onset perianal mass, likely infected hair follicle. No systemic symptoms. Decreased in size with warm compresses. No fluctuance on examination. No evidence of fistula or perianal issues with anoscopy, no history of IBD. -Continue warm compresses. -Prescribe Doxycycline to be taken if symptoms worsen. Follow-up with PCP  Hemorrhoids History of hemorrhoidectomy and banding. Current asymptomatic internal hemorrhoids noted on examination. -No intervention required at this time. - work on improving toileting techniques  Constipation Infrequent constipation, likely related to inadequate hydration. -Encouraged increased water intake. -Recommended daily Miralax in coffee.    Patient Care Team: Bradd Canary, MD as PCP - General (Family Medicine) Jodelle Red, MD as PCP - Cardiology (Cardiology) Karie Soda, MD as Consulting Physician (General Surgery) Pyrtle, Carie Caddy, MD as Consulting Physician (Gastroenterology)  HISTORY OF PRESENT ILLNESS: 57 y.o. male with a past medical history of hemorrhoids, hemorrhoidectomy, depression, GERD, history of melanoma, and others listed below presents for evaluation of hemorrhoids.   2022 colonoscopy 3 mm polyp transverse colon, 4 mm polyp descending colon, diverticula sigmoid and ascending colon 02/24/2023 office visit with [facility] digestive health services was initial consultation-did have small abscess left buttocks no evidence of fistula trial of antibiotics and follow-up with PCP  Discussed the use of AI scribe software for clinical note transcription with the patient, who gave verbal consent to proceed.  The patient, with a history of colon polyps, diverticula, and hemorrhoids, presents with a new perianal lesion. He reports the lesion started about two weeks  ago and initially was larger but has since decreased in size. The lesion was described as a boil or cyst located adjacent to the anus. The patient denies any associated fever or chills. He has attempted to manipulate the lesion during cleaning but has otherwise left it alone. He was previously prescribed Bactrim for the lesion but discontinued it due to nausea.  The patient also reports a history of hemorrhoids, for which he underwent banding and a hemorrhoidectomy approximately four years ago. He currently has some hemorrhoids but denies any associated discomfort.  Regarding bowel habits, the patient reports daily bowel movements with occasional constipation, which he attributes to inadequate water intake. He denies any rectal pain, dark or bloody stools. He admits to straining during bowel movements, particularly after alcohol consumption. He has not tried any treatments for constipation in the past.  The patient has a history of melanoma, which contributes to his concern about the new perianal lesion.  He  reports that he has never smoked. He has never used smokeless tobacco. He reports current alcohol use. He reports that he does not use drugs.  RELEVANT LABS AND IMAGING:  Results   DIAGNOSTIC Colonoscopy: Polyps, diverticula (2022)      CBC    Component Value Date/Time   WBC 9.3 12/23/2022 1031   RBC 5.01 12/23/2022 1031   HGB 15.6 12/23/2022 1031   HCT 46.8 12/23/2022 1031   PLT 289.0 12/23/2022 1031   MCV 93.5 12/23/2022 1031   MCH 31.5 04/07/2018 1829   MCHC 33.2 12/23/2022 1031   RDW 14.0 12/23/2022 1031   LYMPHSABS 2.7 12/23/2022 1031   MONOABS 0.4 12/23/2022 1031   EOSABS 0.1 12/23/2022 1031   BASOSABS 0.1 12/23/2022 1031   Recent Labs    12/23/22 1031  HGB 15.6    CMP  Component Value Date/Time   NA 138 12/23/2022 1031   K 5.1 12/23/2022 1031   CL 103 12/23/2022 1031   CO2 29 12/23/2022 1031   GLUCOSE 98 12/23/2022 1031   BUN 16 12/23/2022 1031    CREATININE 0.89 12/23/2022 1031   CREATININE 1.21 08/19/2013 1703   CALCIUM 9.9 12/23/2022 1031   PROT 7.2 12/23/2022 1031   ALBUMIN 4.8 12/23/2022 1031   AST 15 12/23/2022 1031   ALT 17 12/23/2022 1031   ALKPHOS 82 12/23/2022 1031   BILITOT 0.5 12/23/2022 1031      Latest Ref Rng & Units 12/23/2022   10:31 AM 05/04/2021    8:04 AM 04/25/2021    2:55 PM  Hepatic Function  Total Protein 6.0 - 8.3 g/dL 7.2  6.7  7.5   Albumin 3.5 - 5.2 g/dL 4.8  4.4  5.0   AST 0 - 37 U/L 15  15  16    ALT 0 - 53 U/L 17  17  18    Alk Phosphatase 39 - 117 U/L 82  78  87   Total Bilirubin 0.2 - 1.2 mg/dL 0.5  0.6  0.5       Current Medications:    Current Outpatient Medications (Cardiovascular):    amLODipine (NORVASC) 5 MG tablet, Take 1 tablet by mouth once daily   ezetimibe (ZETIA) 10 MG tablet, Take 1 tablet by mouth once daily   fenofibrate 54 MG tablet, TAKE 1 TABLET BY MOUTH ONCE DAILY WITH SUPPER  Current Outpatient Medications (Respiratory):    EQ ALLERGY RELIEF, CETIRIZINE, 10 MG tablet, Take 1 tablet by mouth once daily  Current Outpatient Medications (Analgesics):    meloxicam (MOBIC) 15 MG tablet, Take 1 tablet by mouth once daily   oxyCODONE-acetaminophen (PERCOCET) 10-325 MG tablet, Take 1 tablet by mouth every 8 (eight) hours as needed for pain. (Patient not taking: Reported on 03/03/2023)   Current Outpatient Medications (Other):    buPROPion (WELLBUTRIN XL) 300 MG 24 hr tablet, Take 1 tablet (300 mg total) by mouth daily.   doxycycline (VIBRA-TABS) 100 MG tablet, Take 1 tablet (100 mg total) by mouth 2 (two) times daily.   famotidine (PEPCID) 40 MG tablet, Take 1 tablet by mouth once daily   FLUoxetine (PROZAC) 20 MG capsule, Take 1 capsule (20 mg total) by mouth daily.   hydrOXYzine (ATARAX) 10 MG tablet, Take 1 tablet (10 mg total) by mouth 2 (two) times daily as needed.  Medical History:  Past Medical History:  Diagnosis Date   Anxiety and depression    Arthritis    neck    Diverticulosis of colon without hemorrhage 12/18/2014   ED (erectile dysfunction)    GERD (gastroesophageal reflux disease)    Hemorrhoids    History of adenomatous polyp of colon    History of adenomatous polyp of colon    History of exercise stress test 06/09/2006   ECHO ETT-- NORMAL   History of melanoma excision    nonmalignant and malignant    History of nonmelanoma skin cancer dermatologist-  dr williford   multiple areas on body,  SCC majority   Hyperlipidemia    Hypertension    IBS (irritable bowel syndrome)    Prolapsed internal hemorrhoids, grade 4    Allergies:  Allergies  Allergen Reactions   Crestor [Rosuvastatin] Other (See Comments)    myalgias     Surgical History:  He  has a past surgical history that includes Mohs surgery (X2 2010); FLAP CLOSURE LEFT CHEEK  POST WLE (09-18-2012    @[facility] ); EXCISION SEBACEOUS CYST (10-18-2013   @[facility] ); WIDE LOCAL EXCISION LEFT CHEEK AND LEFT CERVICAL SENITNAL LYMPH NODE BX (09-07-2012    @[facility] ); Exam under anesthesia with hemorrhoidectomy (N/A, 10/17/2017); Colonoscopy (last one 11-10-2014  dr pyrtle); and Right Foot Surgery (02/25/2023). Family History:  His family history includes Alcohol abuse in his father; COPD in his mother; Cancer in his father; Diabetes in his maternal grandmother and mother; Emphysema in his mother; Heart attack in his maternal grandfather; Heart disease in his father, maternal grandmother, mother, paternal grandfather, and paternal grandmother; Hyperlipidemia in his mother; Hypertension in his father and mother; Stroke in his father and mother.  REVIEW OF SYSTEMS  : All other systems reviewed and negative except where noted in the History of Present Illness.  PHYSICAL EXAM: BP 122/84   Pulse 75   Ht 5\' 9"  (1.753 m)   Wt 158 lb 2 oz (71.7 kg)   SpO2 98%   BMI 23.35 kg/m  General Appearance: Well nourished, in no apparent distress. Head:   Normocephalic and atraumatic. Eyes:  sclerae  anicteric,conjunctive pink  Respiratory: Respiratory effort normal, BS equal bilaterally without rales, rhonchi, wheezing. Cardio: RRR with no MRGs. Peripheral pulses intact.  Abdomen: Soft,  Non-distended ,active bowel sounds. No tenderness . No masses. Rectal:  External exam: Left buttocks approximately 1-1/2 to 2 inches from the rectum there is a 10 mm erythematous pustule without significant flatulence underneath indurated area extends slightly towards the rectum is approximately 1.5-2 centimeters Rectal tone: normal rectal tone Internal exam: non-tender, no masses, brown stool, hemoccult Negative Prostate smooth, no nodules Anoscopy was performed after rectal exam with the patient in the left lateral decubitus position and revealed Right anterior and posterior hemorrhoids, no fissures, nodules pus or fistula seen. Musculoskeletal: Full ROM, Antalgic gait with right foot in a boot. Without edema. Skin:  Dry and intact without significant lesions or rashes Neuro: Alert and  oriented x4;  No focal deficits. Psych:  Cooperative. Normal mood and affect.    Doree Albee, PA-C 12:15 PM

## 2023-03-03 ENCOUNTER — Ambulatory Visit (INDEPENDENT_AMBULATORY_CARE_PROVIDER_SITE_OTHER): Payer: Medicare Other | Admitting: Podiatry

## 2023-03-03 ENCOUNTER — Ambulatory Visit (INDEPENDENT_AMBULATORY_CARE_PROVIDER_SITE_OTHER): Payer: Medicare Other | Admitting: Physician Assistant

## 2023-03-03 ENCOUNTER — Encounter: Payer: Self-pay | Admitting: Physician Assistant

## 2023-03-03 ENCOUNTER — Ambulatory Visit (INDEPENDENT_AMBULATORY_CARE_PROVIDER_SITE_OTHER): Payer: Medicare Other

## 2023-03-03 VITALS — BP 122/84 | HR 75 | Ht 69.0 in | Wt 158.1 lb

## 2023-03-03 DIAGNOSIS — L538 Other specified erythematous conditions: Secondary | ICD-10-CM | POA: Diagnosis not present

## 2023-03-03 DIAGNOSIS — M2021 Hallux rigidus, right foot: Secondary | ICD-10-CM

## 2023-03-03 DIAGNOSIS — K648 Other hemorrhoids: Secondary | ICD-10-CM

## 2023-03-03 DIAGNOSIS — Z9889 Other specified postprocedural states: Secondary | ICD-10-CM | POA: Diagnosis not present

## 2023-03-03 DIAGNOSIS — K5900 Constipation, unspecified: Secondary | ICD-10-CM

## 2023-03-03 DIAGNOSIS — K611 Rectal abscess: Secondary | ICD-10-CM | POA: Diagnosis not present

## 2023-03-03 DIAGNOSIS — K635 Polyp of colon: Secondary | ICD-10-CM

## 2023-03-03 DIAGNOSIS — L0232 Furuncle of buttock: Secondary | ICD-10-CM

## 2023-03-03 DIAGNOSIS — K5730 Diverticulosis of large intestine without perforation or abscess without bleeding: Secondary | ICD-10-CM

## 2023-03-03 DIAGNOSIS — I2510 Atherosclerotic heart disease of native coronary artery without angina pectoris: Secondary | ICD-10-CM

## 2023-03-03 DIAGNOSIS — K642 Third degree hemorrhoids: Secondary | ICD-10-CM

## 2023-03-03 MED ORDER — DOXYCYCLINE HYCLATE 100 MG PO TABS: 100.0000 mg | ORAL_TABLET | Freq: Two times a day (BID) | ORAL | 0 refills | Status: DC

## 2023-03-03 NOTE — Patient Instructions (Addendum)
Skin Abscess  A skin abscess is an infected area on or under your skin. It contains pus and other material. An abscess may also be called a furuncle, carbuncle, or boil. It is often the result of an infection caused by bacteria. An abscess can occur in or on almost any part of your body. Sometimes, an abscess may break open (rupture) on its own. In most cases, it will keep getting worse unless it is treated. An abscess can cause pain and make you feel ill. An untreated abscess can cause infection to spread to other parts of your body or your bloodstream. The abscess may need to be drained. You may also need to take antibiotics. What are the causes? An abscess occurs when germs, like bacteria, pass through your skin and cause an infection. This may be caused by: A scrape or cut on your skin. A puncture wound through your skin, such as a needle injection or insect bite. Blocked oil or sweat glands. Blocked and infected hair follicles. A fluid-filled sac that forms beneath your skin (sebaceous cyst) and becomes infected. What increases the risk? You may be more likely to develop an abscess if: You have problems with blood circulation, or you have a weak body defense system (immune system). You have diabetes. You have dry and irritated skin. You get injections often or use IV drugs. You have a foreign body in a wound, such as a splinter. You smoke or use tobacco products. What are the signs or symptoms? Symptoms of this condition include: A painful, firm bump under the skin. A bump with pus at the top. This may break through the skin and drain. Other symptoms include: Redness and swelling around the abscess. Warmth or tenderness. Swelling of the lymph nodes (glands) near the abscess. A sore on the skin. How is this diagnosed? This condition may be diagnosed based on a physical exam and your medical history. You may also have tests done, such as: A test of a sample of pus. This may be  done to find what is causing the infection. Blood tests. Imaging tests, such as an ultrasound, CT scan, or MRI. How is this treated? A small abscess that drains on its own may not need to be treated. Treatment for larger abscesses may include: Moist heat or a heat pack applied to the area a few times a day. Incision and drainage. This is a procedure to drain the abscess. Antibiotics. For a severe abscess, you may first get antibiotics through an IV and then change to antibiotics by mouth. Follow these instructions at home: Medicines Take over-the-counter and prescription medicines only as told by your provider. If you were prescribed antibiotics, take them as told by your provider. Do not stop using the antibiotic even if you start to feel better. Abscess care  If you have an abscess that has not drained, apply heat to the affected area. Use the heat source that your provider recommends, such as a moist heat pack or a heating pad. Place a towel between your skin and the heat source. Leave the heat on for 20-30 minutes at a time. If your skin turns bright red, remove the heat right away to prevent burns. The risk of burns is higher if you cannot feel pain, heat, or cold. Follow instructions from your provider about how to take care of your abscess. Make sure you: Cover the abscess with a bandage (dressing). Wash your hands with soap and water for at least 20 seconds  before and after you change the dressing or gauze. If soap and water are not available, use hand sanitizer. Change your dressing or gauze as told by your provider. Check your abscess every day for signs of an infection that is getting worse. Check for: More redness, swelling, pain, or tenderness. More fluid or blood. Warmth. More pus or a worse smell. General instructions To avoid spreading the infection: Do not share personal care items, towels, or hot tubs with others. Avoid making skin contact with other people. Be  careful when getting rid of used dressings, wound packing, or any drainage from the abscess. Do not use any products that contain nicotine or tobacco. These products include cigarettes, chewing tobacco, and vaping devices, such as e-cigarettes. If you need help quitting, ask your provider. Do not use any creams, ointments, or liquids unless you have been told to by your provider. Contact a health care provider if: You see redness that spreads quickly or red streaks on your skin spreading away from the abscess. You have any signs of worse infection at the abscess. You vomit every time you eat or drink. You have a fever, chills, or muscle aches. The cyst or abscess returns. Get help right away if: You have severe pain. You make less pee (urine) than normal. This information is not intended to replace advice given to you by your health care provider. Make sure you discuss any questions you have with your health care provider. Document Revised: 10/24/2021 Document Reviewed: 10/24/2021 Elsevier Patient Education  2024 [employer]   Miralax is an osmotic laxative.  It only brings more water into the stool.  This is safe to take daily.  Can take up to 17 gram of miralax twice a day.  Mix with juice or coffee.  Start 1 capful at night for 3-4 days and reassess your response in 3-4 days.  You can increase and decrease the dose based on your response.  Remember, it can take up to 3-4 days to take effect OR for the effects to wear off.   I often pair this with benefiber in the morning to help assure the stool is not too loose.   Toileting tips to help with your constipation - Drink at least 64-80 ounces of water/liquid per day. - Establish a time to try to move your bowels every day.  For many people, this is after a cup of coffee or after a meal such as breakfast. - Sit all of the way back on the toilet keeping your back fairly straight and while sitting up, try to rest the tops of your  forearms on your upper thighs.   - Raising your feet with a step stool/squatty potty can be helpful to improve the angle that allows your stool to pass through the rectum. - Relax the rectum feeling it bulge toward the toilet water.  If you feel your rectum raising toward your body, you are contracting rather than relaxing. - Breathe in and slowly exhale. "Belly breath" by expanding your belly towards your belly button. Keep belly expanded as you gently direct pressure down and back to the anus.  A low pitched GRRR sound can assist with increasing intra-abdominal pressure.  (Can also trying to blow on a pinwheel and make it move, this helps with the same belly breathing) - Repeat 3-4 times. If unsuccessful, contract the pelvic floor to restore normal tone and get off the toilet.  Avoid excessive straining. - To reduce excessive wiping by teaching your  anus to normally contract, place hands on outer aspect of knees and resist knee movement outward.  Hold 5-10 second then place hands just inside of knees and resist inward movement of knees.  Hold 5 seconds.  Repeat a few times each way.  _______________________________________________________  If your blood pressure at your visit was 140/90 or greater, please contact your primary care physician to follow up on this.  _______________________________________________________  If you are age 22 or older, your body mass index should be between 23-30. Your Body mass index is 23.35 kg/m. If this is out of the aforementioned range listed, please consider follow up with your Primary Care Provider.  If you are age 50 or younger, your body mass index should be between 19-25. Your Body mass index is 23.35 kg/m. If this is out of the aformentioned range listed, please consider follow up with your Primary Care Provider.   ________________________________________________________  The [facility] providers would like to encourage you to use [facility] to communicate  with providers for non-urgent requests or questions.  Due to long hold times on the telephone, sending your provider a message by [facility] may be a faster and more efficient way to get a response.  Please allow 48 business hours for a response.  Please remember that this is for non-urgent requests.  _______________________________________________________ It was a pleasure to see you today!  Thank you for trusting me with your gastrointestinal care!     Go to the ER if unable to pass gas, severe AB pain, unable to hold down food, any shortness of breath of chest pain.

## 2023-03-05 NOTE — Progress Notes (Signed)
Subjective:   Patient ID: Aaron Dyer, male   DOB: 57 y.o.   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

## 2023-03-07 ENCOUNTER — Other Ambulatory Visit: Payer: Self-pay | Admitting: Family Medicine

## 2023-03-09 NOTE — Assessment & Plan Note (Signed)
Encourage heart healthy diet such as MIND or DASH diet, increase exercise, avoid trans fats, simple carbohydrates and processed foods, consider a krill or fish or flaxseed oil cap daily.  °

## 2023-03-09 NOTE — Assessment & Plan Note (Signed)
Monitor and report any concerns, no changes to meds. Encouraged heart healthy diet such as the DASH diet and exercise as tolerated.  ?

## 2023-03-10 ENCOUNTER — Telehealth: Payer: Medicare Other | Admitting: Family Medicine

## 2023-03-10 ENCOUNTER — Encounter: Payer: Self-pay | Admitting: Family Medicine

## 2023-03-10 VITALS — BP 137/92 | HR 86

## 2023-03-10 DIAGNOSIS — E7801 Familial hypercholesterolemia: Secondary | ICD-10-CM

## 2023-03-10 DIAGNOSIS — I10 Essential (primary) hypertension: Secondary | ICD-10-CM | POA: Diagnosis not present

## 2023-03-10 NOTE — Progress Notes (Signed)
MyChart Video Visit    Virtual Visit via Video Note   This patient is at least at moderate risk for complications without adequate follow up. This format is felt to be most appropriate for this patient at this time. Physical exam was limited by quality of the video and audio technology used for the visit. Juanetta, CMA was able to get the patient set up on a video visit.  Patient location: Home Patient and provider in visit Provider location: Office  I discussed the limitations of evaluation and management by telemedicine and the availability of in person appointments. The patient expressed understanding and agreed to proceed.  Visit Date: 03/10/2023  Today's healthcare provider: Danise Edge, MD     Subjective:    Patient ID: Aaron Dyer, male    DOB: 1966-01-15, 57 y.o.   MRN: [id number]  Chief Complaint  Patient presents with   Follow-up    3 month    HPI Discussed the use of AI scribe software for clinical note transcription with the patient, who gave verbal consent to proceed.  History of Present Illness   The patient, with a history of recent right foot surgery and hemorrhoids, presents for a routine follow-up. They report no new hospitalizations or complications from the recent surgery. The surgical site is healing well with no signs of infection. They deny any new trouble with hemorrhoids.  Previously, the patient had been experiencing significant pain, which was investigated with blood work. The ANA was positive, suggesting a potential autoimmune condition. However, the patient reports that the pain is currently manageable and has declined a referral to rheumatology.  The patient also reports no need for medication refills at this time.        Past Medical History:  Diagnosis Date   Anxiety and depression    Arthritis    neck   Diverticulosis of colon without hemorrhage 12/18/2014   ED (erectile dysfunction)    GERD (gastroesophageal reflux disease)     Hemorrhoids    History of adenomatous polyp of colon    History of adenomatous polyp of colon    History of exercise stress test 06/09/2006   ECHO ETT-- NORMAL   History of melanoma excision    nonmalignant and malignant    History of nonmelanoma skin cancer dermatologist-  dr williford   multiple areas on body,  SCC majority   Hyperlipidemia    Hypertension    IBS (irritable bowel syndrome)    Prolapsed internal hemorrhoids, grade 4     Past Surgical History:  Procedure Laterality Date   COLONOSCOPY  last one 11-10-2014  dr pyrtle   EVALUATION UNDER ANESTHESIA WITH HEMORRHOIDECTOMY N/A 10/17/2017   Procedure: EXAM UNDER ANESTHESIA WITH HEMORRHOIDECTOMY AND HEMORRHOIDAL LIGATION/PEXY TIMES TWO;  Surgeon: Karie Soda, MD;  Location: [facility];  Service: General;  Laterality: N/A;   EXCISION SEBACEOUS CYST  10-18-2013   @[facility]    ON BACK   FLAP CLOSURE LEFT CHEEK POST WLE  09-18-2012    @[facility]    MOHS SURGERY  X2 2010   right ear   Right Foot Surgery  02/25/2023   WIDE LOCAL EXCISION LEFT CHEEK AND LEFT CERVICAL SENITNAL LYMPH NODE BX  09-07-2012    @[facility]     Family History  Problem Relation Age of Onset   COPD Mother    Emphysema Mother    Stroke Mother        X 2   Heart disease Mother  CABG in her 3s   Hyperlipidemia Mother    Hypertension Mother    Diabetes Mother        type 2   Cancer Father        melanoma   Stroke Father        1992   Hypertension Father    Alcohol abuse Father    Heart disease Father    Diabetes Maternal Grandmother        type 2   Heart disease Maternal Grandmother        CHF   Heart attack Maternal Grandfather    Heart disease Paternal Grandmother    Heart disease Paternal Grandfather    Colon cancer Neg Hx    Colon polyps Neg Hx    Esophageal cancer Neg Hx    Rectal cancer Neg Hx    Stomach cancer Neg Hx     Social History   Socioeconomic History   Marital status: Married    Spouse name: Marcelino Duster    Number of children: 2   Years of education: Not on file   Highest education level: 11th grade  Occupational History   Occupation: Landscape  Tobacco Use   Smoking status: Never   Smokeless tobacco: Never  Vaping Use   Vaping status: Never Used  Substance and Sexual Activity   Alcohol use: Yes    Alcohol/week: 0.0 standard drinks of alcohol    Comment: OCCASIONAL   Drug use: No   Sexual activity: Not on file  Other Topics Concern   Not on file  Social History Narrative   Not on file   Social Drivers of Health   Financial Resource Strain: Medium Risk (12/22/2022)   Overall Financial Resource Strain (CARDIA)    Difficulty of Paying Living Expenses: Somewhat hard  Food Insecurity: No Food Insecurity (12/22/2022)   Hunger Vital Sign    Worried About Running Out of Food in the Last Year: Never true    Ran Out of Food in the Last Year: Never true  Transportation Needs: No Transportation Needs (12/22/2022)   PRAPARE - Administrator, Civil Service (Medical): No    Lack of Transportation (Non-Medical): No  Physical Activity: Inactive (12/22/2022)   Exercise Vital Sign    Days of Exercise per Week: 7 days    Minutes of Exercise per Session: 0 min  Stress: No Stress Concern Present (12/22/2022)   [employer] of Occupational Health - Occupational Stress Questionnaire    Feeling of Stress : Only a little  Social Connections: Socially Isolated (12/22/2022)   Social Connection and Isolation Panel [NHANES]    Frequency of Communication with Friends and Family: Once a week    Frequency of Social Gatherings with Friends and Family: Never    Attends Religious Services: Never    Database administrator or Organizations: No    Attends Engineer, structural: Not on file    Marital Status: Married  Catering manager Violence: Not on file    Outpatient Medications Prior to Visit  Medication Sig Dispense Refill   amLODipine (NORVASC) 5 MG tablet Take 1 tablet by mouth  once daily 30 tablet 0   buPROPion (WELLBUTRIN XL) 300 MG 24 hr tablet Take 1 tablet (300 mg total) by mouth daily. 30 tablet 5   doxycycline (VIBRA-TABS) 100 MG tablet Take 1 tablet (100 mg total) by mouth 2 (two) times daily. 20 tablet 0   EQ ALLERGY RELIEF, CETIRIZINE, 10 MG tablet Take 1  tablet by mouth once daily 90 tablet 0   ezetimibe (ZETIA) 10 MG tablet Take 1 tablet by mouth once daily 90 tablet 0   famotidine (PEPCID) 40 MG tablet Take 1 tablet by mouth once daily 90 tablet 0   fenofibrate 54 MG tablet TAKE 1 TABLET BY MOUTH ONCE DAILY WITH SUPPER 90 tablet 2   FLUoxetine (PROZAC) 20 MG capsule Take 1 capsule (20 mg total) by mouth daily. 90 capsule 1   hydrOXYzine (ATARAX) 10 MG tablet Take 1 tablet (10 mg total) by mouth 2 (two) times daily as needed. 40 tablet 1   meloxicam (MOBIC) 15 MG tablet Take 1 tablet by mouth once daily 90 tablet 0   oxyCODONE-acetaminophen (PERCOCET) 10-325 MG tablet Take 1 tablet by mouth every 8 (eight) hours as needed for pain. 15 tablet 0   No facility-administered medications prior to visit.    Allergies  Allergen Reactions   Crestor [Rosuvastatin] Other (See Comments)    myalgias    Review of Systems  Constitutional:  Negative for fever and malaise/fatigue.  HENT:  Negative for congestion.   Eyes:  Negative for blurred vision.  Respiratory:  Negative for shortness of breath.   Cardiovascular:  Negative for chest pain, palpitations and leg swelling.  Gastrointestinal:  Negative for abdominal pain, blood in stool and nausea.  Genitourinary:  Negative for dysuria and frequency.  Musculoskeletal:  Positive for joint pain. Negative for falls.  Skin:  Negative for rash.  Neurological:  Negative for dizziness, loss of consciousness and headaches.  Endo/Heme/Allergies:  Negative for environmental allergies.  Psychiatric/Behavioral:  Negative for depression. The patient is not nervous/anxious.        Objective:    Physical  Exam Constitutional:      General: He is not in acute distress.    Appearance: Normal appearance. He is not ill-appearing or toxic-appearing.  HENT:     Head: Normocephalic and atraumatic.     Right Ear: External ear normal.     Left Ear: External ear normal.     Nose: Nose normal.  Eyes:     General:        Right eye: No discharge.        Left eye: No discharge.  Pulmonary:     Effort: Pulmonary effort is normal.  Skin:    Findings: No rash.  Neurological:     Mental Status: He is alert and oriented to person, place, and time.  Psychiatric:        Behavior: Behavior normal.     BP (!) 137/92   Pulse 86  Wt Readings from Last 3 Encounters:  03/03/23 158 lb 2 oz (71.7 kg)  12/23/22 155 lb 9.6 oz (70.6 kg)  06/11/21 165 lb 12.8 oz (75.2 kg)       Assessment & Plan:  Essential hypertension Assessment & Plan: Monitor and report any concerns, no changes to meds. Encouraged heart healthy diet such as the DASH diet and exercise as tolerated.     Heterozygous familial hypercholesterolemia Assessment & Plan: Encourage heart healthy diet such as MIND or DASH diet, increase exercise, avoid trans fats, simple carbohydrates and processed foods, consider a krill or fish or flaxseed oil cap daily.        Assessment and Plan    Post-Operative Right Foot Successful surgery with no complications or signs of infection. -No further action required at this time.  Chronic Pain Positive ANA suggesting possible autoimmune etiology. Pain is currently manageable and patient  declined rheumatology referral. -Encouraged hydration, physical activity, and exercise to manage pain and slow progression of potential autoimmune condition.  General Health Maintenance / Followup Plans -Schedule in-person visit and blood work in six months. -If any changes or concerns arise before the next scheduled visit, patient will contact the office.         I discussed the assessment and treatment plan  with the patient. The patient was provided an opportunity to ask questions and all were answered. The patient agreed with the plan and demonstrated an understanding of the instructions.   The patient was advised to call back or seek an in-person evaluation if the symptoms worsen or if the condition fails to improve as anticipated.  Danise Edge, MD [facility] [phone] (phone) [phone] (fax)  [facility]

## 2023-03-14 NOTE — Progress Notes (Signed)
Addendum: Reviewed and agree with assessment and management plan. Kadijah Shamoon M, MD  

## 2023-03-17 ENCOUNTER — Encounter: Payer: Medicare Other | Admitting: Podiatry

## 2023-03-28 ENCOUNTER — Other Ambulatory Visit: Payer: Self-pay | Admitting: Family Medicine

## 2023-03-31 ENCOUNTER — Ambulatory Visit (INDEPENDENT_AMBULATORY_CARE_PROVIDER_SITE_OTHER): Payer: Medicare Other

## 2023-03-31 ENCOUNTER — Ambulatory Visit (INDEPENDENT_AMBULATORY_CARE_PROVIDER_SITE_OTHER): Payer: Medicare Other | Admitting: Podiatry

## 2023-03-31 ENCOUNTER — Ambulatory Visit: Payer: Medicare Other | Admitting: Family Medicine

## 2023-03-31 ENCOUNTER — Encounter: Payer: Self-pay | Admitting: Podiatry

## 2023-03-31 DIAGNOSIS — Z9889 Other specified postprocedural states: Secondary | ICD-10-CM

## 2023-04-01 NOTE — Progress Notes (Signed)
 Subjective:   Patient ID: Aaron Dyer [place], male   DOB: 58 y.o.   MRN: [id number]   HPI Patient presents stating that overall doing very well wearing shoes   ROS      Objective:  Physical Exam  Neuro vas scaler status intact negative Toula' sign noted right foot healing well wound edges well coapted range of motion excellent no crepitus     Assessment:  Doing well post osteotomy right first metatarsal with hallux limitus correction      Plan:  H&P reviewed and went ahead today and applied ankle compression stocking continue elevation encouraged range of motion reappoint to recheck  X-rays indicate osteotomies healing well fixation in place joint congruence

## 2023-04-15 DIAGNOSIS — L578 Other skin changes due to chronic exposure to nonionizing radiation: Secondary | ICD-10-CM | POA: Diagnosis not present

## 2023-04-15 DIAGNOSIS — L570 Actinic keratosis: Secondary | ICD-10-CM | POA: Diagnosis not present

## 2023-04-15 DIAGNOSIS — D229 Melanocytic nevi, unspecified: Secondary | ICD-10-CM | POA: Diagnosis not present

## 2023-04-15 DIAGNOSIS — L821 Other seborrheic keratosis: Secondary | ICD-10-CM | POA: Diagnosis not present

## 2023-04-15 DIAGNOSIS — L814 Other melanin hyperpigmentation: Secondary | ICD-10-CM | POA: Diagnosis not present

## 2023-05-27 ENCOUNTER — Other Ambulatory Visit: Payer: Self-pay | Admitting: Family

## 2023-05-27 ENCOUNTER — Other Ambulatory Visit: Payer: Self-pay | Admitting: Family Medicine

## 2023-06-04 ENCOUNTER — Other Ambulatory Visit: Payer: Self-pay | Admitting: *Deleted

## 2023-06-16 ENCOUNTER — Encounter: Payer: Self-pay | Admitting: Family Medicine

## 2023-06-17 ENCOUNTER — Other Ambulatory Visit: Payer: Self-pay | Admitting: Emergency Medicine

## 2023-06-17 DIAGNOSIS — E785 Hyperlipidemia, unspecified: Secondary | ICD-10-CM

## 2023-06-17 DIAGNOSIS — F418 Other specified anxiety disorders: Secondary | ICD-10-CM

## 2023-06-17 MED ORDER — FAMOTIDINE 40 MG PO TABS: 40.0000 mg | ORAL_TABLET | Freq: Every day | ORAL | 0 refills | Status: DC

## 2023-06-17 MED ORDER — FLUOXETINE HCL 20 MG PO CAPS: 20.0000 mg | ORAL_CAPSULE | Freq: Every day | ORAL | 1 refills | Status: DC

## 2023-06-17 MED ORDER — CETIRIZINE HCL 10 MG PO TABS: 10.0000 mg | ORAL_TABLET | Freq: Every day | ORAL | 0 refills | Status: DC

## 2023-06-17 MED ORDER — EZETIMIBE 10 MG PO TABS: 10.0000 mg | ORAL_TABLET | Freq: Every day | ORAL | 1 refills | Status: DC

## 2023-06-17 MED ORDER — MELOXICAM 15 MG PO TABS: 15.0000 mg | ORAL_TABLET | Freq: Every day | ORAL | 0 refills | Status: DC

## 2023-06-17 MED ORDER — AMLODIPINE BESYLATE 5 MG PO TABS: 5.0000 mg | ORAL_TABLET | Freq: Every day | ORAL | 1 refills | Status: DC

## 2023-08-11 ENCOUNTER — Other Ambulatory Visit: Payer: Self-pay | Admitting: Family Medicine

## 2023-08-14 ENCOUNTER — Ambulatory Visit: Payer: Self-pay

## 2023-08-14 NOTE — Telephone Encounter (Signed)
 Information obtained from the wife Moira Andrews.   Chief Complaint: rib injury after a fall Symptoms: pain Frequency: about 2 days Pertinent Negatives: Patient denies SOB at rest Disposition: [] ED /[x] Urgent Care (no appt availability in office) / [] Appointment(In office/virtual)/ []  [facility]/ [] Home Care/ [] Refused Recommended Disposition /[] [facility]/ []  Follow-up with PCP Additional Notes: Pt fell from truck and has been having pain in his chest since the fall. Copied from CRM [id number]. Topic: Clinical - Red Word Triage >> Aug 14, 2023  4:23 PM Chuck Crater wrote: Red Word that prompted transfer to Nurse Triage: Patient fell off the tailgate of a truck and could possibly have a broken rib. Reason for Disposition  [1] MODERATE pain (e.g., interferes with normal activities) AND [2] high-risk adult (e.g., age > 60 years, osteoporosis, chronic steroid use)  Answer Assessment - Initial Assessment Questions 1. MECHANISM: "How did the injury happen?"     Fell off truck 2. ONSET: "When did the injury happen?" (Minutes or hours ago)     2 days ago 3. LOCATION: "Where on the chest is the injury located?"     Rib area 4. APPEARANCE: "What does the injury look like?"     denies 5. BLEEDING: "Is there any bleeding now? If Yes, ask: How long has it been bleeding?"     denies 6. SEVERITY: "Any difficulty with breathing?"     Only with movement 7. SIZE: For cuts, bruises, or swelling, ask: "How large is it?" (e.g., inches or centimeters)     denies 8. PAIN: "Is there pain?" If Yes, ask: "How bad is the pain?"   (e.g., Scale 1-10; or mild, moderate, severe)     7  Protocols used: Chest Injury-A-AH

## 2023-08-24 NOTE — Assessment & Plan Note (Signed)
 Does not tolerate statins.

## 2023-08-24 NOTE — Assessment & Plan Note (Signed)
 Last colonoscopy 2022 repeat in 2029

## 2023-08-24 NOTE — Assessment & Plan Note (Signed)
 Recurrent, follows closely with dermatology

## 2023-08-24 NOTE — Assessment & Plan Note (Signed)
 Monitor and report any concerns, no changes to meds. Encouraged heart healthy diet such as the DASH diet and exercise as tolerated.  ?

## 2023-08-25 ENCOUNTER — Ambulatory Visit (INDEPENDENT_AMBULATORY_CARE_PROVIDER_SITE_OTHER): Payer: Medicare Other | Admitting: Family Medicine

## 2023-08-25 ENCOUNTER — Ambulatory Visit: Payer: Self-pay | Admitting: Family Medicine

## 2023-08-25 ENCOUNTER — Ambulatory Visit: Payer: Medicare Other | Admitting: Family Medicine

## 2023-08-25 VITALS — BP 136/82 | HR 68 | Resp 16 | Ht 69.0 in | Wt 156.8 lb

## 2023-08-25 DIAGNOSIS — E785 Hyperlipidemia, unspecified: Secondary | ICD-10-CM

## 2023-08-25 DIAGNOSIS — G720 Drug-induced myopathy: Secondary | ICD-10-CM

## 2023-08-25 DIAGNOSIS — T466X5A Adverse effect of antihyperlipidemic and antiarteriosclerotic drugs, initial encounter: Secondary | ICD-10-CM

## 2023-08-25 DIAGNOSIS — W57XXXA Bitten or stung by nonvenomous insect and other nonvenomous arthropods, initial encounter: Secondary | ICD-10-CM

## 2023-08-25 DIAGNOSIS — K635 Polyp of colon: Secondary | ICD-10-CM | POA: Diagnosis not present

## 2023-08-25 DIAGNOSIS — Z1159 Encounter for screening for other viral diseases: Secondary | ICD-10-CM | POA: Diagnosis not present

## 2023-08-25 DIAGNOSIS — C4490 Unspecified malignant neoplasm of skin, unspecified: Secondary | ICD-10-CM

## 2023-08-25 DIAGNOSIS — I10 Essential (primary) hypertension: Secondary | ICD-10-CM | POA: Diagnosis not present

## 2023-08-25 LAB — LIPID PANEL
Cholesterol: 214 mg/dL — ABNORMAL HIGH (ref 0–200)
HDL: 44.2 mg/dL (ref >39.00)
LDL Cholesterol: 145 mg/dL — ABNORMAL HIGH (ref 0–99)
NonHDL: 169.77
Total CHOL/HDL Ratio: 5
Triglycerides: 125 mg/dL (ref 0.0–149.0)
VLDL: 25 mg/dL (ref 0.0–40.0)

## 2023-08-25 LAB — COMPREHENSIVE METABOLIC PANEL WITH GFR
ALT: 18 U/L (ref 0–53)
AST: 15 U/L (ref 0–37)
Albumin: 5 g/dL (ref 3.5–5.2)
Alkaline Phosphatase: 110 U/L (ref 39–117)
BUN: 14 mg/dL (ref 6–23)
CO2: 30 meq/L (ref 19–32)
Calcium: 9.9 mg/dL (ref 8.4–10.5)
Chloride: 102 meq/L (ref 96–112)
Creatinine, Ser: 0.9 mg/dL (ref 0.40–1.50)
GFR: 94.09 mL/min (ref >60.00)
Glucose, Bld: 94 mg/dL (ref 70–99)
Potassium: 4.8 meq/L (ref 3.5–5.1)
Sodium: 138 meq/L (ref 135–145)
Total Bilirubin: 0.5 mg/dL (ref 0.2–1.2)
Total Protein: 7.4 g/dL (ref 6.0–8.3)

## 2023-08-25 LAB — CBC WITH DIFFERENTIAL/PLATELET
Basophils Absolute: 0.1 10*3/uL (ref 0.0–0.1)
Basophils Relative: 0.9 % (ref 0.0–3.0)
Eosinophils Absolute: 0.2 10*3/uL (ref 0.0–0.7)
Eosinophils Relative: 2 % (ref 0.0–5.0)
HCT: 44.5 % (ref 39.0–52.0)
Hemoglobin: 14.8 g/dL (ref 13.0–17.0)
Lymphocytes Relative: 33.5 % (ref 12.0–46.0)
Lymphs Abs: 2.9 10*3/uL (ref 0.7–4.0)
MCHC: 33.3 g/dL (ref 30.0–36.0)
MCV: 93.3 fl (ref 78.0–100.0)
Monocytes Absolute: 0.5 10*3/uL (ref 0.1–1.0)
Monocytes Relative: 5.6 % (ref 3.0–12.0)
Neutro Abs: 5 10*3/uL (ref 1.4–7.7)
Neutrophils Relative %: 58 % (ref 43.0–77.0)
Platelets: 323 10*3/uL (ref 150.0–400.0)
RBC: 4.77 Mil/uL (ref 4.22–5.81)
RDW: 14.1 % (ref 11.5–15.5)
WBC: 8.5 10*3/uL (ref 4.0–10.5)

## 2023-08-25 LAB — TSH: TSH: 2.66 u[IU]/mL (ref 0.35–5.50)

## 2023-08-25 MED ORDER — FLUOXETINE HCL 10 MG PO TABS: 30.0000 mg | ORAL_TABLET | Freq: Every day | ORAL | 1 refills | Status: DC

## 2023-08-25 MED ORDER — HYDROXYZINE PAMOATE 25 MG PO CAPS: 25.0000 mg | ORAL_CAPSULE | Freq: Every evening | ORAL | 2 refills | Status: AC | PRN

## 2023-08-25 NOTE — Progress Notes (Addendum)
 "  Subjective:    Patient ID: Aaron Dyer, male    DOB: [birth year], 58 y.o.   MRN: [id number]  No chief complaint on file.   HPI Discussed the use of AI scribe software for clinical note transcription with the patient, who gave verbal consent to proceed.  History of Present Illness Graeme Menees is a 58 year old male with depression and anxiety who presents for management of mood symptoms and medication adjustment.  He has ongoing struggles with depression and anxiety, describing his mood as 'real blue, real low' and often reflecting on past experiences, including childhood. He acknowledges stress but does not find it overwhelming. He is currently taking fluoxetine  20 mg and Wellbutrin  300 mg for mood management.  A couple of weeks ago, he experienced a tick bite with one tick on his leg possibly attached for more than 24 hours. No bull's eye rash, headaches, fevers, or body aches following the bite. Blood work was ordered to check for tick-borne illnesses.  On May 22nd, he fell off the back of a truck, resulting in pain in his back and shoulder when moving, coughing, and sneezing. He did not seek immediate medical attention but reports significant improvement in pain since the incident.  He experiences sleep disturbances, getting only two to three hours of sleep per night. He previously tried hydroxyzine  for sleep, taking up to 30 mg without success. 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    Past Medical History:  Diagnosis Date   Anxiety and depression    Arthritis    neck   Diverticulosis of colon without hemorrhage 12/18/2014   ED (erectile dysfunction)    GERD (gastroesophageal reflux disease)    Hemorrhoids    History of adenomatous polyp of colon    History of adenomatous polyp of colon    History of exercise stress test 06/09/2006   ECHO ETT-- NORMAL   History of melanoma excision     nonmalignant and malignant    History of nonmelanoma skin cancer dermatologist-  dr williford   multiple areas on body,  SCC majority   Hyperlipidemia    Hypertension    IBS (irritable bowel syndrome)    Prolapsed internal hemorrhoids, grade 4     Past Surgical History:  Procedure Laterality Date   COLONOSCOPY  last one 11-10-2014  dr pyrtle   EVALUATION UNDER ANESTHESIA WITH HEMORRHOIDECTOMY N/A 10/17/2017   Procedure: EXAM UNDER ANESTHESIA WITH HEMORRHOIDECTOMY AND HEMORRHOIDAL LIGATION/PEXY TIMES TWO;  Surgeon: Sheldon Standing, MD;  Location: [facility];  Service: General;  Laterality: N/A;   EXCISION SEBACEOUS CYST  10-18-2013   @[facility]    ON BACK   FLAP CLOSURE LEFT CHEEK POST WLE  09-18-2012    @[facility]    MOHS SURGERY  X2 2010   right ear   Right Foot Surgery  02/25/2023   WIDE LOCAL EXCISION LEFT CHEEK AND LEFT CERVICAL SENITNAL LYMPH NODE BX  09-07-2012    @[facility]     Family History  Problem Relation Age of Onset   COPD Mother    Emphysema Mother    Stroke Mother        X 2   Heart disease Mother        CABG in her 36s   Hyperlipidemia Mother    Hypertension Mother    Diabetes Mother        type 2   Cancer Father  melanoma   Stroke Father        1992   Hypertension Father    Alcohol abuse Father    Heart disease Father    Diabetes Maternal Grandmother        type 2   Heart disease Maternal Grandmother        CHF   Heart attack Maternal Grandfather    Heart disease Paternal Grandmother    Heart disease Paternal Grandfather    Colon cancer Neg Hx    Colon polyps Neg Hx    Esophageal cancer Neg Hx    Rectal cancer Neg Hx    Stomach cancer Neg Hx     Social History   Socioeconomic History   Marital status: Married    Spouse name: Rosaline   Number of children: 2   Years of education: Not on file   Highest education level: 11th grade  Occupational History   Occupation: Landscape  Tobacco Use    Smoking status: Never   Smokeless tobacco: Never  Vaping Use   Vaping status: Never Used  Substance and Sexual Activity   Alcohol use: Yes    Alcohol/week: 0.0 standard drinks of alcohol    Comment: OCCASIONAL   Drug use: No   Sexual activity: Not on file  Other Topics Concern   Not on file  Social History Narrative   Not on file   Social Drivers of Health   Financial Resource Strain: Low Risk  (08/25/2023)   Overall Financial Resource Strain (CARDIA)    Difficulty of Paying Living Expenses: Not very hard  Food Insecurity: No Food Insecurity (08/25/2023)   Hunger Vital Sign    Worried About Running Out of Food in the Last Year: Never true    Ran Out of Food in the Last Year: Never true  Transportation Needs: No Transportation Needs (08/25/2023)   PRAPARE - Administrator, Civil Service (Medical): No    Lack of Transportation (Non-Medical): No  Physical Activity: Sufficiently Active (08/25/2023)   Exercise Vital Sign    Days of Exercise per Week: 5 days    Minutes of Exercise per Session: 40 min  Stress: Stress Concern Present (08/25/2023)   [employer] of Occupational Health - Occupational Stress Questionnaire    Feeling of Stress : Very much  Social Connections: Socially Isolated (08/25/2023)   Social Connection and Isolation Panel [NHANES]    Frequency of Communication with Friends and Family: Once a week    Frequency of Social Gatherings with Friends and Family: Never    Attends Religious Services: Never    Database Administrator or Organizations: No    Attends Engineer, Structural: Not on file    Marital Status: Married  Catering Manager Violence: Not on file    Outpatient Medications Prior to Visit  Medication Sig Dispense Refill   amLODipine  (NORVASC ) 5 MG tablet Take 1 tablet (5 mg total) by mouth daily. 90 tablet 1   buPROPion  (WELLBUTRIN  XL) 300 MG 24 hr tablet TAKE ONE TABLET BY MOUTH DAILY 30 tablet 2   cetirizine  (EQ  ALLERGY RELIEF, CETIRIZINE ,) 10 MG tablet Take 1 tablet (10 mg total) by mouth daily. 90 tablet 0   ezetimibe  (ZETIA ) 10 MG tablet Take 1 tablet (10 mg total) by mouth daily. 90 tablet 1   famotidine  (PEPCID ) 40 MG tablet Take 1 tablet (40 mg total) by mouth daily. 90 tablet 0   fenofibrate  54 MG tablet TAKE 1 TABLET BY MOUTH  ONCE DAILY WITH SUPPER 90 tablet 2   meloxicam  (MOBIC ) 15 MG tablet Take 1 tablet (15 mg total) by mouth daily. 90 tablet 0   FLUoxetine  (PROZAC ) 20 MG capsule Take 1 capsule (20 mg total) by mouth daily. 90 capsule 1   hydrOXYzine  (ATARAX ) 10 MG tablet Take 1 tablet (10 mg total) by mouth 2 (two) times daily as needed. 40 tablet 1   doxycycline  (VIBRA -TABS) 100 MG tablet Take 1 tablet (100 mg total) by mouth 2 (two) times daily. 20 tablet 0   oxyCODONE -acetaminophen  (PERCOCET) 10-325 MG tablet Take 1 tablet by mouth every 8 (eight) hours as needed for pain. (Patient not taking: Reported on 08/25/2023) 15 tablet 0   No facility-administered medications prior to visit.    Allergies  Allergen Reactions   Crestor  [Rosuvastatin ] Other (See Comments)    myalgias    Review of Systems  Constitutional:  Negative for fever and malaise/fatigue.  HENT:  Negative for congestion.   Eyes:  Negative for blurred vision.  Respiratory:  Negative for shortness of breath.   Cardiovascular:  Negative for chest pain, palpitations and leg swelling.  Gastrointestinal:  Negative for abdominal pain, blood in stool and nausea.  Genitourinary:  Negative for dysuria and frequency.  Musculoskeletal:  Positive for back pain and joint pain. Negative for falls.  Skin:  Negative for rash.  Neurological:  Negative for dizziness, loss of consciousness and headaches.  Endo/Heme/Allergies:  Negative for environmental allergies.  Psychiatric/Behavioral:  Positive for depression. The patient is nervous/anxious and has insomnia.        Objective:     Physical Exam Vitals reviewed.   Constitutional:      Appearance: Normal appearance. He is not ill-appearing.  HENT:     Head: Normocephalic and atraumatic.     Nose: Nose normal.  Eyes:     Conjunctiva/sclera: Conjunctivae normal.  Cardiovascular:     Rate and Rhythm: Normal rate.     Pulses: Normal pulses.     Heart sounds: Normal heart sounds. No murmur heard. Pulmonary:     Effort: Pulmonary effort is normal.     Breath sounds: Normal breath sounds. No wheezing.  Abdominal:     Palpations: Abdomen is soft. There is no mass.     Tenderness: There is no abdominal tenderness.  Musculoskeletal:     Cervical back: Normal range of motion.     Right lower leg: No edema.     Left lower leg: No edema.  Skin:    General: Skin is warm and dry.  Neurological:     General: No focal deficit present.     Mental Status: He is alert and oriented to person, place, and time.  Psychiatric:        Mood and Affect: Mood normal.    BP 136/82   Pulse 68   Resp 16   Ht 5' 9 (1.753 m)   Wt 156 lb 12.8 oz (71.1 kg)   SpO2 96%   BMI 23.16 kg/m  Wt Readings from Last 3 Encounters:  08/25/23 156 lb 12.8 oz (71.1 kg)  03/03/23 158 lb 2 oz (71.7 kg)  12/23/22 155 lb 9.6 oz (70.6 kg)    Diabetic Foot Exam - Simple   No data filed    Lab Results  Component Value Date   WBC 9.3 12/23/2022   HGB 15.6 12/23/2022   HCT 46.8 12/23/2022   PLT 289.0 12/23/2022   GLUCOSE 98 12/23/2022   CHOL 213 (H) 12/23/2022  TRIG 106.0 12/23/2022   HDL 47.40 12/23/2022   LDLDIRECT 173.0 04/25/2021   LDLCALC 144 (H) 12/23/2022   ALT 17 12/23/2022   AST 15 12/23/2022   NA 138 12/23/2022   K 5.1 12/23/2022   CL 103 12/23/2022   CREATININE 0.89 12/23/2022   BUN 16 12/23/2022   CO2 29 12/23/2022   TSH 3.00 12/23/2022   PSA 0.54 06/10/2019   HGBA1C 5.6 06/10/2019    Lab Results  Component Value Date   TSH 3.00 12/23/2022   Lab Results  Component Value Date   WBC 9.3 12/23/2022   HGB 15.6 12/23/2022   HCT 46.8 12/23/2022    MCV 93.5 12/23/2022   PLT 289.0 12/23/2022   Lab Results  Component Value Date   NA 138 12/23/2022   K 5.1 12/23/2022   CO2 29 12/23/2022   GLUCOSE 98 12/23/2022   BUN 16 12/23/2022   CREATININE 0.89 12/23/2022   BILITOT 0.5 12/23/2022   ALKPHOS 82 12/23/2022   AST 15 12/23/2022   ALT 17 12/23/2022   PROT 7.2 12/23/2022   ALBUMIN 4.8 12/23/2022   CALCIUM  9.9 12/23/2022   GFR 94.86 12/23/2022   Lab Results  Component Value Date   CHOL 213 (H) 12/23/2022   Lab Results  Component Value Date   HDL 47.40 12/23/2022   Lab Results  Component Value Date   LDLCALC 144 (H) 12/23/2022   Lab Results  Component Value Date   TRIG 106.0 12/23/2022   Lab Results  Component Value Date   CHOLHDL 4 12/23/2022   Lab Results  Component Value Date   HGBA1C 5.6 06/10/2019       Assessment & Plan:  Essential hypertension Assessment & Plan: Monitor and report any concerns, no changes to meds. Encouraged heart healthy diet such as the DASH diet and exercise as tolerated.    Orders: -     Comprehensive metabolic panel with GFR -     CBC with Differential/Platelet  Statin myopathy Assessment & Plan: Does not tolerate statins   Skin cancer Assessment & Plan: Recurrent, follows closely with dermatology   Polyp of colon, unspecified part of colon, unspecified type Assessment & Plan: Last colonoscopy 2022 repeat in 2029   Tick bite, unspecified site, initial encounter -     Rocky mtn spotted fvr abs pnl(IgG+IgM) -     B. burgdorfi antibodies -     Ehrlichia antibody panel  Hyperlipidemia, unspecified hyperlipidemia type -     Lipid panel -     TSH  Need for hepatitis C screening test -     Hepatitis C antibody  Other orders -     FLUoxetine  HCl; Take 3 tablets (30 mg total) by mouth daily.  Dispense: 270 tablet; Refill: 1 -     hydrOXYzine  Pamoate; Take 1-2 capsules (25-50 mg total) by mouth at bedtime as needed.  Dispense: 60 capsule; Refill:  2    Assessment and Plan Assessment & Plan Depression Chronic low mood. Considered increasing fluoxetine  to improve mood. Counseling recommended for coping skills. - Increase fluoxetine  to 30 mg daily using three 10 mg tablets. - Discuss potential counseling options for managing past trauma and developing coping mechanisms. - Schedule a virtual follow-up in three months to assess the effectiveness of the increased fluoxetine  dosage.  Chronic Pain Chronic pain managed with meloxicam  for arthritis. Noted interaction with fluoxetine  may increase bleeding risk. - Continue meloxicam  for arthritis pain management. - Ensure both meloxicam  and fluoxetine  are taken with  food to reduce bleeding risk.  Rib Injury Symptoms improved, no x-ray planned due to radiation risks and symptom improvement. - Consider telehealth or urgent care options if symptoms worsen.  Tick Bite Recent tick bite with no symptoms of Lyme disease or other tick-borne illnesses. - Perform blood work to check for tick-borne diseases.  Skin Cancer History of squamous cell carcinoma, melanoma, and basal cell carcinoma. No new skin cancer diagnoses reported.      Harlene Horton, MD "

## 2023-08-27 ENCOUNTER — Other Ambulatory Visit: Payer: Self-pay | Admitting: Family Medicine

## 2023-08-27 ENCOUNTER — Telehealth: Payer: Self-pay | Admitting: Family Medicine

## 2023-08-27 MED ORDER — DOXYCYCLINE HYCLATE 100 MG PO TABS: 100.0000 mg | ORAL_TABLET | Freq: Two times a day (BID) | ORAL | 0 refills | Status: DC

## 2023-08-27 NOTE — Telephone Encounter (Signed)
 I called the pt to get a date for his tick bite that he discussed with Dr.Blyth for his insurance claim.

## 2023-08-28 MED ORDER — DOXYCYCLINE HYCLATE 100 MG PO TABS: 100.0000 mg | ORAL_TABLET | Freq: Two times a day (BID) | ORAL | 0 refills | Status: AC

## 2023-08-28 NOTE — Addendum Note (Signed)
 Addended by: Octavie Westerhold D on: 08/28/2023 09:59 AM   Modules accepted: Orders

## 2023-08-29 LAB — B. BURGDORFI ANTIBODIES BY WB
B burgdorferi IgG Abs (IB): NEGATIVE
B burgdorferi IgM Abs (IB): NEGATIVE
Lyme Disease 18 kD IgG: NONREACTIVE
Lyme Disease 23 kD IgG: NONREACTIVE
Lyme Disease 23 kD IgM: NONREACTIVE
Lyme Disease 28 kD IgG: NONREACTIVE
Lyme Disease 30 kD IgG: NONREACTIVE
Lyme Disease 39 kD IgG: REACTIVE — AB
Lyme Disease 39 kD IgM: NONREACTIVE
Lyme Disease 41 kD IgG: NONREACTIVE
Lyme Disease 41 kD IgM: NONREACTIVE
Lyme Disease 45 kD IgG: NONREACTIVE
Lyme Disease 58 kD IgG: NONREACTIVE
Lyme Disease 66 kD IgG: NONREACTIVE
Lyme Disease 93 kD IgG: NONREACTIVE

## 2023-08-29 LAB — ROCKY MTN SPOTTED FVR ABS PNL(IGG+IGM)
RMSF IgG: NOT DETECTED
RMSF IgM: NOT DETECTED

## 2023-08-29 LAB — LYME AB SCREEN RFLX: Lyme AB Screen: 1.08 {index} — ABNORMAL HIGH

## 2023-08-29 LAB — HEPATITIS C ANTIBODY: Hepatitis C Ab: NONREACTIVE

## 2023-08-29 LAB — EHRLICHIA ANTIBODY PANEL
E. CHAFFEENSIS AB IGG: <1:64 {titer}
E. CHAFFEENSIS AB IGM: <1:20 {titer}

## 2023-09-09 ENCOUNTER — Encounter: Payer: Self-pay | Admitting: Family Medicine

## 2023-09-22 ENCOUNTER — Other Ambulatory Visit: Payer: Self-pay | Admitting: Family Medicine

## 2023-10-22 ENCOUNTER — Other Ambulatory Visit: Payer: Self-pay | Admitting: Family Medicine

## 2023-11-14 ENCOUNTER — Other Ambulatory Visit: Payer: Self-pay | Admitting: Family Medicine

## 2023-11-14 DIAGNOSIS — E785 Hyperlipidemia, unspecified: Secondary | ICD-10-CM

## 2023-11-14 DIAGNOSIS — F418 Other specified anxiety disorders: Secondary | ICD-10-CM

## 2023-11-26 ENCOUNTER — Other Ambulatory Visit: Payer: Self-pay | Admitting: Family Medicine

## 2023-12-04 ENCOUNTER — Other Ambulatory Visit: Payer: Self-pay

## 2023-12-04 DIAGNOSIS — Z125 Encounter for screening for malignant neoplasm of prostate: Secondary | ICD-10-CM

## 2023-12-04 DIAGNOSIS — I10 Essential (primary) hypertension: Secondary | ICD-10-CM

## 2023-12-04 DIAGNOSIS — E782 Mixed hyperlipidemia: Secondary | ICD-10-CM

## 2023-12-08 ENCOUNTER — Other Ambulatory Visit (INDEPENDENT_AMBULATORY_CARE_PROVIDER_SITE_OTHER)

## 2023-12-08 ENCOUNTER — Telehealth: Payer: Self-pay

## 2023-12-08 ENCOUNTER — Ambulatory Visit: Payer: Self-pay | Admitting: Family Medicine

## 2023-12-08 DIAGNOSIS — I10 Essential (primary) hypertension: Secondary | ICD-10-CM

## 2023-12-08 DIAGNOSIS — Z125 Encounter for screening for malignant neoplasm of prostate: Secondary | ICD-10-CM | POA: Diagnosis not present

## 2023-12-08 DIAGNOSIS — E782 Mixed hyperlipidemia: Secondary | ICD-10-CM

## 2023-12-08 DIAGNOSIS — E875 Hyperkalemia: Secondary | ICD-10-CM

## 2023-12-08 LAB — CBC WITH DIFFERENTIAL/PLATELET
Basophils Absolute: 0.1 K/uL (ref 0.0–0.1)
Basophils Relative: 0.9 % (ref 0.0–3.0)
Eosinophils Absolute: 0.2 K/uL (ref 0.0–0.7)
Eosinophils Relative: 2.1 % (ref 0.0–5.0)
HCT: 45.6 % (ref 39.0–52.0)
Hemoglobin: 15 g/dL (ref 13.0–17.0)
Lymphocytes Relative: 35.2 % (ref 12.0–46.0)
Lymphs Abs: 3.1 K/uL (ref 0.7–4.0)
MCHC: 32.8 g/dL (ref 30.0–36.0)
MCV: 94.9 fl (ref 78.0–100.0)
Monocytes Absolute: 0.5 K/uL (ref 0.1–1.0)
Monocytes Relative: 5.8 % (ref 3.0–12.0)
Neutro Abs: 4.9 K/uL (ref 1.4–7.7)
Neutrophils Relative %: 56 % (ref 43.0–77.0)
Platelets: 292 K/uL (ref 150.0–400.0)
RBC: 4.81 Mil/uL (ref 4.22–5.81)
RDW: 14.5 % (ref 11.5–15.5)
WBC: 8.8 K/uL (ref 4.0–10.5)

## 2023-12-08 LAB — TSH: TSH: 2.39 u[IU]/mL (ref 0.35–5.50)

## 2023-12-08 LAB — COMPREHENSIVE METABOLIC PANEL WITH GFR
ALT: 15 U/L (ref 0–53)
AST: 16 U/L (ref 0–37)
Albumin: 4.9 g/dL (ref 3.5–5.2)
Alkaline Phosphatase: 85 U/L (ref 39–117)
BUN: 18 mg/dL (ref 6–23)
CO2: 29 meq/L (ref 19–32)
Calcium: 10.2 mg/dL (ref 8.4–10.5)
Chloride: 103 meq/L (ref 96–112)
Creatinine, Ser: 0.98 mg/dL (ref 0.40–1.50)
GFR: 84.78 mL/min (ref >60.00)
Glucose, Bld: 96 mg/dL (ref 70–99)
Potassium: 6 meq/L (ref 3.5–5.1)
Sodium: 140 meq/L (ref 135–145)
Total Bilirubin: 0.6 mg/dL (ref 0.2–1.2)
Total Protein: 7.2 g/dL (ref 6.0–8.3)

## 2023-12-08 LAB — LIPID PANEL
Cholesterol: 211 mg/dL — ABNORMAL HIGH (ref 0–200)
HDL: 40.1 mg/dL (ref >39.00)
LDL Cholesterol: 145 mg/dL — ABNORMAL HIGH (ref 0–99)
NonHDL: 170.93
Total CHOL/HDL Ratio: 5
Triglycerides: 131 mg/dL (ref 0.0–149.0)
VLDL: 26.2 mg/dL (ref 0.0–40.0)

## 2023-12-08 LAB — PSA: PSA: 0.88 ng/mL (ref 0.10–4.00)

## 2023-12-08 NOTE — Telephone Encounter (Signed)
 Patient was advised and verbalized understanding.

## 2023-12-08 NOTE — Telephone Encounter (Signed)
 CRITICAL VALUE STICKER  CRITICAL VALUE: Potassium 6.0  RECEIVER (on-site recipient of call): Harlene Schroeder, MD  DATE & TIME NOTIFIED: 12/08/2023  1410  MESSENGER (representative from lab): Hope Scales  MD NOTIFIED: Yes. Message sent to provider and CMA  TIME OF NOTIFICATION: 1410  RESPONSE:  TBD

## 2023-12-10 ENCOUNTER — Other Ambulatory Visit (INDEPENDENT_AMBULATORY_CARE_PROVIDER_SITE_OTHER)

## 2023-12-10 ENCOUNTER — Ambulatory Visit: Payer: Self-pay | Admitting: Family

## 2023-12-10 DIAGNOSIS — E875 Hyperkalemia: Secondary | ICD-10-CM

## 2023-12-10 LAB — COMPREHENSIVE METABOLIC PANEL WITH GFR
ALT: 15 U/L (ref 0–53)
AST: 14 U/L (ref 0–37)
Albumin: 4.7 g/dL (ref 3.5–5.2)
Alkaline Phosphatase: 79 U/L (ref 39–117)
BUN: 15 mg/dL (ref 6–23)
CO2: 30 meq/L (ref 19–32)
Calcium: 9.8 mg/dL (ref 8.4–10.5)
Chloride: 102 meq/L (ref 96–112)
Creatinine, Ser: 0.89 mg/dL (ref 0.40–1.50)
GFR: 94.21 mL/min (ref >60.00)
Glucose, Bld: 87 mg/dL (ref 70–99)
Potassium: 5.1 meq/L (ref 3.5–5.1)
Sodium: 139 meq/L (ref 135–145)
Total Bilirubin: 0.5 mg/dL (ref 0.2–1.2)
Total Protein: 7 g/dL (ref 6.0–8.3)

## 2023-12-14 NOTE — Assessment & Plan Note (Deleted)
 Hydrate and monitor

## 2023-12-14 NOTE — Assessment & Plan Note (Deleted)
With some straining and blood noted on tissue at times. Encouraged increased hydration a fiber supplement twice daily, probiotics and Miralax daily or as needed. 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

## 2023-12-14 NOTE — Assessment & Plan Note (Deleted)
 Monitor and report any concerns, no changes to meds. Encouraged heart healthy diet such as the DASH diet and exercise as tolerated.  ?

## 2023-12-14 NOTE — Assessment & Plan Note (Deleted)
 Does not tolerate statins.

## 2023-12-17 ENCOUNTER — Other Ambulatory Visit: Payer: Self-pay | Admitting: Family Medicine

## 2023-12-17 ENCOUNTER — Other Ambulatory Visit: Payer: Self-pay | Admitting: Family

## 2023-12-18 ENCOUNTER — Telehealth: Admitting: Family Medicine

## 2023-12-18 DIAGNOSIS — M791 Myalgia, unspecified site: Secondary | ICD-10-CM

## 2023-12-18 DIAGNOSIS — T466X5A Adverse effect of antihyperlipidemic and antiarteriosclerotic drugs, initial encounter: Secondary | ICD-10-CM

## 2023-12-18 DIAGNOSIS — I10 Essential (primary) hypertension: Secondary | ICD-10-CM

## 2023-12-18 DIAGNOSIS — K5900 Constipation, unspecified: Secondary | ICD-10-CM

## 2023-12-29 ENCOUNTER — Encounter: Payer: Self-pay | Admitting: Family Medicine

## 2023-12-29 ENCOUNTER — Other Ambulatory Visit: Payer: Self-pay | Admitting: Family Medicine

## 2023-12-29 MED ORDER — MELOXICAM 15 MG PO TABS: 15.0000 mg | ORAL_TABLET | Freq: Every day | ORAL | 1 refills | Status: DC

## 2024-01-26 ENCOUNTER — Telehealth: Payer: Self-pay | Admitting: Family Medicine

## 2024-01-26 NOTE — Telephone Encounter (Signed)
 Copied from CRM #[id number]. Topic: General - Other >> Jan 26, 2024  4:22 PM Alexandria E wrote: Reason for CRM: Elenor with [employer] was returning a call for [facility] about a clearance form. Agent could not locate this phone call, please call Elenor back at [phone] when available.

## 2024-01-27 ENCOUNTER — Encounter: Payer: Self-pay | Admitting: Family Medicine

## 2024-01-27 NOTE — Telephone Encounter (Signed)
 Returned pt's calls and she advised no labs are needed for surgical clearance unless provider wants any before surgery, due to the type of surgery pt is having. Patient's last office visit was on 08/25/23 and last labs  12/08/23. Pre op form placed in folder to be signed.

## 2024-01-30 NOTE — Telephone Encounter (Signed)
Right shoulder

## 2024-02-03 ENCOUNTER — Other Ambulatory Visit: Payer: Self-pay | Admitting: Orthopedic Surgery

## 2024-02-03 DIAGNOSIS — M19011 Primary osteoarthritis, right shoulder: Secondary | ICD-10-CM

## 2024-02-06 ENCOUNTER — Ambulatory Visit
Admission: RE | Admit: 2024-02-06 | Discharge: 2024-02-06 | Disposition: A | Discharge status: Home / Self Care | Source: Ambulatory Visit | Attending: Orthopedic Surgery | Admitting: Orthopedic Surgery

## 2024-02-06 DIAGNOSIS — M19011 Primary osteoarthritis, right shoulder: Secondary | ICD-10-CM

## 2024-02-11 ENCOUNTER — Telehealth: Payer: Self-pay

## 2024-02-11 NOTE — Telephone Encounter (Signed)
 Copied from CRM #[id number]. Topic: General - Other >> Jan 26, 2024  4:22 PM Alexandria E wrote: Reason for CRM: Elenor with [employer] was returning a call for [facility] about a clearance form. Agent could not locate this phone call, please call Elenor back at [phone] when available. >> Feb 11, 2024  9:20 AM Pinkey ORN wrote: [facility] States she received the clearance and they're good to go.

## 2024-02-18 ENCOUNTER — Telehealth: Payer: Self-pay | Admitting: Family Medicine

## 2024-02-18 ENCOUNTER — Other Ambulatory Visit: Payer: Self-pay | Admitting: Family

## 2024-02-18 ENCOUNTER — Encounter: Payer: Self-pay | Admitting: Family Medicine

## 2024-02-18 NOTE — Telephone Encounter (Signed)
 Copied from CRM #[id number]. Topic: Medicare AWV >> Feb 18, 2024 10:14 AM Nathanel DEL wrote: Called LVM 02/18/2024 to sched AWV. Please schedule in office or virtual visit.   Nathanel Paschal; Care Guide Ambulatory Clinical Support [place] [facility] Direct Dial: [phone]

## 2024-02-20 ENCOUNTER — Other Ambulatory Visit: Payer: Self-pay | Admitting: Family

## 2024-02-20 MED ORDER — FLUOXETINE HCL 10 MG PO CAPS: 10.0000 mg | ORAL_CAPSULE | Freq: Every day | ORAL | 1 refills | Status: DC

## 2024-02-23 ENCOUNTER — Telehealth: Payer: Self-pay

## 2024-02-23 ENCOUNTER — Other Ambulatory Visit: Payer: Self-pay

## 2024-02-23 MED ORDER — FLUOXETINE HCL 10 MG PO CAPS: 30.0000 mg | ORAL_CAPSULE | Freq: Every day | ORAL | 1 refills | Status: DC

## 2024-02-23 MED ORDER — FLUOXETINE HCL 10 MG PO CAPS: 30.0000 mg | ORAL_CAPSULE | Freq: Every day | ORAL | 1 refills | Status: AC

## 2024-02-23 NOTE — Telephone Encounter (Signed)
 Copied from CRM #[id number]. Topic: Clinical - Prescription Issue >> Feb 23, 2024  8:56 AM Wess RAMAN wrote: Reason for CRM: Pharmacy stated the Prescription needs to be rewritten for FLUoxetine  (PROZAC ) 10 MG capsule. Patient takes them three times per day.  Pharmacy: [facility] [address] [place], [place] - [address] [place] [address] Phone: [phone] Fax: [phone] Hours: Not open 24 hours

## 2024-02-23 NOTE — Telephone Encounter (Signed)
 Correct qty was sent however wrong sig. Rx resent w/ corrected sig

## 2024-03-02 ENCOUNTER — Other Ambulatory Visit: Payer: Self-pay | Admitting: Family Medicine

## 2024-03-22 ENCOUNTER — Other Ambulatory Visit: Payer: Self-pay | Admitting: Family Medicine

## 2024-03-22 ENCOUNTER — Encounter: Payer: Self-pay | Admitting: Family Medicine

## 2024-03-26 ENCOUNTER — Other Ambulatory Visit: Payer: Self-pay | Admitting: Family Medicine

## 2024-04-23 ENCOUNTER — Other Ambulatory Visit: Payer: Self-pay | Admitting: Family Medicine

## 2024-06-21 ENCOUNTER — Ambulatory Visit: Admitting: Family Medicine
